# Patient Record
Sex: Male | Born: 1955 | Race: Black or African American | Hispanic: No | Marital: Single | Smoking: Never smoker
Health system: Southern US, Community
[De-identification: ages and names within clinical notes are randomized; demographics above are authoritative.]

## PROBLEM LIST (undated history)

## (undated) DIAGNOSIS — F419 Anxiety disorder, unspecified: Secondary | ICD-10-CM

## (undated) DIAGNOSIS — F200 Paranoid schizophrenia: Secondary | ICD-10-CM

## (undated) DIAGNOSIS — I639 Cerebral infarction, unspecified: Secondary | ICD-10-CM

## (undated) HISTORY — PX: WISDOM TOOTH EXTRACTION: SHX21

## (undated) HISTORY — DX: Anxiety disorder, unspecified: F41.9

## (undated) HISTORY — DX: Paranoid schizophrenia: F20.0

---

## 2005-03-11 ENCOUNTER — Emergency Department (HOSPITAL_COMMUNITY): Admission: EM | Admit: 2005-03-11 | Discharge: 2005-03-11 | Payer: Self-pay | Admitting: Emergency Medicine

## 2005-03-13 ENCOUNTER — Emergency Department (HOSPITAL_COMMUNITY): Admission: EM | Admit: 2005-03-13 | Discharge: 2005-03-13 | Payer: Self-pay | Admitting: Emergency Medicine

## 2005-04-05 ENCOUNTER — Emergency Department (HOSPITAL_COMMUNITY): Admission: EM | Admit: 2005-04-05 | Discharge: 2005-04-05 | Payer: Self-pay | Admitting: Emergency Medicine

## 2005-04-08 ENCOUNTER — Emergency Department (HOSPITAL_COMMUNITY): Admission: EM | Admit: 2005-04-08 | Discharge: 2005-04-08 | Payer: Self-pay | Admitting: Emergency Medicine

## 2005-04-12 ENCOUNTER — Encounter (HOSPITAL_COMMUNITY): Admission: RE | Admit: 2005-04-12 | Discharge: 2005-05-07 | Payer: Self-pay | Admitting: Emergency Medicine

## 2005-12-05 ENCOUNTER — Ambulatory Visit: Payer: Self-pay | Admitting: *Deleted

## 2005-12-05 ENCOUNTER — Ambulatory Visit: Payer: Self-pay | Admitting: Internal Medicine

## 2009-01-21 ENCOUNTER — Encounter (INDEPENDENT_AMBULATORY_CARE_PROVIDER_SITE_OTHER): Payer: Self-pay | Admitting: Adult Health

## 2009-01-21 ENCOUNTER — Ambulatory Visit: Payer: Self-pay | Admitting: Internal Medicine

## 2009-01-21 LAB — CONVERTED CEMR LAB
ALT: 62 units/L — ABNORMAL HIGH (ref 0–53)
Albumin: 4.2 g/dL (ref 3.5–5.2)
BUN: 12 mg/dL (ref 6–23)
Chlamydia, Swab/Urine, PCR: NEGATIVE
Chloride: 105 meq/L (ref 96–112)
Creatinine, Ser: 0.74 mg/dL (ref 0.40–1.50)
GC Probe Amp, Urine: NEGATIVE
Glucose, Bld: 93 mg/dL (ref 70–99)
Potassium: 5.2 meq/L (ref 3.5–5.3)
Sodium: 142 meq/L (ref 135–145)

## 2009-09-22 ENCOUNTER — Ambulatory Visit (HOSPITAL_COMMUNITY): Admission: RE | Admit: 2009-09-22 | Discharge: 2009-09-22 | Payer: Self-pay | Admitting: Internal Medicine

## 2009-09-22 ENCOUNTER — Ambulatory Visit: Payer: Self-pay | Admitting: Family Medicine

## 2009-10-10 ENCOUNTER — Ambulatory Visit: Payer: Self-pay | Admitting: Internal Medicine

## 2010-01-10 ENCOUNTER — Ambulatory Visit: Payer: Self-pay | Admitting: Internal Medicine

## 2010-01-11 ENCOUNTER — Encounter (INDEPENDENT_AMBULATORY_CARE_PROVIDER_SITE_OTHER): Payer: Self-pay | Admitting: Adult Health

## 2010-01-11 LAB — CONVERTED CEMR LAB
Alkaline Phosphatase: 64 units/L (ref 39–117)
BUN: 12 mg/dL (ref 6–23)
Glucose, Bld: 77 mg/dL (ref 70–99)
HCV Quantitative: 394000 intl units/mL — ABNORMAL HIGH (ref <43)
LDL Cholesterol: 94 mg/dL (ref 0–99)
Total Bilirubin: 1.2 mg/dL (ref 0.3–1.2)
VLDL: 15 mg/dL (ref 0–40)

## 2011-10-09 HISTORY — PX: COLONOSCOPY: SHX174

## 2012-03-14 ENCOUNTER — Encounter: Payer: Self-pay | Admitting: Internal Medicine

## 2012-04-07 ENCOUNTER — Encounter: Payer: Self-pay | Admitting: Internal Medicine

## 2012-04-07 ENCOUNTER — Ambulatory Visit (INDEPENDENT_AMBULATORY_CARE_PROVIDER_SITE_OTHER): Payer: Managed Care, Other (non HMO) | Admitting: Internal Medicine

## 2012-04-07 VITALS — BP 100/70 | HR 80 | Ht 69.0 in | Wt 135.6 lb

## 2012-04-07 DIAGNOSIS — R195 Other fecal abnormalities: Secondary | ICD-10-CM

## 2012-04-07 DIAGNOSIS — Z1211 Encounter for screening for malignant neoplasm of colon: Secondary | ICD-10-CM

## 2012-04-07 DIAGNOSIS — M545 Low back pain: Secondary | ICD-10-CM | POA: Insufficient documentation

## 2012-04-07 MED ORDER — PEG-KCL-NACL-NASULF-NA ASC-C 100 G PO SOLR: 1.0000 | Freq: Once | ORAL | Status: DC

## 2012-04-07 NOTE — Progress Notes (Signed)
Patient ID: Curtis Mcdonald, male   DOB: [birth year], 55 y.o.   MRN: [id number]  SUBJECTIVE: HPI Mr. Curtis Mcdonald is a 56 year old male with a PMH of lower back pain who seen in consultation at the request of Dr. Roseanne Reno for evaluation of positive FOBT. The patient reports no GI complaints. He reports he feels well. No blood in his stool or melena. No abdominal pain. No change in bowel habits. No nausea or vomiting. No heartburn. No dysphagia or odynophagia. No unintentional weight loss. No fevers or chills. No prior history of colonoscopy.  Review of Systems  As per history of present illness, otherwise negative   PMH Lumbar back pain  Current Outpatient Prescriptions  Medication Sig Dispense Refill  . cyclobenzaprine (FLEXERIL) 10 MG tablet Take 10 mg by mouth 3 (three) times daily as needed.      . naproxen (NAPROSYN) 500 MG tablet Take 500 mg by mouth 2 (two) times daily with a meal.      . peg 3350 powder (MOVIPREP) 100 G SOLR Take 1 kit (100 g total) by mouth once.  1 kit  0  . traMADol (ULTRAM) 50 MG tablet Take 50 mg by mouth every 6 (six) hours as needed.        No Known Allergies  FH - neg for known hx of CRC, polyps  History  Substance Use Topics  . Smoking status: Never Smoker   . Smokeless tobacco: Never Used  . Alcohol Use: No    OBJECTIVE: BP 100/70  Pulse 80  Ht 5\' 9"  (1.753 m)  Wt 135 lb 9.6 oz (61.508 kg)  BMI 20.02 kg/m2 Constitutional: Well-developed and well-nourished. No distress. HEENT: Normocephalic and atraumatic. Oropharynx is clear and moist. No oropharyngeal exudate. Conjunctivae are normal. Pupils are equal round and reactive to light. No scleral icterus. Neck: Neck supple. Trachea midline. Cardiovascular: Normal rate, regular rhythm and intact distal pulses. No M/R/G Pulmonary/chest: Effort normal and breath sounds normal. No wheezing, rales or rhonchi. Abdominal: Soft, nontender, nondistended. Bowel sounds active throughout. There are no masses palpable. No  hepatosplenomegaly. Extremities: no clubbing, cyanosis, or edema Lymphadenopathy: No cervical adenopathy noted. Neurological: Alert and oriented to person place and time. Skin: Skin is warm and dry. No rashes noted. Psychiatric: Normal mood and affect. Behavior is normal.  Labs and Imaging -last obtained from primary care dated 02/19/2012 Hemoglobin 13.8, hematocrit 41.5, platelets 232, WBC 4.5, MCV 97.6 CMP unremarkable FOBT positive x1  ASSESSMENT AND PLAN:  56 year old male with a PMH of lower back pain who seen in consultation at the request of Dr. Roseanne Reno for evaluation of positive FOBT.  1. + FOBT -- the patient is average risk for colorectal cancer screening, however he has had a positive FOBT. I recommended colonoscopy and he is agreeable to proceed. We discussed the risks and benefits in clinic. He does use NSAIDs and so perhaps this could cause GI irritation and positive FOBT, however colonoscopy is warranted before arriving at such a hypothesis.  Followup as needed

## 2012-04-07 NOTE — Patient Instructions (Addendum)
You have been scheduled for a colonoscopy with propofol. Please follow written instructions given to you at your visit today.  Please pick up your prep kit at the pharmacy within the next 1-3 days.  

## 2012-05-08 ENCOUNTER — Ambulatory Visit (AMBULATORY_SURGERY_CENTER): Payer: Managed Care, Other (non HMO) | Admitting: Internal Medicine

## 2012-05-08 ENCOUNTER — Encounter: Payer: Self-pay | Admitting: Internal Medicine

## 2012-05-08 VITALS — BP 126/69 | HR 77 | Temp 97.8°F | Resp 14 | Ht 69.0 in | Wt 135.0 lb

## 2012-05-08 DIAGNOSIS — Z1211 Encounter for screening for malignant neoplasm of colon: Secondary | ICD-10-CM

## 2012-05-08 DIAGNOSIS — R195 Other fecal abnormalities: Secondary | ICD-10-CM

## 2012-05-08 MED ORDER — SODIUM CHLORIDE 0.9 % IV SOLN: 500.0000 mL | INTRAVENOUS | Status: DC

## 2012-05-08 NOTE — Patient Instructions (Addendum)
Handouts were given to your care partner on high fiber diet and hemorrhoids.  You may resume your medications today.  Please call if any questions or concerns.    YOU HAD AN ENDOSCOPIC PROCEDURE TODAY AT THE [facility]: Refer to the procedure report that was given to you for any specific questions about what was found during the examination.  If the procedure report does not answer your questions, please call your gastroenterologist to clarify.  If you requested that your care partner not be given the details of your procedure findings, then the procedure report has been included in a sealed envelope for you to review at your convenience later.  YOU SHOULD EXPECT: Some feelings of bloating in the abdomen. Passage of more gas than usual.  Walking can help get rid of the air that was put into your GI tract during the procedure and reduce the bloating. If you had a lower endoscopy (such as a colonoscopy or flexible sigmoidoscopy) you may notice spotting of blood in your stool or on the toilet paper. If you underwent a bowel prep for your procedure, then you may not have a normal bowel movement for a few days.  DIET: Your first meal following the procedure should be a light meal and then it is ok to progress to your normal diet.  A half-sandwich or bowl of soup is an example of a good first meal.  Heavy or fried foods are harder to digest and may make you feel nauseous or bloated.  Likewise meals heavy in dairy and vegetables can cause extra gas to form and this can also increase the bloating.  Drink plenty of fluids but you should avoid alcoholic beverages for 24 hours.  ACTIVITY: Your care partner should take you home directly after the procedure.  You should plan to take it easy, moving slowly for the rest of the day.  You can resume normal activity the day after the procedure however you should NOT DRIVE or use heavy machinery for 24 hours (because of the sedation medicines used during the  test).    SYMPTOMS TO REPORT IMMEDIATELY: A gastroenterologist can be reached at any hour.  During normal business hours, 8:30 AM to 5:00 PM Monday through Friday, call [phone].  After hours and on weekends, please call the GI answering service at [phone] who will take a message and have the physician on call contact you.   Following lower endoscopy (colonoscopy or flexible sigmoidoscopy):  Excessive amounts of blood in the stool  Significant tenderness or worsening of abdominal pains  Swelling of the abdomen that is new, acute  Fever of 100F or higher    FOLLOW UP: If any biopsies were taken you will be contacted by phone or by letter within the next 1-3 weeks.  Call your gastroenterologist if you have not heard about the biopsies in 3 weeks.  Our staff will call the home number listed on your records the next business day following your procedure to check on you and address any questions or concerns that you may have at that time regarding the information given to you following your procedure. This is a courtesy call and so if there is no answer at the home number and we have not heard from you through the emergency physician on call, we will assume that you have returned to your regular daily activities without incident.  SIGNATURES/CONFIDENTIALITY: You and/or your care partner have signed paperwork which will be entered into your electronic  medical record.  These signatures attest to the fact that that the information above on your After Visit Summary has been reviewed and is understood.  Full responsibility of the confidentiality of this discharge information lies with you and/or your care-partner.  

## 2012-05-08 NOTE — Progress Notes (Signed)
Patient did not experience any of the following events: a burn prior to discharge; a fall within the facility; wrong site/side/patient/procedure/implant event; or a hospital transfer or hospital admission upon discharge from the facility. (G8907) Patient did not have preoperative order for IV antibiotic SSI prophylaxis. (G8918)  

## 2012-05-08 NOTE — Op Note (Signed)
[facility] [address] [place], [place]  [address]  COLONOSCOPY PROCEDURE REPORT  PATIENT:  Curtis Mcdonald, Curtis Mcdonald  MR#:  [id number] BIRTHDATE:  1955-11-04, 55 yrs. old  GENDER:  male ENDOSCOPIST:  Carie Caddy. Chip Canepa, MD REF. BY:  Quitman Livings, M.D. PROCEDURE DATE:  05/08/2012 PROCEDURE:  Colonoscopy 19147 ASA CLASS:  Class II INDICATIONS:  heme positive stool, 1st colonoscopy MEDICATIONS:   MAC sedation, administered by CRNA, propofol (Diprivan) 300 mg IV  DESCRIPTION OF PROCEDURE:   After the risks benefits and alternatives of the procedure were thoroughly explained, informed consent was obtained.  Digital rectal exam was performed and revealed no rectal masses.   The LB [id number] endoscope was introduced through the anus and advanced to the cecum, which was identified by both the appendix and ileocecal valve, without limitations.  The quality of the prep was good, using MoviPrep. The instrument was then slowly withdrawn as the colon was fully examined. <<PROCEDUREIMAGES>>  FINDINGS:  A normal appearing cecum, ileocecal valve, and appendiceal orifice were identified. The ascending, hepatic flexure, transverse, splenic flexure, descending, sigmoid colon, and rectum appeared unremarkable.  Retroflexed viewed in the cecum/right colon were unremarkable.  Retroflexed views in the rectum revealed internal hemorrhoids.  The scope was then withdrawn  from the cecum and the procedure completed.  COMPLICATIONS:  None  ENDOSCOPIC IMPRESSION: 1) Normal colon 2) Internal hemorrhoids  RECOMMENDATIONS: 1) You should continue to follow colorectal cancer screening guidelines for "routine risk" patients with a repeat colonoscopy in 10 years. There is no need for FOBT (stool) testing for at least 5 years.  Carie Caddy. Rhea Belton, MD  CC:  The Patient Quitman Livings MD  n. Rosalie DoctorCarie Caddy. Hamdi Kley at 05/08/2012 02:06 PM  Fernand Parkins, [phone]

## 2012-05-08 NOTE — Progress Notes (Signed)
No complaints in the recovery room noted. Maw

## 2012-05-09 ENCOUNTER — Telehealth: Payer: Self-pay | Admitting: *Deleted

## 2012-05-09 NOTE — Telephone Encounter (Signed)
  Left message on answering machine to give [place] a call if he is experiencing any problems or has questions

## 2015-10-09 DIAGNOSIS — I639 Cerebral infarction, unspecified: Secondary | ICD-10-CM

## 2015-10-09 HISTORY — DX: Cerebral infarction, unspecified: I63.9

## 2016-04-11 ENCOUNTER — Encounter (HOSPITAL_COMMUNITY): Payer: Self-pay

## 2016-04-11 ENCOUNTER — Emergency Department (HOSPITAL_COMMUNITY): Payer: Self-pay

## 2016-04-11 ENCOUNTER — Inpatient Hospital Stay (HOSPITAL_COMMUNITY)
Admission: EM | Admit: 2016-04-11 | Discharge: 2016-04-13 | DRG: 084 | Disposition: A | Discharge status: Home Health | Payer: Self-pay | Attending: Internal Medicine | Admitting: Internal Medicine

## 2016-04-11 DIAGNOSIS — S06369A Traumatic hemorrhage of cerebrum, unspecified, with loss of consciousness of unspecified duration, initial encounter: Principal | ICD-10-CM | POA: Diagnosis present

## 2016-04-11 DIAGNOSIS — F209 Schizophrenia, unspecified: Secondary | ICD-10-CM | POA: Diagnosis present

## 2016-04-11 DIAGNOSIS — Z23 Encounter for immunization: Secondary | ICD-10-CM

## 2016-04-11 DIAGNOSIS — R569 Unspecified convulsions: Secondary | ICD-10-CM | POA: Diagnosis present

## 2016-04-11 DIAGNOSIS — I629 Nontraumatic intracranial hemorrhage, unspecified: Secondary | ICD-10-CM

## 2016-04-11 DIAGNOSIS — S0081XA Abrasion of other part of head, initial encounter: Secondary | ICD-10-CM | POA: Diagnosis present

## 2016-04-11 DIAGNOSIS — Z8249 Family history of ischemic heart disease and other diseases of the circulatory system: Secondary | ICD-10-CM

## 2016-04-11 DIAGNOSIS — Z79899 Other long term (current) drug therapy: Secondary | ICD-10-CM

## 2016-04-11 DIAGNOSIS — Z8673 Personal history of transient ischemic attack (TIA), and cerebral infarction without residual deficits: Secondary | ICD-10-CM

## 2016-04-11 DIAGNOSIS — B182 Chronic viral hepatitis C: Secondary | ICD-10-CM | POA: Diagnosis present

## 2016-04-11 DIAGNOSIS — D6959 Other secondary thrombocytopenia: Secondary | ICD-10-CM | POA: Diagnosis present

## 2016-04-11 DIAGNOSIS — R740 Nonspecific elevation of levels of transaminase and lactic acid dehydrogenase [LDH]: Secondary | ICD-10-CM | POA: Diagnosis present

## 2016-04-11 DIAGNOSIS — S022XXA Fracture of nasal bones, initial encounter for closed fracture: Secondary | ICD-10-CM | POA: Diagnosis present

## 2016-04-11 DIAGNOSIS — R411 Anterograde amnesia: Secondary | ICD-10-CM | POA: Diagnosis present

## 2016-04-11 DIAGNOSIS — W19XXXA Unspecified fall, initial encounter: Secondary | ICD-10-CM | POA: Diagnosis present

## 2016-04-11 DIAGNOSIS — E869 Volume depletion, unspecified: Secondary | ICD-10-CM | POA: Diagnosis present

## 2016-04-11 DIAGNOSIS — R55 Syncope and collapse: Secondary | ICD-10-CM | POA: Diagnosis present

## 2016-04-11 DIAGNOSIS — R4020 Unspecified coma: Secondary | ICD-10-CM

## 2016-04-11 DIAGNOSIS — S00511A Abrasion of lip, initial encounter: Secondary | ICD-10-CM | POA: Diagnosis present

## 2016-04-11 LAB — URINALYSIS, ROUTINE W REFLEX MICROSCOPIC
BILIRUBIN URINE: NEGATIVE
Glucose, UA: NEGATIVE mg/dL
Hgb urine dipstick: NEGATIVE
Ketones, ur: NEGATIVE mg/dL
LEUKOCYTES UA: NEGATIVE
NITRITE: NEGATIVE
Protein, ur: NEGATIVE mg/dL
SPECIFIC GRAVITY, URINE: 1.01 (ref 1.005–1.030)
pH: 6 (ref 5.0–8.0)

## 2016-04-11 LAB — CARBOXYHEMOGLOBIN
Carboxyhemoglobin: 1.4 % (ref 0.5–1.5)
METHEMOGLOBIN: 0.6 % (ref 0.0–1.5)
O2 SAT: 96.9 %
TOTAL HEMOGLOBIN: 14 g/dL (ref 13.5–18.0)

## 2016-04-11 LAB — RAPID URINE DRUG SCREEN, HOSP PERFORMED
AMPHETAMINES: NOT DETECTED
Barbiturates: NOT DETECTED
Benzodiazepines: NOT DETECTED
Cocaine: NOT DETECTED
Opiates: NOT DETECTED
Tetrahydrocannabinol: NOT DETECTED

## 2016-04-11 LAB — CBC
HEMATOCRIT: 41.7 % (ref 39.0–52.0)
HEMOGLOBIN: 14.2 g/dL (ref 13.0–17.0)
MCH: 33.3 pg (ref 26.0–34.0)
MCHC: 34.1 g/dL (ref 30.0–36.0)
MCV: 97.9 fL (ref 78.0–100.0)
Platelets: 116 10*3/uL — ABNORMAL LOW (ref 150–400)
RBC: 4.26 MIL/uL (ref 4.22–5.81)
RDW: 12.1 % (ref 11.5–15.5)
WBC: 3.4 10*3/uL — AB (ref 4.0–10.5)

## 2016-04-11 LAB — HEPATIC FUNCTION PANEL
ALBUMIN: 3.4 g/dL — AB (ref 3.5–5.0)
ALT: 50 U/L (ref 17–63)
AST: 61 U/L — AB (ref 15–41)
Alkaline Phosphatase: 63 U/L (ref 38–126)
BILIRUBIN DIRECT: 0.4 mg/dL (ref 0.1–0.5)
BILIRUBIN TOTAL: 0.6 mg/dL (ref 0.3–1.2)
Indirect Bilirubin: 0.2 mg/dL — ABNORMAL LOW (ref 0.3–0.9)
Total Protein: 6.6 g/dL (ref 6.5–8.1)

## 2016-04-11 LAB — BASIC METABOLIC PANEL
ANION GAP: 7 (ref 5–15)
BUN: 7 mg/dL (ref 6–20)
CO2: 26 mmol/L (ref 22–32)
Calcium: 8.9 mg/dL (ref 8.9–10.3)
Chloride: 104 mmol/L (ref 101–111)
Creatinine, Ser: 0.92 mg/dL (ref 0.61–1.24)
Glucose, Bld: 125 mg/dL — ABNORMAL HIGH (ref 65–99)
POTASSIUM: 3.9 mmol/L (ref 3.5–5.1)
SODIUM: 137 mmol/L (ref 135–145)

## 2016-04-11 LAB — I-STAT TROPONIN, ED: TROPONIN I, POC: 0.01 ng/mL (ref 0.00–0.08)

## 2016-04-11 LAB — TROPONIN I: Troponin I: <0.03 ng/mL (ref <0.03)

## 2016-04-11 LAB — ETHANOL

## 2016-04-11 MED ORDER — TRAZODONE HCL 100 MG PO TABS
100.0000 mg | ORAL_TABLET | Freq: Every day | ORAL | Status: DC
  Administered 2016-04-11 – 2016-04-12 (×2): 100 mg via ORAL
  Filled 2016-04-11 (×2): qty 1

## 2016-04-11 MED ORDER — SODIUM CHLORIDE 0.9 % IV SOLN
INTRAVENOUS | Status: AC
  Administered 2016-04-11: 23:00:00 via INTRAVENOUS

## 2016-04-11 MED ORDER — ACETAMINOPHEN 325 MG PO TABS: 650.0000 mg | ORAL_TABLET | Freq: Four times a day (QID) | ORAL | Status: DC | PRN

## 2016-04-11 MED ORDER — SODIUM CHLORIDE 0.9% FLUSH
3.0000 mL | Freq: Two times a day (BID) | INTRAVENOUS | Status: DC
  Administered 2016-04-11 – 2016-04-12 (×2): 3 mL via INTRAVENOUS
  Administered 2016-04-12: 10 mL via INTRAVENOUS
  Administered 2016-04-13: 3 mL via INTRAVENOUS

## 2016-04-11 MED ORDER — ONDANSETRON HCL 4 MG PO TABS: 4.0000 mg | ORAL_TABLET | Freq: Four times a day (QID) | ORAL | Status: DC | PRN

## 2016-04-11 MED ORDER — LURASIDONE HCL 40 MG PO TABS
40.0000 mg | ORAL_TABLET | Freq: Every day | ORAL | Status: DC
  Administered 2016-04-12 – 2016-04-13 (×2): 40 mg via ORAL
  Filled 2016-04-11 (×2): qty 1

## 2016-04-11 MED ORDER — ONDANSETRON HCL 4 MG/2ML IJ SOLN: 4.0000 mg | Freq: Four times a day (QID) | INTRAMUSCULAR | Status: DC | PRN

## 2016-04-11 MED ORDER — SODIUM CHLORIDE 0.9% FLUSH
3.0000 mL | Freq: Two times a day (BID) | INTRAVENOUS | Status: DC
  Administered 2016-04-11 – 2016-04-13 (×3): 3 mL via INTRAVENOUS

## 2016-04-11 MED ORDER — ACETAMINOPHEN 650 MG RE SUPP: 650.0000 mg | Freq: Four times a day (QID) | RECTAL | Status: DC | PRN

## 2016-04-11 MED ORDER — TETANUS-DIPHTH-ACELL PERTUSSIS 5-2.5-18.5 LF-MCG/0.5 IM SUSP
0.5000 mL | Freq: Once | INTRAMUSCULAR | Status: AC
  Administered 2016-04-11: 0.5 mL via INTRAMUSCULAR
  Filled 2016-04-11: qty 0.5

## 2016-04-11 NOTE — ED Notes (Signed)
Pt hooked up to monitors 

## 2016-04-11 NOTE — ED Notes (Signed)
Per EMS, pt was at a garage working on his car and had an unwitnessed fall with contusion to left occipital. Pt was found by bystander on the ground shaking. Unclear story from bystander. Denies pain. Pt altered on scene with EMS. Oriented to self only. Pt is alert here. EMS placed in c-collar by EMS. Pt has abrasion to left eye brow and on lip. Pt denies blood thinners or any other medications. Denies medical hx. Airway intact.
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
## 2016-04-11 NOTE — ED Provider Notes (Signed)
CSN: [id number]     Arrival date & time 04/11/16  1534 History   First MD Initiated Contact with Patient 04/11/16 1540     Chief Complaint  Patient presents with  . Seizures     (Consider location/radiation/quality/duration/timing/severity/associated sxs/prior Treatment) Patient is a 60 y.o. male presenting with seizures. The history is provided by the patient and medical records.  Seizures  60 y.o. M with no significant PMH Presenting to the ED via EMS from garage. History limited as patient is not able to provide significant details. Her bystander, patient was found on the ground, halfway under a car. He was unconscious and appeared to be convulsing.  It was presumed he had some type of fall, however this was unwitnessed.  With EMS, patient was oriented to self only.  On arrival here, patient is AAOx3.  He states he cannot remember why he was in the garage in the first place or what he was doing.  He states he does not remember feeling dizzy or that he was going to pass out.  He has no hx of seizure disorder.  Denies drugs or alcohol use today.  Patient does have abrasions to his left eyebrow and lower lip as well as hematoma to left occipital region.  EMS reported some urinary incontinence.  Not currently on anti-coagulation.  Date of last tetanus unknown.  History reviewed. No pertinent past medical history. Past Surgical History  Procedure Laterality Date  . Wisdom tooth extraction     History reviewed. No pertinent family history. Social History  Substance Use Topics  . Smoking status: Never Smoker   . Smokeless tobacco: Never Used  . Alcohol Use: No    Review of Systems  Neurological: Positive for seizures.  All other systems reviewed and are negative.     Allergies  Review of patient's allergies indicates no known allergies.  Home Medications   Prior to Admission medications   Medication Sig Start Date End Date Taking? Authorizing Provider  cyclobenzaprine (FLEXERIL)  10 MG tablet Take 10 mg by mouth 3 (three) times daily as needed.    Historical Provider, MD  naproxen (NAPROSYN) 500 MG tablet Take 500 mg by mouth 2 (two) times daily with a meal.    Historical Provider, MD  traMADol (ULTRAM) 50 MG tablet Take 50 mg by mouth every 6 (six) hours as needed.    Historical Provider, MD   BP 112/73 mmHg  Pulse 79  Resp 15  SpO2 99%   Physical Exam  Constitutional: He is oriented to person, place, and time. He appears well-developed and well-nourished. No distress.  HENT:  Head: Normocephalic. Head is with abrasion.  Mouth/Throat: Oropharynx is clear and moist.  Abrasion noted to left eyebrow and lower lip; hematoma to left occipital scalp; no tongue laceration noted; dentition intact; mid-face stable  Eyes: Conjunctivae and EOM are normal. Pupils are equal, round, and reactive to light.  Pupils symmetric and reactive bilaterally  Neck: Normal range of motion.  Cardiovascular: Normal rate, regular rhythm and normal heart sounds.   Pulmonary/Chest: Effort normal and breath sounds normal. No respiratory distress. He has no wheezes.  Abdominal: Soft. Bowel sounds are normal. There is no tenderness. There is no guarding.  Musculoskeletal: Normal range of motion. He exhibits no edema.  Abrasion to left elbow, non-tender, no swelling or deformities; full ROM maintained  Neurological: He is alert and oriented to person, place, and time.  AAOx3 currently, cannot recall why he was in the garage earlier today  or what he was doing; equal strength UE and LE bilaterally; CN grossly intact; moves all extremities appropriately without ataxia; no focal neuro deficits or facial asymmetry appreciated  Skin: Skin is warm and dry. He is not diaphoretic.  Psychiatric: He has a normal mood and affect.  Nursing note and vitals reviewed.   ED Course  Procedures (including critical care time) Labs Review Labs Reviewed  BASIC METABOLIC PANEL - Abnormal; Notable for the  following:    Glucose, Bld 125 (*)    All other components within normal limits  CBC - Abnormal; Notable for the following:    WBC 3.4 (*)    Platelets 116 (*)    All other components within normal limits  URINALYSIS, ROUTINE W REFLEX MICROSCOPIC (NOT AT [facility]) - Abnormal; Notable for the following:    APPearance HAZY (*)    All other components within normal limits  HEPATIC FUNCTION PANEL - Abnormal; Notable for the following:    Albumin 3.4 (*)    AST 61 (*)    Indirect Bilirubin 0.2 (*)    All other components within normal limits  CARBOXYHEMOGLOBIN  URINE RAPID DRUG SCREEN, HOSP PERFORMED  ETHANOL  TROPONIN I  MAGNESIUM  PHOSPHORUS  TSH  COMPREHENSIVE METABOLIC PANEL  CBC  TROPONIN I  TROPONIN I  HEMOGLOBIN A1C  LIPID PANEL  CBG MONITORING, ED  I-STAT TROPOININ, ED    Imaging Review Ct Head Wo Contrast  04/11/2016  CLINICAL DATA:  Status post seizure episode today with a fall pain contusion to the posterior aspect of the head on the left. Altered mental status. Abrasion above the left eyebrow and on the lip. Initial encounter. EXAM: CT HEAD WITHOUT CONTRAST CT MAXILLOFACIAL WITHOUT CONTRAST CT CERVICAL SPINE WITHOUT CONTRAST TECHNIQUE: Multidetector CT imaging of the head, cervical spine, and maxillofacial structures were performed using the standard protocol without intravenous contrast. Multiplanar CT image reconstructions of the cervical spine and maxillofacial structures were also generated. COMPARISON:  None. FINDINGS: CT HEAD FINDINGS A small amount of hemorrhage is seen layering within the posterior horns of the lateral ventricles bilaterally, greater on the left. No subdural hematoma is identified. Remote lacunar infarction in the right thalamus is seen. No acute infarct, mass lesion, midline shift, hydrocephalus or pneumocephalus is identified. Calvarium is intact. Soft tissue contusion over the left occipital bone is noted. CT MAXILLOFACIAL FINDINGS The patient has a  mildly depressed right nasal bone fracture. No other facial bone fracture is seen. Paranasal sinuses and mastoid air cells are clear. The globes are intact and the lenses are located. Orbital fat is clear. CT CERVICAL SPINE FINDINGS No cervical spine fracture or malalignment is identified. Intervertebral disc space height is maintained. The lung apices are clear. IMPRESSION: A small amount of acute hemorrhage is seen within the posterior horns of the lateral ventricles bilaterally, greater on the left. Small soft tissue contusion over the left occipital bone without underlying fracture also identified. Remote lacunar infarction right thalamus. Nondisplaced right nasal bone fracture. Negative cervical spine CT scan. Critical Value/emergent results were called by telephone at the time of interpretation on 04/11/2016 at 5:37 pm to Sharilyn SitesLISA Jaymarion Trombly, P.A., who verbally acknowledged these results. Electronically Signed   By: Drusilla Kannerhomas  Dalessio M.D.   On: 04/11/2016 17:39   Ct Cervical Spine Wo Contrast  04/11/2016  CLINICAL DATA:  Status post seizure episode today with a fall pain contusion to the posterior aspect of the head on the left. Altered mental status. Abrasion above the left eyebrow  and on the lip. Initial encounter. EXAM: CT HEAD WITHOUT CONTRAST CT MAXILLOFACIAL WITHOUT CONTRAST CT CERVICAL SPINE WITHOUT CONTRAST TECHNIQUE: Multidetector CT imaging of the head, cervical spine, and maxillofacial structures were performed using the standard protocol without intravenous contrast. Multiplanar CT image reconstructions of the cervical spine and maxillofacial structures were also generated. COMPARISON:  None. FINDINGS: CT HEAD FINDINGS A small amount of hemorrhage is seen layering within the posterior horns of the lateral ventricles bilaterally, greater on the left. No subdural hematoma is identified. Remote lacunar infarction in the right thalamus is seen. No acute infarct, mass lesion, midline shift, hydrocephalus or  pneumocephalus is identified. Calvarium is intact. Soft tissue contusion over the left occipital bone is noted. CT MAXILLOFACIAL FINDINGS The patient has a mildly depressed right nasal bone fracture. No other facial bone fracture is seen. Paranasal sinuses and mastoid air cells are clear. The globes are intact and the lenses are located. Orbital fat is clear. CT CERVICAL SPINE FINDINGS No cervical spine fracture or malalignment is identified. Intervertebral disc space height is maintained. The lung apices are clear. IMPRESSION: A small amount of acute hemorrhage is seen within the posterior horns of the lateral ventricles bilaterally, greater on the left. Small soft tissue contusion over the left occipital bone without underlying fracture also identified. Remote lacunar infarction right thalamus. Nondisplaced right nasal bone fracture. Negative cervical spine CT scan. Critical Value/emergent results were called by telephone at the time of interpretation on 04/11/2016 at 5:37 pm to Sharilyn Sites, P.A., who verbally acknowledged these results. Electronically Signed   By: Drusilla Kanner M.D.   On: 04/11/2016 17:39   Ct Maxillofacial Wo Cm  04/11/2016  CLINICAL DATA:  Status post seizure episode today with a fall pain contusion to the posterior aspect of the head on the left. Altered mental status. Abrasion above the left eyebrow and on the lip. Initial encounter. EXAM: CT HEAD WITHOUT CONTRAST CT MAXILLOFACIAL WITHOUT CONTRAST CT CERVICAL SPINE WITHOUT CONTRAST TECHNIQUE: Multidetector CT imaging of the head, cervical spine, and maxillofacial structures were performed using the standard protocol without intravenous contrast. Multiplanar CT image reconstructions of the cervical spine and maxillofacial structures were also generated. COMPARISON:  None. FINDINGS: CT HEAD FINDINGS A small amount of hemorrhage is seen layering within the posterior horns of the lateral ventricles bilaterally, greater on the left. No  subdural hematoma is identified. Remote lacunar infarction in the right thalamus is seen. No acute infarct, mass lesion, midline shift, hydrocephalus or pneumocephalus is identified. Calvarium is intact. Soft tissue contusion over the left occipital bone is noted. CT MAXILLOFACIAL FINDINGS The patient has a mildly depressed right nasal bone fracture. No other facial bone fracture is seen. Paranasal sinuses and mastoid air cells are clear. The globes are intact and the lenses are located. Orbital fat is clear. CT CERVICAL SPINE FINDINGS No cervical spine fracture or malalignment is identified. Intervertebral disc space height is maintained. The lung apices are clear. IMPRESSION: A small amount of acute hemorrhage is seen within the posterior horns of the lateral ventricles bilaterally, greater on the left. Small soft tissue contusion over the left occipital bone without underlying fracture also identified. Remote lacunar infarction right thalamus. Nondisplaced right nasal bone fracture. Negative cervical spine CT scan. Critical Value/emergent results were called by telephone at the time of interpretation on 04/11/2016 at 5:37 pm to Sharilyn Sites, P.A., who verbally acknowledged these results. Electronically Signed   By: Drusilla Kanner M.D.   On: 04/11/2016 17:39   I have  personally reviewed and evaluated these images and lab results as part of my medical decision-making.   EKG Interpretation   Date/Time:  Wednesday April 11 2016 15:35:00 EDT Ventricular Rate:  84 PR Interval:    QRS Duration: 76 QT Interval:  386 QTC Calculation: 457 R Axis:   27 Text Interpretation:  Sinus rhythm Right atrial enlargement No previous  ECGs available Confirmed by Manus Gunning  MD, STEPHEN [phone]) on 04/11/2016  3:50:03 PM      MDM   Final diagnoses:  Intracranial hemorrhage (HCC)  Fall, initial encounter   60 y.o. M here with questionable seizure.  He was found down halfway under a vehicle after an alleged fall,  however this was unwitnessed.  Bystander reported he was "convulsing" however history is somewhat unclear about actual events.  Patient is AAOx3 on arrival to ED, however he cannot recall why he was in the garage, what he was doing, or how he ended up on the ground.  Denies EtOH or illicit drugs today.  Does have some signs of head trauma on exam.  Denies seizure hx.  No anti-coagulants use.  Will obtains labs, head CT/max-face/CS.    Labs overall reassuring.  CT head with acute hemorrhage in posterior horns of lateral ventricles bilaterally, L > R.  No acute skull fracture.  Does have non-displaced right nasal fracture-- will need OP ENT follow-up for this.  Will consult neurosurgery.  Spoke with neurosurgery, Dr. Conchita Paris-- no surgical intervention needed.  May need neurology input regarding questionable seizure.  Spoke with neurology, Dr. Amada Jupiter-- does not recommend ppx seizure meds at this time given setting of traumatic injury.  Patient does not have any family in the area, currently lives alone.  Hospitalist, Dr. Adela Glimpse has evaluated patient in the ED, will admit for further management/monitoring.  Garlon Hatchet, PA-C 04/11/16 6045  Lyndal Pulley, MD 04/12/16 [phone]

## 2016-04-11 NOTE — Consult Note (Signed)
Neurology Consultation Reason for Consult: Possible seizure Referring Physician: Sharilyn SitesSanders, Lisa  CC: Loss of consciousness  History is obtained from: Patient, chart  HPI: Curtis Mcdonald is a 60 y.o. male who states that he was getting his car inspected when he suddenly lost consciousness. Apparently, there is question of whether he might have been convulsing versus posturing on arrival. There is no witness to the onset of events. He was seen to have abrasions of his left eyebrow and lower lip and a hematoma in the left occiput. CT scan was performed which shows a small amount of intraventricular blood.  He has since continued to improve, and is now alert and oriented.  ROS: A 14 point ROS was performed and is negative except as noted in the HPI.   Past medical history: Schizophrenia  Family History  Problem Relation Age of Onset  . CAD Neg Hx   . Diabetes Neg Hx      Social History:  reports that he has never smoked. He has never used smokeless tobacco. He reports that he does not drink alcohol or use illicit drugs.   Exam: Current vital signs: BP 113/63 mmHg  Pulse 70  Temp(Src) 98.6 F (37 C) (Oral)  Resp 20  Ht 5\' 9"  (1.753 m)  Wt 54.9 kg (121 lb 0.5 oz)  BMI 17.87 kg/m2  SpO2 100% Vital signs in last 24 hours: Temp:  [98.6 F (37 C)] 98.6 F (37 C) (07/05 2100) Pulse Rate:  [69-83] 70 (07/05 2100) Resp:  [12-22] 20 (07/05 2100) BP: (99-117)/(45-76) 113/63 mmHg (07/05 2100) SpO2:  [97 %-100 %] 100 % (07/05 2100) Weight:  [54.9 kg (121 lb 0.5 oz)] 54.9 kg (121 lb 0.5 oz) (07/05 2100)   Physical Exam  Constitutional: Appears well-developed and well-nourished.  Psych: Affect appropriate to situation Eyes: No scleral injection HENT: No OP obstrucion Head: Normocephalic.  Cardiovascular: Normal rate and regular rhythm.  Respiratory: Effort normal and breath sounds normal to anterior ascultation GI: Soft.  No distension. There is no tenderness.  Skin:  WDI  Neuro: Mental Status: Patient is awake, alert, oriented to person, place, month, year, and situation. Patient is able to give a clear and coherent history. No signs of aphasia or neglect Cranial Nerves: II: Visual Fields are full. Pupils are equal, round, and reactive to light.   III,IV, VI: EOMI without ptosis or diploplia.  V: Facial sensation is symmetric to temperature VII: Facial movement is symmetric.  VIII: hearing is intact to voice X: Uvula elevates symmetrically XI: Shoulder shrug is symmetric. XII: tongue is midline without atrophy or fasciculations.  Motor: Tone is normal. Bulk is normal. 5/5 strength was present in all four extremities.  Sensory: Sensation is symmetric to light touch and temperature in the arms and legs. Cerebellar: FNF intact bilaterally  I have reviewed labs in epic and the results pertinent to this consultation are: BMP-unremarkable  I have reviewed the images obtained: CT head-tiny amount of hemorrhage in the ventricles bilaterally  Impression: 60 year old male with loss of consciousness presumed secondary to TBI. It is unclear what caused him to fall, and therefore there is the possibility that the convulsions or seen by a bystander or the cause of, rather than the results of the TBI. If this is the case, then this represents a first time seizure and I would not start antiepileptic medications based on that.   Given that there is question about whether it was truly seizure versus posturing due to TBI, I would  not favor starting medications at this time as it does not appear that he would qualify as a severe TBI which would require seizure prophylaxis. If he were to have any other further episodes of concern, then I would start him on seizure treatment at that time.  Recommendations: 1) MRI, EEG 2) would start AED therapy if any other spells of concern were to be noticed.    Ritta SlotMcNeill Rilynn Habel, MD [facility] [phone]  If 7pm- 7am, please page neurology on call as listed in AMION.

## 2016-04-11 NOTE — ED Notes (Signed)
CBG 122 

## 2016-04-11 NOTE — ED Notes (Signed)
Pt in c-collar.  

## 2016-04-12 ENCOUNTER — Observation Stay (HOSPITAL_BASED_OUTPATIENT_CLINIC_OR_DEPARTMENT_OTHER): Payer: Self-pay

## 2016-04-12 ENCOUNTER — Observation Stay (HOSPITAL_COMMUNITY): Payer: Self-pay

## 2016-04-12 DIAGNOSIS — I629 Nontraumatic intracranial hemorrhage, unspecified: Secondary | ICD-10-CM

## 2016-04-12 DIAGNOSIS — R404 Transient alteration of awareness: Secondary | ICD-10-CM

## 2016-04-12 DIAGNOSIS — R4020 Unspecified coma: Secondary | ICD-10-CM | POA: Insufficient documentation

## 2016-04-12 DIAGNOSIS — R55 Syncope and collapse: Secondary | ICD-10-CM

## 2016-04-12 DIAGNOSIS — S022XXD Fracture of nasal bones, subsequent encounter for fracture with routine healing: Secondary | ICD-10-CM

## 2016-04-12 LAB — ECHOCARDIOGRAM COMPLETE
CHL CUP MV DEC (S): 345
E/e' ratio: 4.45
EWDT: 345 ms
FS: 35 % (ref 28–44)
Height: 69 in
IV/PV OW: 0.88
LA vol A4C: 26.8 ml
LA vol: 27.9 mL
LADIAMINDEX: 1.41 cm/m2
LASIZE: 28 mm
LAVOLIN: 14.1 mL/m2
LEFT ATRIUM END SYS DIAM: 28 mm
LV E/e' medial: 4.45
LV e' LATERAL: 12 cm/s
LVEEAVG: 4.45
LVOT SV: 59 mL
LVOT VTI: 18.7 cm
LVOT area: 3.14 cm2
LVOT peak vel: 79.8 cm/s
LVOTD: 20 mm
MV pk E vel: 53.4 m/s
MVPKAVEL: 61.1 m/s
PW: 11.1 mm — AB (ref 0.6–1.1)
TDI e' lateral: 12
TDI e' medial: 8.16
Weight: 2910.07 oz

## 2016-04-12 LAB — COMPREHENSIVE METABOLIC PANEL
ALBUMIN: 3.4 g/dL — AB (ref 3.5–5.0)
ALT: 49 U/L (ref 17–63)
ANION GAP: 4 — AB (ref 5–15)
AST: 49 U/L — ABNORMAL HIGH (ref 15–41)
Alkaline Phosphatase: 60 U/L (ref 38–126)
BILIRUBIN TOTAL: 0.7 mg/dL (ref 0.3–1.2)
BUN: <5 mg/dL — ABNORMAL LOW (ref 6–20)
CO2: 28 mmol/L (ref 22–32)
Calcium: 9.1 mg/dL (ref 8.9–10.3)
Chloride: 106 mmol/L (ref 101–111)
Creatinine, Ser: 0.8 mg/dL (ref 0.61–1.24)
GFR calc Af Amer: >60 mL/min (ref >60)
GFR calc non Af Amer: >60 mL/min (ref >60)
GLUCOSE: 122 mg/dL — AB (ref 65–99)
POTASSIUM: 4.1 mmol/L (ref 3.5–5.1)
SODIUM: 138 mmol/L (ref 135–145)
TOTAL PROTEIN: 6.7 g/dL (ref 6.5–8.1)

## 2016-04-12 LAB — CBC
HEMATOCRIT: 40.1 % (ref 39.0–52.0)
HEMOGLOBIN: 13.6 g/dL (ref 13.0–17.0)
MCH: 32.7 pg (ref 26.0–34.0)
MCHC: 33.9 g/dL (ref 30.0–36.0)
MCV: 96.4 fL (ref 78.0–100.0)
Platelets: 126 10*3/uL — ABNORMAL LOW (ref 150–400)
RBC: 4.16 MIL/uL — ABNORMAL LOW (ref 4.22–5.81)
RDW: 12 % (ref 11.5–15.5)
WBC: 6 10*3/uL (ref 4.0–10.5)

## 2016-04-12 LAB — VAS US CAROTID
LCCADDIAS: -14 cm/s
LCCAPSYS: 95 cm/s
LEFT ECA DIAS: -6 cm/s
LEFT VERTEBRAL DIAS: -11 cm/s
LICADSYS: -44 cm/s
LICAPSYS: -58 cm/s
Left CCA dist sys: -91 cm/s
Left CCA prox dias: 14 cm/s
Left ICA dist dias: -13 cm/s
Left ICA prox dias: -19 cm/s
RCCADSYS: -68 cm/s
RIGHT ECA DIAS: -9 cm/s
RIGHT VERTEBRAL DIAS: -6 cm/s
Right CCA prox dias: 12 cm/s
Right CCA prox sys: 81 cm/s

## 2016-04-12 LAB — LIPID PANEL
CHOL/HDL RATIO: 3.6 ratio
CHOLESTEROL: 136 mg/dL (ref 0–200)
HDL: 38 mg/dL — AB (ref >40)
LDL Cholesterol: 84 mg/dL (ref 0–99)
Triglycerides: 68 mg/dL (ref <150)
VLDL: 14 mg/dL (ref 0–40)

## 2016-04-12 LAB — TROPONIN I: Troponin I: <0.03 ng/mL (ref <0.03)

## 2016-04-12 LAB — TSH: TSH: 0.33 u[IU]/mL — AB (ref 0.350–4.500)

## 2016-04-12 LAB — MAGNESIUM: MAGNESIUM: 1.7 mg/dL (ref 1.7–2.4)

## 2016-04-12 LAB — GLUCOSE, CAPILLARY: GLUCOSE-CAPILLARY: 107 mg/dL — AB (ref 65–99)

## 2016-04-12 LAB — PHOSPHORUS: PHOSPHORUS: 3.4 mg/dL (ref 2.5–4.6)

## 2016-04-12 MED ORDER — GADOBENATE DIMEGLUMINE 529 MG/ML IV SOLN
10.0000 mL | Freq: Once | INTRAVENOUS | Status: AC | PRN
  Administered 2016-04-12: 10 mL via INTRAVENOUS

## 2016-04-12 MED ORDER — SODIUM CHLORIDE 0.9 % IV SOLN
INTRAVENOUS | Status: AC
  Administered 2016-04-12: 18:00:00 via INTRAVENOUS

## 2016-04-12 MED ORDER — SODIUM CHLORIDE 0.9 % IV SOLN: INTRAVENOUS | Status: DC

## 2016-04-12 NOTE — Progress Notes (Signed)
EEG completed; results pending.    

## 2016-04-12 NOTE — Progress Notes (Signed)
Subjective: States he feels good this morning.  Asking when he can leave.  Exam: Filed Vitals:   04/12/16 0158 04/12/16 0511  BP: 110/65 92/59  Pulse: 69 63  Temp: 98.8 F (37.1 C) 98.9 F (37.2 C)  Resp: 20 20    HEENT-  Normocephalic, no lesions, without obvious abnormality.  Normal external eye and conjunctiva. Normal  external ears. Normal external nose  Normal pharynx. Cardiovascular- regular rate and rhythm and S1, S2 normal, pulses palpable throughout   Lungs- chest clear, no wheezing, rales, normal symmetric air entry Abdomen- normal findings: bowel sounds normal Extremities- less then 2 second capillary refill and no edema Lymph-no adenopathy palpable Musculoskeletal-no joint tenderness, deformity or swelling Skin-dry, abrasion noted left eyebrow    Gen: In bed, NAD MS: Alert, oriented x4 able to follow 3 step commands, Speech is fluent, no aphasia noted CN: II- XII intact Motor: 5/5 strength in all extremities, Normal tone and bulk throughout Sensory: PP, light touch, and temperature symmetric throuhgout DTR: 2+ symmetric throughout   Pertinent Labs/Diagnostics: MRI: 1. Persistent but decreased small volume intraventricular hemorrhage as compared to prior CT from 04/11/2016. No hydrocephalus or other complication. 2. Probable tiny 5 mm parenchymal hemorrhage within the left parietal lobe, likely posttraumatic in nature. No associated mass effect. Two additional tiny possible parenchymal hemorrhages as above. 3. Possible tiny acute ischemic linear infarct involving the subcortical white matter of the left parietal lobe as above. No associated hemorrhage or mass effect. 4. Left posterior scalp contusion. 5. Remote lacunar infarct involving the right thalamus.   April Pugh, MSN, RN Neurology [phone]     04/12/2016, 9:35 AM   Neurology Attending Addendum  This patient was seen, examined, and d/w NP. I have reviewed the note and agree with the  findings, assessment and plan as documented with the following additions.   The patient is doing well, no complaints. He has not had any further spells. He has no real recollection of the episode that brought him in. He denies any history of seizures. There is no family history of seizures. He denies h/o TBI, CNS infection, stroke, or illicit drug use. 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   His elemental neurologic exam is notable for 3+ DTRs with downgoing toes and absent Hoffman's. His affect is flat and restricted.   I have personally and independently reviewed the MRI of the brain with and without contrast from today. This shows a small amount of hemorrhage in the dependent portions of the lateral ventricles. He has tiny scattered areas of T2/FLAIR hyperintensity in the parietal lobes with increased susceptibility on GRE consistent with microhemorrhages. There is a small area of restricted diffusion in the left parietal lobe that is non-specific without corresponding hypointensity on ADC.   EEG is normal.   Impression/plan: 1. Spell: The etiology of his episode remains unclear. He has no recollection of the event and it was not witnessed. EEG does not indicate anything to suggest a propensity for seizures. The MRI findings are most consistent with small traumatic contusions likely due his fall--I do not think they could explain the epsiode of LOC. At this point, I would defer AED treatment given the ambiguous nature of the spell. Even if this were seizure, it would be a first time unprovoked event which would not be treated with AED. Should he have another episode that is witnessed and that is consistent with seizure, this could be reassessed.  This was discussed with the patient and he is in agreement with the plan as stated. He was given the opportunity to ask any questions and these were addressed to his satisfaction. I have no additional recommendation and  will sign off. Please call if any new issues arise.

## 2016-04-12 NOTE — Procedures (Signed)
Electroencephalogram (EEG) Report  Date of study: 04/12/16  Requesting clinician: Jerold CoombeAnastassia Doutov, MD  Reason for study: Evaluate for possible seizure  Brief clinical history: 59-yo man found down on ground.   Medications:  Current facility-administered medications:  .  0.9 %  sodium chloride infusion, , Intravenous, Continuous, Catarina Hartshornavid Tat, MD, Last Rate: 75 mL/hr at 04/12/16 1808 .  acetaminophen (TYLENOL) tablet 650 mg, 650 mg, Oral, Q6H PRN **OR** acetaminophen (TYLENOL) suppository 650 mg, 650 mg, Rectal, Q6H PRN, Therisa DoyneAnastassia Doutova, MD .  lurasidone (LATUDA) tablet 40 mg, 40 mg, Oral, Q breakfast, Therisa DoyneAnastassia Doutova, MD, 40 mg at 04/12/16 0845 .  ondansetron (ZOFRAN) tablet 4 mg, 4 mg, Oral, Q6H PRN **OR** ondansetron (ZOFRAN) injection 4 mg, 4 mg, Intravenous, Q6H PRN, Therisa DoyneAnastassia Doutova, MD .  sodium chloride flush (NS) 0.9 % injection 3 mL, 3 mL, Intravenous, Q12H, Therisa DoyneAnastassia Doutova, MD, 3 mL at 04/11/16 2313 .  sodium chloride flush (NS) 0.9 % injection 3 mL, 3 mL, Intravenous, Q12H, Therisa DoyneAnastassia Doutova, MD, 10 mL at 04/12/16 0919 .  traZODone (DESYREL) tablet 100 mg, 100 mg, Oral, QHS, Therisa DoyneAnastassia Doutova, MD, 100 mg at 04/11/16 2300  Description: This is a routine EEG performed using standard international 10-20 electrode placement. A total of 18 channels are recorded, including one for the EKG.  Activating Maneuvers: photic sitimulation  Findings:  The EKG channel demonstrates a regular rhythm with a rate of 60-65 beats per minute.   The background consists of well-formed alpha activity. The best dominant posterior rhythm is 9 Hz. This is symmetric and reacts as expected with eye opening.   There are no focal asymmetries. No epileptiform discharges are present. No seizures are recorded.   Drowsiness is recorded and is normal in appearance.  Impression: This is a normal awake and drowsy EEG.   Rhona Leavensimothy Daniele Yankowski, MD [facility]

## 2016-04-12 NOTE — Progress Notes (Signed)
VASCULAR LAB PRELIMINARY  PRELIMINARY  PRELIMINARY  PRELIMINARY  Carotid duplex completed.    Preliminary report:  Bilateral - No evidence of ICA stenosis. Vertebral artery flow is antegrade.  Curtis Mcdonald, RVS 04/12/2016, 12:03 PM

## 2016-04-12 NOTE — Progress Notes (Signed)
PROGRESS NOTE  Griffin BasilJimmy L Etchison WUJ:[id number] DOB: 06/04/1956 DOA: 04/11/2016 PCP: Quitman LivingsHASSAN,SAMI, MD  Brief History:  60 year old male with a history of schizophrenia presented after episode of syncope. The patient was getting his car inspected when he suddenly lost consciousness.  The next thing he remembered was being in the ambulance.  The patient denies any prodromal symptoms including dizziness, oral, chest discomfort, nausea, or vomiting. There were no witnesses to the onset of the events, and the patient was found down by bystanders.Apparently, there is question of whether he might have been convulsing versus posturing on arrival. There is no witness to the onset of events. He was seen to have abrasions of his left eyebrow and lower lip and a hematoma in the left occiput.  As a result, neurology was consulted. Initial CT of the brain showed bilateral acute hemorrhage of the lateral ventricles, left greater than right there was also soft tissue contusion above the left occipital bone. He also had a nondisplaced nasal fracture.  Assessment/Plan: Syncope -No concerning dysrhythmias on telemetry -Appreciate neurology consult -EEG--results pending -Cardiology consultation -04/12/2016 echo EF 55-60%, moderately dilated RV, RAE, moderate TR -Urine drug screen--neg -personally reviewed EKG shows sinus rhythm with nonspecific T wave change -Orthostatic vital signs positive by pulse  Intracranial hemorrhage -Neurosurgery consulted by ED-->no surgical intervention necessary -Suspect this may have been posttraumatic -04/11/2016 MRI brain--small intraventricular hemorrhage, 5 mm left parietal parenchymal hemorrhage, tiny ischemic linear infarct and left parietal lobe -will not start AEDs per neurology  Left parietal ischemic stroke -PT/OT evaluation -Speech therapy eval -CT brain--acute hemorrhage bilateral posterior lateral ventricles left greater than right -MRI brain--small  intraventricular hemorrhage, 5 mm left parietal parenchymal hemorrhage, tiny ischemic linear infarct and left parietal lobe -MRA brain-pending -Carotid Duplex--negative for hemodynamically significant stenosis -Echo--EF 55-60%, moderately dilated RV, RAE, moderate TR -LDL-84 -HbA1C--pending  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 -01/11/2010 HCV RNA 394K, genotype 1a -hep b surface antigen -Check HIV   Thrombocytopenia -Likely due to chronic liver disease  Schizophrenia -continue Latuda and trazadone  Nondisplaced nasal fracture -conservative tx -outpt ENT followup    Disposition Plan:   Home in 1-2 days  Family Communication:   No Family at bedside   Consultants:  Neurology, cardiology  Code Status:  FULL   DVT Prophylaxis:  SCDs   Procedures: As Listed in Progress Note Above  Antibiotics: None    Subjective: Patient denies fevers, chills, chest pain, dyspnea, nausea, vomiting, diarrhea, abdominal pain, dysuria, hematuria.  Patient complains of a frontal headache without any visual disturbance. He describes it as dull sensation not worse than the past few days.    Objective: Filed Vitals:   04/12/16 0500 04/12/16 0511 04/12/16 1024 04/12/16 1400  BP:  92/59 102/63 97/62  Pulse:  63 66 61  Temp:  98.9 F (37.2 C)  98.5 F (36.9 C)  TempSrc:  Oral Oral Oral  Resp:  20 20 18   Height:      Weight: 82.5 kg (181 lb 14.1 oz)     SpO2:  99% 98%    No intake or output data in the 24 hours ending 04/12/16 1627 Weight change:  Exam:   General:  Pt is alert, follows commands appropriately, not in acute distress  HEENT: No icterus, No thrush, No neck mass, [place]/AT  Cardiovascular: RRR, S1/S2, no rubs, no gallops  Respiratory: CTA bilaterally, no wheezing, no crackles, no rhonchi  Abdomen: Soft/+BS, non tender, non  distended, no guarding  Extremities: No edema, No lymphangitis, No petechiae, No rashes, no synovitis   Data Reviewed: I have  personally reviewed following labs and imaging studies Basic Metabolic Panel:  Recent Labs Lab 04/11/16 1552 04/12/16 0250  NA 137 138  K 3.9 4.1  CL 104 106  CO2 26 28  GLUCOSE 125* 122*  BUN 7 <5*  CREATININE 0.92 0.80  CALCIUM 8.9 9.1  MG  --  1.7  PHOS  --  3.4   Liver Function Tests:  Recent Labs Lab 04/11/16 2149 04/12/16 0250  AST 61* 49*  ALT 50 49  ALKPHOS 63 60  BILITOT 0.6 0.7  PROT 6.6 6.7  ALBUMIN 3.4* 3.4*   No results for input(s): LIPASE, AMYLASE in the last 168 hours. No results for input(s): AMMONIA in the last 168 hours. Coagulation Profile: No results for input(s): INR, PROTIME in the last 168 hours. CBC:  Recent Labs Lab 04/11/16 1552 04/12/16 0250  WBC 3.4* 6.0  HGB 14.2 13.6  HCT 41.7 40.1  MCV 97.9 96.4  PLT 116* 126*   Cardiac Enzymes:  Recent Labs Lab 04/11/16 2149 04/12/16 0250 04/12/16 0932  TROPONINI <0.03 <0.03 <0.03   BNP: Invalid input(s): POCBNP CBG:  Recent Labs Lab 04/12/16 0523  GLUCAP 107*   HbA1C: No results for input(s): HGBA1C in the last 72 hours. Urine analysis:    Component Value Date/Time   COLORURINE YELLOW 04/11/2016 1749   APPEARANCEUR HAZY* 04/11/2016 1749   LABSPEC 1.010 04/11/2016 1749   PHURINE 6.0 04/11/2016 1749   GLUCOSEU NEGATIVE 04/11/2016 1749   HGBUR NEGATIVE 04/11/2016 1749   BILIRUBINUR NEGATIVE 04/11/2016 1749   KETONESUR NEGATIVE 04/11/2016 1749   PROTEINUR NEGATIVE 04/11/2016 1749   NITRITE NEGATIVE 04/11/2016 1749   LEUKOCYTESUR NEGATIVE 04/11/2016 1749   Sepsis Labs: @LABRCNTIP (procalcitonin:4,lacticidven:4) )No results found for this or any previous visit (from the past 240 hour(s)).   Scheduled Meds: . lurasidone  40 mg Oral Q breakfast  . sodium chloride flush  3 mL Intravenous Q12H  . sodium chloride flush  3 mL Intravenous Q12H  . traZODone  100 mg Oral QHS   Continuous Infusions:   Procedures/Studies: X-ray Chest Pa And Lateral  04/12/2016  CLINICAL  DATA:  Syncope and collapse.  Seizure onset today. EXAM: CHEST  2 VIEW COMPARISON:  None. FINDINGS: The cardiomediastinal contours are normal. Mild elevation of left hemidiaphragm. Probable calcified granulomas at the left lung base. Pulmonary vasculature is normal. No consolidation, pleural effusion, or pneumothorax. No acute osseous abnormalities are seen. IMPRESSION: Mild elevation of left hemidiaphragm.  No acute process. Electronically Signed   By: Rubye Oaks M.D.   On: 04/12/2016 01:39   Ct Head Wo Contrast  04/11/2016  CLINICAL DATA:  Status post seizure episode today with a fall pain contusion to the posterior aspect of the head on the left. Altered mental status. Abrasion above the left eyebrow and on the lip. Initial encounter. EXAM: CT HEAD WITHOUT CONTRAST CT MAXILLOFACIAL WITHOUT CONTRAST CT CERVICAL SPINE WITHOUT CONTRAST TECHNIQUE: Multidetector CT imaging of the head, cervical spine, and maxillofacial structures were performed using the standard protocol without intravenous contrast. Multiplanar CT image reconstructions of the cervical spine and maxillofacial structures were also generated. COMPARISON:  None. FINDINGS: CT HEAD FINDINGS A small amount of hemorrhage is seen layering within the posterior horns of the lateral ventricles bilaterally, greater on the left. No subdural hematoma is identified. Remote lacunar infarction in the right thalamus is seen. No acute infarct,  mass lesion, midline shift, hydrocephalus or pneumocephalus is identified. Calvarium is intact. Soft tissue contusion over the left occipital bone is noted. CT MAXILLOFACIAL FINDINGS The patient has a mildly depressed right nasal bone fracture. No other facial bone fracture is seen. Paranasal sinuses and mastoid air cells are clear. The globes are intact and the lenses are located. Orbital fat is clear. CT CERVICAL SPINE FINDINGS No cervical spine fracture or malalignment is identified. Intervertebral disc space height is  maintained. The lung apices are clear. IMPRESSION: A small amount of acute hemorrhage is seen within the posterior horns of the lateral ventricles bilaterally, greater on the left. Small soft tissue contusion over the left occipital bone without underlying fracture also identified. Remote lacunar infarction right thalamus. Nondisplaced right nasal bone fracture. Negative cervical spine CT scan. Critical Value/emergent results were called by telephone at the time of interpretation on 04/11/2016 at 5:37 pm to Sharilyn Sites, P.A., who verbally acknowledged these results. Electronically Signed   By: Drusilla Kanner M.D.   On: 04/11/2016 17:39   Ct Cervical Spine Wo Contrast  04/11/2016  CLINICAL DATA:  Status post seizure episode today with a fall pain contusion to the posterior aspect of the head on the left. Altered mental status. Abrasion above the left eyebrow and on the lip. Initial encounter. EXAM: CT HEAD WITHOUT CONTRAST CT MAXILLOFACIAL WITHOUT CONTRAST CT CERVICAL SPINE WITHOUT CONTRAST TECHNIQUE: Multidetector CT imaging of the head, cervical spine, and maxillofacial structures were performed using the standard protocol without intravenous contrast. Multiplanar CT image reconstructions of the cervical spine and maxillofacial structures were also generated. COMPARISON:  None. FINDINGS: CT HEAD FINDINGS A small amount of hemorrhage is seen layering within the posterior horns of the lateral ventricles bilaterally, greater on the left. No subdural hematoma is identified. Remote lacunar infarction in the right thalamus is seen. No acute infarct, mass lesion, midline shift, hydrocephalus or pneumocephalus is identified. Calvarium is intact. Soft tissue contusion over the left occipital bone is noted. CT MAXILLOFACIAL FINDINGS The patient has a mildly depressed right nasal bone fracture. No other facial bone fracture is seen. Paranasal sinuses and mastoid air cells are clear. The globes are intact and the lenses are  located. Orbital fat is clear. CT CERVICAL SPINE FINDINGS No cervical spine fracture or malalignment is identified. Intervertebral disc space height is maintained. The lung apices are clear. IMPRESSION: A small amount of acute hemorrhage is seen within the posterior horns of the lateral ventricles bilaterally, greater on the left. Small soft tissue contusion over the left occipital bone without underlying fracture also identified. Remote lacunar infarction right thalamus. Nondisplaced right nasal bone fracture. Negative cervical spine CT scan. Critical Value/emergent results were called by telephone at the time of interpretation on 04/11/2016 at 5:37 pm to Sharilyn Sites, P.A., who verbally acknowledged these results. Electronically Signed   By: Drusilla Kanner M.D.   On: 04/11/2016 17:39   Mr Laqueta Jean VH Contrast  04/12/2016  CLINICAL DATA:  Initial evaluation for acute put loss of consciousness, found down. Found have intraventricular hemorrhage on prior CT. EXAM: MRI HEAD WITHOUT AND WITH CONTRAST TECHNIQUE: Multiplanar, multiecho pulse sequences of the brain and surrounding structures were obtained without and with intravenous contrast. CONTRAST:  10mL MULTIHANCE GADOBENATE DIMEGLUMINE 529 MG/ML IV SOLN COMPARISON:  Prior CT from 04/11/2016. FINDINGS: Cerebral volume within normal limits for patient age. No significant cerebral white matter disease present. Remote lacunar infarct within the right thalamus. Single tiny linear focus of diffusion abnormality present within  the subcortical white matter of the left parietal lobe, suspicious for possible tiny acute ischemic infarct (series 4, image 41). Minimal associated FLAIR signal abnormality (series 6, image 22). No associated hemorrhage. A second tiny subcentimeter focus of diffusion abnormality within the cortical/ subcortical region more posteriorly within the left parietal lobe present as well (series 4, image 40) while this could potentially reflect a tiny  acute ischemic infarct, and this is favored to be artifactual in nature as there is a small 5 mm focus of susceptibility artifact within this region (series 8, image 21). This focus is susceptibility artifact is favored to reflect a tiny parenchymal hemorrhage, likely related to recent trauma. There is associated FLAIR signal abnormality (series 6, image 21). Additionally, on review of prior CT, there is suggestive of a tiny parenchymal hemorrhage at this location (series 5, image 37 on that exam. A second tiny focus susceptibility artifact at the cortical region of the right parietal lobe may reflect a tiny hemorrhage as well (series 8, image 19). Additional tiny focus at the left frontoparietal region (series 8, image 18). No other acute infarct identified. Gray-white matter differentiation otherwise maintained. Major intracranial vascular flow voids are preserved. Previously identified small volume acute intraventricular hemorrhage has likely diminished as compared to prior CT. Small amount of layering FLAIR abnormality within the occipital horns consistent with a small amount of residual hemorrhage (series 6, image 12). Ventricular size is stable without hydrocephalus. No midline shift or mass effect. No ventricular trapping. No mass lesion or abnormal enhancement. No mass effect. Hippocampi demonstrate normal morphology with signal intensity bilaterally. Craniocervical junction normal. Visualized upper cervical spine unremarkable. Pituitary gland within normal limits. No acute abnormality about the globes and orbits. Paranasal sinuses are clear. No mastoid effusion. Inner ear structures grossly normal. Bone marrow signal intensity within normal limits. Small left posterior scalp contusion noted. IMPRESSION: 1. Persistent but decreased small volume intraventricular hemorrhage as compared to prior CT from 04/11/2016. No hydrocephalus or other complication. 2. Probable tiny 5 mm parenchymal hemorrhage within the  left parietal lobe, likely posttraumatic in nature. No associated mass effect. Two additional tiny possible parenchymal hemorrhages as above. 3. Possible tiny acute ischemic linear infarct involving the subcortical white matter of the left parietal lobe as above. No associated hemorrhage or mass effect. 4. Left posterior scalp contusion. 5. Remote lacunar infarct involving the right thalamus. Electronically Signed   By: Rise Mu M.D.   On: 04/12/2016 02:44   Ct Maxillofacial Wo Cm  04/11/2016  CLINICAL DATA:  Status post seizure episode today with a fall pain contusion to the posterior aspect of the head on the left. Altered mental status. Abrasion above the left eyebrow and on the lip. Initial encounter. EXAM: CT HEAD WITHOUT CONTRAST CT MAXILLOFACIAL WITHOUT CONTRAST CT CERVICAL SPINE WITHOUT CONTRAST TECHNIQUE: Multidetector CT imaging of the head, cervical spine, and maxillofacial structures were performed using the standard protocol without intravenous contrast. Multiplanar CT image reconstructions of the cervical spine and maxillofacial structures were also generated. COMPARISON:  None. FINDINGS: CT HEAD FINDINGS A small amount of hemorrhage is seen layering within the posterior horns of the lateral ventricles bilaterally, greater on the left. No subdural hematoma is identified. Remote lacunar infarction in the right thalamus is seen. No acute infarct, mass lesion, midline shift, hydrocephalus or pneumocephalus is identified. Calvarium is intact. Soft tissue contusion over the left occipital bone is noted. CT MAXILLOFACIAL FINDINGS The patient has a mildly depressed right nasal bone fracture. No other facial bone  fracture is seen. Paranasal sinuses and mastoid air cells are clear. The globes are intact and the lenses are located. Orbital fat is clear. CT CERVICAL SPINE FINDINGS No cervical spine fracture or malalignment is identified. Intervertebral disc space height is maintained. The lung  apices are clear. IMPRESSION: A small amount of acute hemorrhage is seen within the posterior horns of the lateral ventricles bilaterally, greater on the left. Small soft tissue contusion over the left occipital bone without underlying fracture also identified. Remote lacunar infarction right thalamus. Nondisplaced right nasal bone fracture. Negative cervical spine CT scan. Critical Value/emergent results were called by telephone at the time of interpretation on 04/11/2016 at 5:37 pm to Sharilyn SitesLISA SANDERS, P.A., who verbally acknowledged these results. Electronically Signed   By: Drusilla Kannerhomas  Dalessio M.D.   On: 04/11/2016 17:39    Carolanne Mercier, DO  [facility] Pager [phone]  If 7PM-7AM, please contact night-coverage [email or web address] Password [email or web address] 04/12/2016, 4:27 PM

## 2016-04-12 NOTE — Care Management Note (Signed)
Case Management Note  Patient Details  Name: MATTEUS MCNELLY MRN: [id number] Date of Birth: 29-May-1956  Subjective/Objective:    Pt admitted with ICH. He is from home alone. Pt does not have insurance and states he does not have a PCP.                Action/Plan: CM met with Mr Haithcock and asked him if he was interested in attending the [facility]. Patient interested. CM was able to obtain him an appointment at [facility], who is seeing overflow for the [facility], and placed the information on the AVS. CM went over the flyer for [facility] with the patient and informed him of the use of the pharmacy at discharge. Pt voiced understanding. CM will continue to follow for further d/c needs.   Expected Discharge Date:                  Expected Discharge Plan:  Home/Self Care  In-House Referral:     Discharge planning Services  CM Consult  Post Acute Care Choice:    Choice offered to:     DME Arranged:    DME Agency:     HH Arranged:    HH Agency:     Status of Service:  In process, will continue to follow  If discussed at Long Length of Stay Meetings, dates discussed:    Additional Comments:  Pollie Friar, RN 04/12/2016, 3:11 PM

## 2016-04-12 NOTE — Progress Notes (Signed)
  Echocardiogram 2D Echocardiogram has been performed.  Curtis Mcdonald, Curtis Mcdonald 04/12/2016, 12:25 PM

## 2016-04-12 NOTE — Progress Notes (Signed)
[facility] Pt alert and oriented x 4 and resting comfortably.  Vital signs stable.Telemetry monitor applied. Seizure pads in place for seizure precautions. Pt oriented to room and call bell.

## 2016-04-12 NOTE — Consult Note (Signed)
Admit date: 04/11/2016 Referring Physician  Dr. Delma Freeze Primary Physician Antonietta Jewel, MD Primary Cardiologist  New Reason for Consultation  Syncope  HPI: 60 year old male with history of schizophrenia who was found down by bystanders in garage does not recollect how he got there and EMS could not rule out possible seizure versus posturing. His blood pressure in the emergency room was 111/72 with normal heart rate. EKG is unremarkable. QT is normal. Neurosurgery was consult as well because of her head CT showing small acute hemorrhage in the posterior horns of the lateral ventricle. No history of chest pain.  Currently feeling well, wanting to know when he can go home. He does not recall ever having any prior episodes of syncope. No early family history of sudden cardiac death. He does not think that he was assaulted. He thinks that he has been told in the past that he had low blood pressure.  Telemetry reviewed here in hospital, normal rhythm.   PMH:  History reviewed. No pertinent past medical history. Schizophrenia PSH:   Past Surgical History  Procedure Laterality Date  . Wisdom tooth extraction     Allergies:  Review of patient's allergies indicates no known allergies. Prior to Admit Meds:   Prior to Admission medications   Medication Sig Start Date End Date Taking? Authorizing Provider  lurasidone (LATUDA) 40 MG TABS tablet Take 40 mg by mouth daily with breakfast.   Yes Historical Provider, MD  traZODone (DESYREL) 100 MG tablet Take 100 mg by mouth at bedtime.   Yes Historical Provider, MD   Fam HX:    Family History  Problem Relation Age of Onset  . CAD Neg Hx   . Diabetes Neg Hx    Social HX:    Social History   Social History  . Marital Status: Single    Spouse Name: N/A  . Number of Children: N/A  . Years of Education: N/A   Occupational History  . [employer]     [employer]   Social History Main Topics  . Smoking status: Never Smoker   . Smokeless tobacco:  Never Used  . Alcohol Use: No  . Drug Use: No  . Sexual Activity: Not on file   Other Topics Concern  . Not on file   Social History Narrative     ROS:  No fevers, no palpitations, no chest pain, no shortness of breath, no orthopnea, no bleeding, no diarrhea All 11 ROS were addressed and are negative except what is stated in the HPI   Physical Exam: Blood pressure 97/62, pulse 61, temperature 98.5 F (36.9 C), temperature source Oral, resp. rate 18, height 5' 9"  (1.753 m), weight 181 lb 14.1 oz (82.5 kg), SpO2 98 %.   General: Thin, in no acute distress Head: Eyes PERRLA, No xanthomas.   Normal cephalic and atramatic  Lungs:   Clear bilaterally to auscultation and percussion. Normal respiratory effort. No wheezes, no rales. Heart:   HRRR S1 S2 Pulses are 2+ & equal. No murmur, rubs, gallops.  No carotid bruit. No JVD.  No abdominal bruits.  Abdomen: Bowel sounds are positive, abdomen soft and non-tender without masses. No hepatosplenomegaly. Msk:  Back normal. Normal strength and tone for age. Extremities:  No clubbing, cyanosis or edema.  DP +1, abrasion above left eyebrow Neuro: Alert and oriented X 3, non-focal, MAE x 4 GU: Deferred Rectal: Deferred Psych:  Good affect, responds appropriately      Labs: Lab Results  Component Value Date  WBC 6.0 04/12/2016   HGB 13.6 04/12/2016   HCT 40.1 04/12/2016   MCV 96.4 04/12/2016   PLT 126* 04/12/2016     Recent Labs Lab 04/12/16 0250  NA 138  K 4.1  CL 106  CO2 28  BUN <5*  CREATININE 0.80  CALCIUM 9.1  PROT 6.7  BILITOT 0.7  ALKPHOS 60  ALT 49  AST 49*  GLUCOSE 122*    Recent Labs  04/11/16 2149 04/12/16 0250 04/12/16 0932  TROPONINI <0.03 <0.03 <0.03   Lab Results  Component Value Date   CHOL 136 04/12/2016   HDL 38* 04/12/2016   LDLCALC 84 04/12/2016   TRIG 68 04/12/2016   No results found for: DDIMER   Radiology:  X-ray Chest Pa And Lateral  04/12/2016  CLINICAL DATA:  Syncope and collapse.   Seizure onset today. EXAM: CHEST  2 VIEW COMPARISON:  None. FINDINGS: The cardiomediastinal contours are normal. Mild elevation of left hemidiaphragm. Probable calcified granulomas at the left lung base. Pulmonary vasculature is normal. No consolidation, pleural effusion, or pneumothorax. No acute osseous abnormalities are seen. IMPRESSION: Mild elevation of left hemidiaphragm.  No acute process. Electronically Signed   By: Jeb Levering M.D.   On: 04/12/2016 01:39   Ct Head Wo Contrast  04/11/2016  CLINICAL DATA:  Status post seizure episode today with a fall pain contusion to the posterior aspect of the head on the left. Altered mental status. Abrasion above the left eyebrow and on the lip. Initial encounter. EXAM: CT HEAD WITHOUT CONTRAST CT MAXILLOFACIAL WITHOUT CONTRAST CT CERVICAL SPINE WITHOUT CONTRAST TECHNIQUE: Multidetector CT imaging of the head, cervical spine, and maxillofacial structures were performed using the standard protocol without intravenous contrast. Multiplanar CT image reconstructions of the cervical spine and maxillofacial structures were also generated. COMPARISON:  None. FINDINGS: CT HEAD FINDINGS A small amount of hemorrhage is seen layering within the posterior horns of the lateral ventricles bilaterally, greater on the left. No subdural hematoma is identified. Remote lacunar infarction in the right thalamus is seen. No acute infarct, mass lesion, midline shift, hydrocephalus or pneumocephalus is identified. Calvarium is intact. Soft tissue contusion over the left occipital bone is noted. CT MAXILLOFACIAL FINDINGS The patient has a mildly depressed right nasal bone fracture. No other facial bone fracture is seen. Paranasal sinuses and mastoid air cells are clear. The globes are intact and the lenses are located. Orbital fat is clear. CT CERVICAL SPINE FINDINGS No cervical spine fracture or malalignment is identified. Intervertebral disc space height is maintained. The lung apices  are clear. IMPRESSION: A small amount of acute hemorrhage is seen within the posterior horns of the lateral ventricles bilaterally, greater on the left. Small soft tissue contusion over the left occipital bone without underlying fracture also identified. Remote lacunar infarction right thalamus. Nondisplaced right nasal bone fracture. Negative cervical spine CT scan. Critical Value/emergent results were called by telephone at the time of interpretation on 04/11/2016 at 5:37 pm to Quincy Carnes, P.A., who verbally acknowledged these results. Electronically Signed   By: Inge Rise M.D.   On: 04/11/2016 17:39   Ct Cervical Spine Wo Contrast  04/11/2016  CLINICAL DATA:  Status post seizure episode today with a fall pain contusion to the posterior aspect of the head on the left. Altered mental status. Abrasion above the left eyebrow and on the lip. Initial encounter. EXAM: CT HEAD WITHOUT CONTRAST CT MAXILLOFACIAL WITHOUT CONTRAST CT CERVICAL SPINE WITHOUT CONTRAST TECHNIQUE: Multidetector CT imaging of the head, cervical  spine, and maxillofacial structures were performed using the standard protocol without intravenous contrast. Multiplanar CT image reconstructions of the cervical spine and maxillofacial structures were also generated. COMPARISON:  None. FINDINGS: CT HEAD FINDINGS A small amount of hemorrhage is seen layering within the posterior horns of the lateral ventricles bilaterally, greater on the left. No subdural hematoma is identified. Remote lacunar infarction in the right thalamus is seen. No acute infarct, mass lesion, midline shift, hydrocephalus or pneumocephalus is identified. Calvarium is intact. Soft tissue contusion over the left occipital bone is noted. CT MAXILLOFACIAL FINDINGS The patient has a mildly depressed right nasal bone fracture. No other facial bone fracture is seen. Paranasal sinuses and mastoid air cells are clear. The globes are intact and the lenses are located. Orbital fat is  clear. CT CERVICAL SPINE FINDINGS No cervical spine fracture or malalignment is identified. Intervertebral disc space height is maintained. The lung apices are clear. IMPRESSION: A small amount of acute hemorrhage is seen within the posterior horns of the lateral ventricles bilaterally, greater on the left. Small soft tissue contusion over the left occipital bone without underlying fracture also identified. Remote lacunar infarction right thalamus. Nondisplaced right nasal bone fracture. Negative cervical spine CT scan. Critical Value/emergent results were called by telephone at the time of interpretation on 04/11/2016 at 5:37 pm to Quincy Carnes, P.A., who verbally acknowledged these results. Electronically Signed   By: Inge Rise M.D.   On: 04/11/2016 17:39   Mr Jeri Cos OI Contrast  04/12/2016  CLINICAL DATA:  Initial evaluation for acute put loss of consciousness, found down. Found have intraventricular hemorrhage on prior CT. EXAM: MRI HEAD WITHOUT AND WITH CONTRAST TECHNIQUE: Multiplanar, multiecho pulse sequences of the brain and surrounding structures were obtained without and with intravenous contrast. CONTRAST:  12m MULTIHANCE GADOBENATE DIMEGLUMINE 529 MG/ML IV SOLN COMPARISON:  Prior CT from 04/11/2016. FINDINGS: Cerebral volume within normal limits for patient age. No significant cerebral white matter disease present. Remote lacunar infarct within the right thalamus. Single tiny linear focus of diffusion abnormality present within the subcortical white matter of the left parietal lobe, suspicious for possible tiny acute ischemic infarct (series 4, image 41). Minimal associated FLAIR signal abnormality (series 6, image 22). No associated hemorrhage. A second tiny subcentimeter focus of diffusion abnormality within the cortical/ subcortical region more posteriorly within the left parietal lobe present as well (series 4, image 40) while this could potentially reflect a tiny acute ischemic infarct, and  this is favored to be artifactual in nature as there is a small 5 mm focus of susceptibility artifact within this region (series 8, image 21). This focus is susceptibility artifact is favored to reflect a tiny parenchymal hemorrhage, likely related to recent trauma. There is associated FLAIR signal abnormality (series 6, image 21). Additionally, on review of prior CT, there is suggestive of a tiny parenchymal hemorrhage at this location (series 5, image 37 on that exam. A second tiny focus susceptibility artifact at the cortical region of the right parietal lobe may reflect a tiny hemorrhage as well (series 8, image 19). Additional tiny focus at the left frontoparietal region (series 8, image 18). No other acute infarct identified. Gray-white matter differentiation otherwise maintained. Major intracranial vascular flow voids are preserved. Previously identified small volume acute intraventricular hemorrhage has likely diminished as compared to prior CT. Small amount of layering FLAIR abnormality within the occipital horns consistent with a small amount of residual hemorrhage (series 6, image 12). Ventricular size is stable without hydrocephalus.  No midline shift or mass effect. No ventricular trapping. No mass lesion or abnormal enhancement. No mass effect. Hippocampi demonstrate normal morphology with signal intensity bilaterally. Craniocervical junction normal. Visualized upper cervical spine unremarkable. Pituitary gland within normal limits. No acute abnormality about the globes and orbits. Paranasal sinuses are clear. No mastoid effusion. Inner ear structures grossly normal. Bone marrow signal intensity within normal limits. Small left posterior scalp contusion noted. IMPRESSION: 1. Persistent but decreased small volume intraventricular hemorrhage as compared to prior CT from 04/11/2016. No hydrocephalus or other complication. 2. Probable tiny 5 mm parenchymal hemorrhage within the left parietal lobe, likely  posttraumatic in nature. No associated mass effect. Two additional tiny possible parenchymal hemorrhages as above. 3. Possible tiny acute ischemic linear infarct involving the subcortical white matter of the left parietal lobe as above. No associated hemorrhage or mass effect. 4. Left posterior scalp contusion. 5. Remote lacunar infarct involving the right thalamus. Electronically Signed   By: Jeannine Boga M.D.   On: 04/12/2016 02:44   Ct Maxillofacial Wo Cm  04/11/2016  CLINICAL DATA:  Status post seizure episode today with a fall pain contusion to the posterior aspect of the head on the left. Altered mental status. Abrasion above the left eyebrow and on the lip. Initial encounter. EXAM: CT HEAD WITHOUT CONTRAST CT MAXILLOFACIAL WITHOUT CONTRAST CT CERVICAL SPINE WITHOUT CONTRAST TECHNIQUE: Multidetector CT imaging of the head, cervical spine, and maxillofacial structures were performed using the standard protocol without intravenous contrast. Multiplanar CT image reconstructions of the cervical spine and maxillofacial structures were also generated. COMPARISON:  None. FINDINGS: CT HEAD FINDINGS A small amount of hemorrhage is seen layering within the posterior horns of the lateral ventricles bilaterally, greater on the left. No subdural hematoma is identified. Remote lacunar infarction in the right thalamus is seen. No acute infarct, mass lesion, midline shift, hydrocephalus or pneumocephalus is identified. Calvarium is intact. Soft tissue contusion over the left occipital bone is noted. CT MAXILLOFACIAL FINDINGS The patient has a mildly depressed right nasal bone fracture. No other facial bone fracture is seen. Paranasal sinuses and mastoid air cells are clear. The globes are intact and the lenses are located. Orbital fat is clear. CT CERVICAL SPINE FINDINGS No cervical spine fracture or malalignment is identified. Intervertebral disc space height is maintained. The lung apices are clear. IMPRESSION: A  small amount of acute hemorrhage is seen within the posterior horns of the lateral ventricles bilaterally, greater on the left. Small soft tissue contusion over the left occipital bone without underlying fracture also identified. Remote lacunar infarction right thalamus. Nondisplaced right nasal bone fracture. Negative cervical spine CT scan. Critical Value/emergent results were called by telephone at the time of interpretation on 04/11/2016 at 5:37 pm to Quincy Carnes, P.A., who verbally acknowledged these results. Electronically Signed   By: Inge Rise M.D.   On: 04/11/2016 17:39   Personally viewed.  EKG:  Normal sinus rhythm, normal QT interval Personally viewed.   ASSESSMENT/PLAN:    60 year old male with history of schizophrenia on Latuda who was found in garage down questionable posturing and no EKG changes, small intracranial hemorrhage.  Possible syncope  - Unknown how he ended up on the floor in the garage resulting in intracranial hemorrhage, possible seizure activity?/ low BP.  - Echocardiogram reassuring, normal ejection fraction, no structural abnormalities.  - Telemetry unremarkable -no cardiac arrhythmias. Upon discharge we will place a 30 day event monitor to be certain.  - QT interval unremarkable on Latuda.  -  Interesting that his blood pressure last check was 92/59. Could this have been orthostatic hypotension leading to his fall?. Encourage salt liberalization, hydration.  - Reassuring cardiac family history. Reassuring, no prior syncopal episodes.  Schizophrenia  - Per primary team  Mildly decreased platelets  - 126,000  We will follow.   Candee Furbish, MD  04/12/2016  4:41 PM

## 2016-04-13 DIAGNOSIS — S022XXK Fracture of nasal bones, subsequent encounter for fracture with nonunion: Secondary | ICD-10-CM

## 2016-04-13 LAB — VITAMIN B12: VITAMIN B 12: 423 pg/mL (ref 180–914)

## 2016-04-13 LAB — GLUCOSE, CAPILLARY: GLUCOSE-CAPILLARY: 83 mg/dL (ref 65–99)

## 2016-04-13 LAB — HEMOGLOBIN A1C
HEMOGLOBIN A1C: 5.1 % (ref 4.8–5.6)
MEAN PLASMA GLUCOSE: 100 mg/dL

## 2016-04-13 LAB — HIV ANTIBODY (ROUTINE TESTING W REFLEX): HIV SCREEN 4TH GENERATION: NONREACTIVE

## 2016-04-13 NOTE — Discharge Summary (Signed)
Physician Discharge Summary  Curtis Mcdonald:[id number] DOB: 1955/12/19 DOA: 04/11/2016  PCP: Quitman Livings, MD  Admit date: 04/11/2016 Discharge date: 04/13/2016  Admitted From: Home Disposition:  Home  Recommendations for Outpatient Follow-up:  1. Follow up with PCP in 1-2 weeks  Home Health: YES Equipment/Devices: PT  Discharge Condition:stable CODE STATUS:FULL Diet recommendation: regular  Brief/Interim Summary: 60 year old male with a history of schizophrenia presented after episode of syncope. The patient was getting his car inspected when he suddenly lost consciousness. The next thing he remembered was being in the ambulance. The patient denies any prodromal symptoms including dizziness, oral, chest discomfort, nausea, or vomiting. There were no witnesses to the onset of the events, and the patient was found down by bystanders.Apparently, there is question of whether he might have been convulsing versus posturing on arrival. There is no witness to the onset of events. He was seen to have abrasions of his left eyebrow and lower lip and a hematoma in the left occiput. As a result, neurology was consulted. Initial CT of the brain showed bilateral acute hemorrhage of the lateral ventricles, left greater than right there was also soft tissue contusion above the left occipital bone. He also had a nondisplaced nasal fracture.  Discharge Diagnoses:  Syncope -No concerning dysrhythmias on telemetry -suspect due to volume depletion -Appreciate neurology consult--did not feel the pt's ICH explains his LOC -EEG--neg for seizure -Cardiology consultation--will set up for outpt event monitor after d/c, recommended liberalized diet and fluids -04/12/2016 echo EF 55-60%, moderately dilated RV, RAE, moderate TR -Urine drug screen--neg -personally reviewed EKG shows sinus rhythm with nonspecific T wave change -Orthostatic vital signs positive by pulse  Intracranial hemorrhage -Neurosurgery  consulted by ED-->no surgical intervention necessary -Suspect this may have been posttraumatic -04/11/2016 MRI brain--small intraventricular hemorrhage, 5 mm left parietal parenchymal hemorrhage, tiny ischemic linear infarct and left parietal lobe -will not start AEDs per neurology  Left parietal lobe restricted diffusion on MRI -PT evaluation-->HHPT -CT brain--acute hemorrhage bilateral posterior lateral ventricles left greater than right -MRI brain--small intraventricular hemorrhage, 5 mm left parietal parenchymal hemorrhage, tiny ischemic linear infarct and left parietal lobe -MRA brain-pending -Carotid Duplex--negative for hemodynamically significant stenosis -Echo--EF 55-60%, moderately dilated RV, RAE, moderate TR -LDL-84 -HbA1C--pend -Neurology evaluated patient and felt this area was nonspecific without corresponding hypodensity on ADC--they did not feel pt had stroke  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 -01/11/2010 HCV RNA 394K, genotype 1a--repeat results pending at time of d/c -hep b surface antigen--pending -Check HIV--pending  Thrombocytopenia -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     traZODone 100 MG tablet  Commonly known as:  DESYREL  Take 100 mg by mouth at bedtime.           Follow-up Information    Follow up with [facility] On 05/22/2016.   Why:  [phone]. Your appointment is  at 9:30. Please arrive 15 min early and bring a picture ID  and your current medications.    Contact information:   [address] [place] [address]      No Known Allergies  Consultations:  Neurology  cardiology   Procedures/Studies: X-ray Chest Pa And Lateral  04/12/2016  CLINICAL DATA:  Syncope and collapse.  Seizure onset today. EXAM: CHEST  2 VIEW COMPARISON:  None. FINDINGS: The cardiomediastinal contours are normal. Mild elevation of left hemidiaphragm. Probable calcified granulomas at the left lung base. Pulmonary vasculature is normal. No consolidation, pleural effusion, or pneumothorax. No acute osseous abnormalities are seen. IMPRESSION: Mild elevation of left hemidiaphragm.  No acute process. Electronically Signed   By: Rubye Oaks M.D.   On: 04/12/2016 01:39   Ct Head Wo Contrast  04/11/2016  CLINICAL DATA:  Status post seizure episode today with a fall pain contusion to the posterior aspect of the head on the left. Altered mental status. Abrasion above the left eyebrow and on the lip. Initial encounter. EXAM: CT HEAD WITHOUT CONTRAST CT MAXILLOFACIAL WITHOUT CONTRAST CT CERVICAL SPINE WITHOUT CONTRAST TECHNIQUE: Multidetector CT imaging of the head, cervical spine, and maxillofacial structures were performed using the standard protocol without intravenous contrast. Multiplanar CT image reconstructions of the cervical spine and maxillofacial structures were also generated. COMPARISON:  None. FINDINGS: CT HEAD FINDINGS A small amount of hemorrhage is seen layering within the posterior horns of the lateral ventricles bilaterally, greater on the left. No subdural hematoma is identified. Remote lacunar infarction in the right thalamus is seen. No acute infarct, mass lesion, midline shift, hydrocephalus or pneumocephalus is identified. Calvarium is intact. Soft tissue contusion over the left occipital bone is noted. CT MAXILLOFACIAL FINDINGS The patient has a mildly depressed right nasal bone fracture. No other facial bone  fracture is seen. Paranasal sinuses and mastoid air cells are clear. The globes are intact and the lenses are located. Orbital fat is clear. CT CERVICAL SPINE FINDINGS No cervical spine fracture or malalignment is identified. Intervertebral disc space height is maintained. The lung apices are clear. IMPRESSION: A small amount of acute hemorrhage is seen within the posterior horns of the lateral ventricles bilaterally, greater on the left. Small soft tissue contusion over the left occipital bone without underlying fracture also identified. Remote lacunar infarction right thalamus. Nondisplaced right nasal bone fracture. Negative cervical spine CT scan. Critical Value/emergent results were called by telephone at the time of interpretation on 04/11/2016 at 5:37 pm to Sharilyn Sites, P.A., who verbally acknowledged these results. Electronically Signed   By: Drusilla Kanner M.D.   On: 04/11/2016 17:39   Ct Cervical Spine Wo Contrast  04/11/2016  CLINICAL DATA:  Status post seizure episode today with a fall pain contusion to the posterior aspect of the head on the left. Altered mental status. Abrasion above the left eyebrow and on the lip. Initial encounter. EXAM: CT HEAD WITHOUT CONTRAST CT MAXILLOFACIAL WITHOUT CONTRAST CT CERVICAL SPINE WITHOUT CONTRAST TECHNIQUE: Multidetector CT imaging of the head, cervical spine, and maxillofacial structures were performed using the standard protocol without intravenous contrast. Multiplanar CT image reconstructions of the cervical spine and maxillofacial structures were also generated. COMPARISON:  None. FINDINGS: CT HEAD FINDINGS A small amount of hemorrhage is seen layering within the posterior horns of the lateral ventricles bilaterally, greater on the left. No subdural hematoma is identified. Remote lacunar infarction in the right thalamus is seen. No acute infarct, mass lesion, midline shift, hydrocephalus or pneumocephalus  is identified. Calvarium is intact. Soft tissue  contusion over the left occipital bone is noted. CT MAXILLOFACIAL FINDINGS The patient has a mildly depressed right nasal bone fracture. No other facial bone fracture is seen. Paranasal sinuses and mastoid air cells are clear. The globes are intact and the lenses are located. Orbital fat is clear. CT CERVICAL SPINE FINDINGS No cervical spine fracture or malalignment is identified. Intervertebral disc space height is maintained. The lung apices are clear. IMPRESSION: A small amount of acute hemorrhage is seen within the posterior horns of the lateral ventricles bilaterally, greater on the left. Small soft tissue contusion over the left occipital bone without underlying fracture also identified. Remote lacunar infarction right thalamus. Nondisplaced right nasal bone fracture. Negative cervical spine CT scan. Critical Value/emergent results were called by telephone at the time of interpretation on 04/11/2016 at 5:37 pm to Sharilyn SitesLISA SANDERS, P.A., who verbally acknowledged these results. Electronically Signed   By: Drusilla Kannerhomas  Dalessio M.D.   On: 04/11/2016 17:39   Mr Laqueta JeanBrain W ZOWo Contrast  04/12/2016  CLINICAL DATA:  Initial evaluation for acute put loss of consciousness, found down. Found have intraventricular hemorrhage on prior CT. EXAM: MRI HEAD WITHOUT AND WITH CONTRAST TECHNIQUE: Multiplanar, multiecho pulse sequences of the brain and surrounding structures were obtained without and with intravenous contrast. CONTRAST:  10mL MULTIHANCE GADOBENATE DIMEGLUMINE 529 MG/ML IV SOLN COMPARISON:  Prior CT from 04/11/2016. FINDINGS: Cerebral volume within normal limits for patient age. No significant cerebral white matter disease present. Remote lacunar infarct within the right thalamus. Single tiny linear focus of diffusion abnormality present within the subcortical white matter of the left parietal lobe, suspicious for possible tiny acute ischemic infarct (series 4, image 41). Minimal associated FLAIR signal abnormality (series 6,  image 22). No associated hemorrhage. A second tiny subcentimeter focus of diffusion abnormality within the cortical/ subcortical region more posteriorly within the left parietal lobe present as well (series 4, image 40) while this could potentially reflect a tiny acute ischemic infarct, and this is favored to be artifactual in nature as there is a small 5 mm focus of susceptibility artifact within this region (series 8, image 21). This focus is susceptibility artifact is favored to reflect a tiny parenchymal hemorrhage, likely related to recent trauma. There is associated FLAIR signal abnormality (series 6, image 21). Additionally, on review of prior CT, there is suggestive of a tiny parenchymal hemorrhage at this location (series 5, image 37 on that exam. A second tiny focus susceptibility artifact at the cortical region of the right parietal lobe may reflect a tiny hemorrhage as well (series 8, image 19). Additional tiny focus at the left frontoparietal region (series 8, image 18). No other acute infarct identified. Gray-white matter differentiation otherwise maintained. Major intracranial vascular flow voids are preserved. Previously identified small volume acute intraventricular hemorrhage has likely diminished as compared to prior CT. Small amount of layering FLAIR abnormality within the occipital horns consistent with a small amount of residual hemorrhage (series 6, image 12). Ventricular size is stable without hydrocephalus. No midline shift or mass effect. No ventricular trapping. No mass lesion or abnormal enhancement. No mass effect. Hippocampi demonstrate normal morphology with signal intensity bilaterally. Craniocervical junction normal. Visualized upper cervical spine unremarkable. Pituitary gland within normal limits. No acute abnormality about the globes and orbits. Paranasal sinuses are clear. No mastoid effusion. Inner ear structures grossly normal. Bone marrow signal intensity within normal limits.  Small left posterior scalp contusion noted. IMPRESSION: 1. Persistent but decreased small  volume intraventricular hemorrhage as compared to prior CT from 04/11/2016. No hydrocephalus or other complication. 2. Probable tiny 5 mm parenchymal hemorrhage within the left parietal lobe, likely posttraumatic in nature. No associated mass effect. Two additional tiny possible parenchymal hemorrhages as above. 3. Possible tiny acute ischemic linear infarct involving the subcortical white matter of the left parietal lobe as above. No associated hemorrhage or mass effect. 4. Left posterior scalp contusion. 5. Remote lacunar infarct involving the right thalamus. Electronically Signed   By: Rise Mu M.D.   On: 04/12/2016 02:44   Ct Maxillofacial Wo Cm  04/11/2016  CLINICAL DATA:  Status post seizure episode today with a fall pain contusion to the posterior aspect of the head on the left. Altered mental status. Abrasion above the left eyebrow and on the lip. Initial encounter. EXAM: CT HEAD WITHOUT CONTRAST CT MAXILLOFACIAL WITHOUT CONTRAST CT CERVICAL SPINE WITHOUT CONTRAST TECHNIQUE: Multidetector CT imaging of the head, cervical spine, and maxillofacial structures were performed using the standard protocol without intravenous contrast. Multiplanar CT image reconstructions of the cervical spine and maxillofacial structures were also generated. COMPARISON:  None. FINDINGS: CT HEAD FINDINGS A small amount of hemorrhage is seen layering within the posterior horns of the lateral ventricles bilaterally, greater on the left. No subdural hematoma is identified. Remote lacunar infarction in the right thalamus is seen. No acute infarct, mass lesion, midline shift, hydrocephalus or pneumocephalus is identified. Calvarium is intact. Soft tissue contusion over the left occipital bone is noted. CT MAXILLOFACIAL FINDINGS The patient has a mildly depressed right nasal bone fracture. No other facial bone fracture is seen.  Paranasal sinuses and mastoid air cells are clear. The globes are intact and the lenses are located. Orbital fat is clear. CT CERVICAL SPINE FINDINGS No cervical spine fracture or malalignment is identified. Intervertebral disc space height is maintained. The lung apices are clear. IMPRESSION: A small amount of acute hemorrhage is seen within the posterior horns of the lateral ventricles bilaterally, greater on the left. Small soft tissue contusion over the left occipital bone without underlying fracture also identified. Remote lacunar infarction right thalamus. Nondisplaced right nasal bone fracture. Negative cervical spine CT scan. Critical Value/emergent results were called by telephone at the time of interpretation on 04/11/2016 at 5:37 pm to Sharilyn Sites, P.A., who verbally acknowledged these results. Electronically Signed   By: Drusilla Kanner M.D.   On: 04/11/2016 17:39         Discharge Exam: Filed Vitals:   04/13/16 0532 04/13/16 1346  BP: 95/63 98/61  Pulse: 54 63  Temp: 97.7 F (36.5 C) 97.5 F (36.4 C)  Resp: 18 20   Filed Vitals:   04/13/16 0142 04/13/16 0500 04/13/16 0532 04/13/16 1346  BP: 106/69  95/63 98/61  Pulse: 50  54 63  Temp: 98 F (36.7 C)  97.7 F (36.5 C) 97.5 F (36.4 C)  TempSrc: Oral  Oral Oral  Resp: 18  18 20   Height:      Weight:  84.1 kg (185 lb 6.5 oz)    SpO2: 100%  99% 100%    General: Pt is alert, awake, not in acute distress Cardiovascular: RRR, S1/S2 +, no rubs, no gallops Respiratory: CTA bilaterally, no wheezing, no rhonchi Abdominal: Soft, NT, ND, bowel sounds + Extremities: no edema, no cyanosis   The results of significant diagnostics from this hospitalization (including imaging, microbiology, ancillary and laboratory) are listed below for reference.    Significant Diagnostic Studies: X-ray Chest Pa And Lateral  04/12/2016  CLINICAL DATA:  Syncope and collapse.  Seizure onset today. EXAM: CHEST  2 VIEW COMPARISON:  None. FINDINGS:  The cardiomediastinal contours are normal. Mild elevation of left hemidiaphragm. Probable calcified granulomas at the left lung base. Pulmonary vasculature is normal. No consolidation, pleural effusion, or pneumothorax. No acute osseous abnormalities are seen. IMPRESSION: Mild elevation of left hemidiaphragm.  No acute process. Electronically Signed   By: Rubye OaksMelanie  Ehinger M.D.   On: 04/12/2016 01:39   Ct Head Wo Contrast  04/11/2016  CLINICAL DATA:  Status post seizure episode today with a fall pain contusion to the posterior aspect of the head on the left. Altered mental status. Abrasion above the left eyebrow and on the lip. Initial encounter. EXAM: CT HEAD WITHOUT CONTRAST CT MAXILLOFACIAL WITHOUT CONTRAST CT CERVICAL SPINE WITHOUT CONTRAST TECHNIQUE: Multidetector CT imaging of the head, cervical spine, and maxillofacial structures were performed using the standard protocol without intravenous contrast. Multiplanar CT image reconstructions of the cervical spine and maxillofacial structures were also generated. COMPARISON:  None. FINDINGS: CT HEAD FINDINGS A small amount of hemorrhage is seen layering within the posterior horns of the lateral ventricles bilaterally, greater on the left. No subdural hematoma is identified. Remote lacunar infarction in the right thalamus is seen. No acute infarct, mass lesion, midline shift, hydrocephalus or pneumocephalus is identified. Calvarium is intact. Soft tissue contusion over the left occipital bone is noted. CT MAXILLOFACIAL FINDINGS The patient has a mildly depressed right nasal bone fracture. No other facial bone fracture is seen. Paranasal sinuses and mastoid air cells are clear. The globes are intact and the lenses are located. Orbital fat is clear. CT CERVICAL SPINE FINDINGS No cervical spine fracture or malalignment is identified. Intervertebral disc space height is maintained. The lung apices are clear. IMPRESSION: A small amount of acute hemorrhage is seen  within the posterior horns of the lateral ventricles bilaterally, greater on the left. Small soft tissue contusion over the left occipital bone without underlying fracture also identified. Remote lacunar infarction right thalamus. Nondisplaced right nasal bone fracture. Negative cervical spine CT scan. Critical Value/emergent results were called by telephone at the time of interpretation on 04/11/2016 at 5:37 pm to Sharilyn SitesLISA SANDERS, P.A., who verbally acknowledged these results. Electronically Signed   By: Drusilla Kannerhomas  Dalessio M.D.   On: 04/11/2016 17:39   Ct Cervical Spine Wo Contrast  04/11/2016  CLINICAL DATA:  Status post seizure episode today with a fall pain contusion to the posterior aspect of the head on the left. Altered mental status. Abrasion above the left eyebrow and on the lip. Initial encounter. EXAM: CT HEAD WITHOUT CONTRAST CT MAXILLOFACIAL WITHOUT CONTRAST CT CERVICAL SPINE WITHOUT CONTRAST TECHNIQUE: Multidetector CT imaging of the head, cervical spine, and maxillofacial structures were performed using the standard protocol without intravenous contrast. Multiplanar CT image reconstructions of the cervical spine and maxillofacial structures were also generated. COMPARISON:  None. FINDINGS: CT HEAD FINDINGS A small amount of hemorrhage is seen layering within the posterior horns of the lateral ventricles bilaterally, greater on the left. No subdural hematoma is identified. Remote lacunar infarction in the right thalamus is seen. No acute infarct, mass lesion, midline shift, hydrocephalus or pneumocephalus is identified. Calvarium is intact. Soft tissue contusion over the left occipital bone is noted. CT MAXILLOFACIAL FINDINGS The patient has a mildly depressed right nasal bone fracture. No other facial bone fracture is seen. Paranasal sinuses and mastoid air cells are clear. The globes are intact and the lenses are located. Orbital fat  is clear. CT CERVICAL SPINE FINDINGS No cervical spine fracture or  malalignment is identified. Intervertebral disc space height is maintained. The lung apices are clear. IMPRESSION: A small amount of acute hemorrhage is seen within the posterior horns of the lateral ventricles bilaterally, greater on the left. Small soft tissue contusion over the left occipital bone without underlying fracture also identified. Remote lacunar infarction right thalamus. Nondisplaced right nasal bone fracture. Negative cervical spine CT scan. Critical Value/emergent results were called by telephone at the time of interpretation on 04/11/2016 at 5:37 pm to Sharilyn Sites, P.A., who verbally acknowledged these results. Electronically Signed   By: Drusilla Kanner M.D.   On: 04/11/2016 17:39   Mr Laqueta Jean ZO Contrast  04/12/2016  CLINICAL DATA:  Initial evaluation for acute put loss of consciousness, found down. Found have intraventricular hemorrhage on prior CT. EXAM: MRI HEAD WITHOUT AND WITH CONTRAST TECHNIQUE: Multiplanar, multiecho pulse sequences of the brain and surrounding structures were obtained without and with intravenous contrast. CONTRAST:  10mL MULTIHANCE GADOBENATE DIMEGLUMINE 529 MG/ML IV SOLN COMPARISON:  Prior CT from 04/11/2016. FINDINGS: Cerebral volume within normal limits for patient age. No significant cerebral white matter disease present. Remote lacunar infarct within the right thalamus. Single tiny linear focus of diffusion abnormality present within the subcortical white matter of the left parietal lobe, suspicious for possible tiny acute ischemic infarct (series 4, image 41). Minimal associated FLAIR signal abnormality (series 6, image 22). No associated hemorrhage. A second tiny subcentimeter focus of diffusion abnormality within the cortical/ subcortical region more posteriorly within the left parietal lobe present as well (series 4, image 40) while this could potentially reflect a tiny acute ischemic infarct, and this is favored to be artifactual in nature as there is a small  5 mm focus of susceptibility artifact within this region (series 8, image 21). This focus is susceptibility artifact is favored to reflect a tiny parenchymal hemorrhage, likely related to recent trauma. There is associated FLAIR signal abnormality (series 6, image 21). Additionally, on review of prior CT, there is suggestive of a tiny parenchymal hemorrhage at this location (series 5, image 37 on that exam. A second tiny focus susceptibility artifact at the cortical region of the right parietal lobe may reflect a tiny hemorrhage as well (series 8, image 19). Additional tiny focus at the left frontoparietal region (series 8, image 18). No other acute infarct identified. Gray-white matter differentiation otherwise maintained. Major intracranial vascular flow voids are preserved. Previously identified small volume acute intraventricular hemorrhage has likely diminished as compared to prior CT. Small amount of layering FLAIR abnormality within the occipital horns consistent with a small amount of residual hemorrhage (series 6, image 12). Ventricular size is stable without hydrocephalus. No midline shift or mass effect. No ventricular trapping. No mass lesion or abnormal enhancement. No mass effect. Hippocampi demonstrate normal morphology with signal intensity bilaterally. Craniocervical junction normal. Visualized upper cervical spine unremarkable. Pituitary gland within normal limits. No acute abnormality about the globes and orbits. Paranasal sinuses are clear. No mastoid effusion. Inner ear structures grossly normal. Bone marrow signal intensity within normal limits. Small left posterior scalp contusion noted. IMPRESSION: 1. Persistent but decreased small volume intraventricular hemorrhage as compared to prior CT from 04/11/2016. No hydrocephalus or other complication. 2. Probable tiny 5 mm parenchymal hemorrhage within the left parietal lobe, likely posttraumatic in nature. No associated mass effect. Two additional  tiny possible parenchymal hemorrhages as above. 3. Possible tiny acute ischemic linear infarct involving the subcortical white  matter of the left parietal lobe as above. No associated hemorrhage or mass effect. 4. Left posterior scalp contusion. 5. Remote lacunar infarct involving the right thalamus. Electronically Signed   By: Rise Mu M.D.   On: 04/12/2016 02:44   Ct Maxillofacial Wo Cm  04/11/2016  CLINICAL DATA:  Status post seizure episode today with a fall pain contusion to the posterior aspect of the head on the left. Altered mental status. Abrasion above the left eyebrow and on the lip. Initial encounter. EXAM: CT HEAD WITHOUT CONTRAST CT MAXILLOFACIAL WITHOUT CONTRAST CT CERVICAL SPINE WITHOUT CONTRAST TECHNIQUE: Multidetector CT imaging of the head, cervical spine, and maxillofacial structures were performed using the standard protocol without intravenous contrast. Multiplanar CT image reconstructions of the cervical spine and maxillofacial structures were also generated. COMPARISON:  None. FINDINGS: CT HEAD FINDINGS A small amount of hemorrhage is seen layering within the posterior horns of the lateral ventricles bilaterally, greater on the left. No subdural hematoma is identified. Remote lacunar infarction in the right thalamus is seen. No acute infarct, mass lesion, midline shift, hydrocephalus or pneumocephalus is identified. Calvarium is intact. Soft tissue contusion over the left occipital bone is noted. CT MAXILLOFACIAL FINDINGS The patient has a mildly depressed right nasal bone fracture. No other facial bone fracture is seen. Paranasal sinuses and mastoid air cells are clear. The globes are intact and the lenses are located. Orbital fat is clear. CT CERVICAL SPINE FINDINGS No cervical spine fracture or malalignment is identified. Intervertebral disc space height is maintained. The lung apices are clear. IMPRESSION: A small amount of acute hemorrhage is seen within the posterior horns  of the lateral ventricles bilaterally, greater on the left. Small soft tissue contusion over the left occipital bone without underlying fracture also identified. Remote lacunar infarction right thalamus. Nondisplaced right nasal bone fracture. Negative cervical spine CT scan. Critical Value/emergent results were called by telephone at the time of interpretation on 04/11/2016 at 5:37 pm to Sharilyn Sites, P.A., who verbally acknowledged these results. Electronically Signed   By: Drusilla Kanner M.D.   On: 04/11/2016 17:39     Microbiology: No results found for this or any previous visit (from the past 240 hour(s)).   Labs: Basic Metabolic Panel:  Recent Labs Lab 04/11/16 1552 04/12/16 0250  NA 137 138  K 3.9 4.1  CL 104 106  CO2 26 28  GLUCOSE 125* 122*  BUN 7 <5*  CREATININE 0.92 0.80  CALCIUM 8.9 9.1  MG  --  1.7  PHOS  --  3.4   Liver Function Tests:  Recent Labs Lab 04/11/16 2149 04/12/16 0250  AST 61* 49*  ALT 50 49  ALKPHOS 63 60  BILITOT 0.6 0.7  PROT 6.6 6.7  ALBUMIN 3.4* 3.4*   No results for input(s): LIPASE, AMYLASE in the last 168 hours. No results for input(s): AMMONIA in the last 168 hours. CBC:  Recent Labs Lab 04/11/16 1552 04/12/16 0250  WBC 3.4* 6.0  HGB 14.2 13.6  HCT 41.7 40.1  MCV 97.9 96.4  PLT 116* 126*   Cardiac Enzymes:  Recent Labs Lab 04/11/16 2149 04/12/16 0250 04/12/16 0932  TROPONINI <0.03 <0.03 <0.03   BNP: Invalid input(s): POCBNP CBG:  Recent Labs Lab 04/12/16 0523 04/13/16 0620  GLUCAP 107* 83    Time coordinating discharge:  Greater than 30 minutes  Signed:  Anayeli Arel, DO [facility] Pager: [phone] 04/13/2016, 2:00 PM

## 2016-04-13 NOTE — Progress Notes (Signed)
Cardiologist: Anne Fu Subjective:  No new complaints. No CP, no SOB.   Objective:  Vital Signs in the last 24 hours: Temp:  [97.7 F (36.5 C)-98.8 F (37.1 C)] 97.7 F (36.5 C) (07/07 0532) Pulse Rate:  [50-66] 54 (07/07 0532) Resp:  [18-20] 18 (07/07 0532) BP: (95-107)/(62-76) 95/63 mmHg (07/07 0532) SpO2:  [98 %-100 %] 99 % (07/07 0532) Weight:  [185 lb 6.5 oz (84.1 kg)] 185 lb 6.5 oz (84.1 kg) (07/07 0500)  Intake/Output from previous day:     Physical Exam: General: Well developed, well nourished, in no acute distress.  Head:  Normocephalic and atraumatic. Lungs: Clear to auscultation and percussion. Heart: Normal S1 and S2.  No murmur, rubs or gallops.  Abdomen: soft, non-tender, positive bowel sounds. Extremities: No clubbing or cyanosis. No edema. Neurologic: Alert and oriented x 3.    Lab Results:  Recent Labs  04/11/16 1552 04/12/16 0250  WBC 3.4* 6.0  HGB 14.2 13.6  PLT 116* 126*    Recent Labs  04/11/16 1552 04/12/16 0250  NA 137 138  K 3.9 4.1  CL 104 106  CO2 26 28  GLUCOSE 125* 122*  BUN 7 <5*  CREATININE 0.92 0.80    Recent Labs  04/12/16 0250 04/12/16 0932  TROPONINI <0.03 <0.03   Hepatic Function Panel  Recent Labs  04/11/16 2149 04/12/16 0250  PROT 6.6 6.7  ALBUMIN 3.4* 3.4*  AST 61* 49*  ALT 50 49  ALKPHOS 63 60  BILITOT 0.6 0.7  BILIDIR 0.4  --   IBILI 0.2*  --     Recent Labs  04/12/16 0245  CHOL 136   No results for input(s): PROTIME in the last 72 hours.  Imaging: X-ray Chest Pa And Lateral  04/12/2016  CLINICAL DATA:  Syncope and collapse.  Seizure onset today. EXAM: CHEST  2 VIEW COMPARISON:  None. FINDINGS: The cardiomediastinal contours are normal. Mild elevation of left hemidiaphragm. Probable calcified granulomas at the left lung base. Pulmonary vasculature is normal. No consolidation, pleural effusion, or pneumothorax. No acute osseous abnormalities are seen. IMPRESSION: Mild elevation of left  hemidiaphragm.  No acute process. Electronically Signed   By: Rubye Oaks M.D.   On: 04/12/2016 01:39   Ct Head Wo Contrast  04/11/2016  CLINICAL DATA:  Status post seizure episode today with a fall pain contusion to the posterior aspect of the head on the left. Altered mental status. Abrasion above the left eyebrow and on the lip. Initial encounter. EXAM: CT HEAD WITHOUT CONTRAST CT MAXILLOFACIAL WITHOUT CONTRAST CT CERVICAL SPINE WITHOUT CONTRAST TECHNIQUE: Multidetector CT imaging of the head, cervical spine, and maxillofacial structures were performed using the standard protocol without intravenous contrast. Multiplanar CT image reconstructions of the cervical spine and maxillofacial structures were also generated. COMPARISON:  None. FINDINGS: CT HEAD FINDINGS A small amount of hemorrhage is seen layering within the posterior horns of the lateral ventricles bilaterally, greater on the left. No subdural hematoma is identified. Remote lacunar infarction in the right thalamus is seen. No acute infarct, mass lesion, midline shift, hydrocephalus or pneumocephalus is identified. Calvarium is intact. Soft tissue contusion over the left occipital bone is noted. CT MAXILLOFACIAL FINDINGS The patient has a mildly depressed right nasal bone fracture. No other facial bone fracture is seen. Paranasal sinuses and mastoid air cells are clear. The globes are intact and the lenses are located. Orbital fat is clear. CT CERVICAL SPINE FINDINGS No cervical spine fracture or malalignment is identified. Intervertebral disc space  height is maintained. The lung apices are clear. IMPRESSION: A small amount of acute hemorrhage is seen within the posterior horns of the lateral ventricles bilaterally, greater on the left. Small soft tissue contusion over the left occipital bone without underlying fracture also identified. Remote lacunar infarction right thalamus. Nondisplaced right nasal bone fracture. Negative cervical spine CT  scan. Critical Value/emergent results were called by telephone at the time of interpretation on 04/11/2016 at 5:37 pm to Sharilyn SitesLISA SANDERS, P.A., who verbally acknowledged these results. Electronically Signed   By: Drusilla Kannerhomas  Dalessio M.D.   On: 04/11/2016 17:39   Ct Cervical Spine Wo Contrast  04/11/2016  CLINICAL DATA:  Status post seizure episode today with a fall pain contusion to the posterior aspect of the head on the left. Altered mental status. Abrasion above the left eyebrow and on the lip. Initial encounter. EXAM: CT HEAD WITHOUT CONTRAST CT MAXILLOFACIAL WITHOUT CONTRAST CT CERVICAL SPINE WITHOUT CONTRAST TECHNIQUE: Multidetector CT imaging of the head, cervical spine, and maxillofacial structures were performed using the standard protocol without intravenous contrast. Multiplanar CT image reconstructions of the cervical spine and maxillofacial structures were also generated. COMPARISON:  None. FINDINGS: CT HEAD FINDINGS A small amount of hemorrhage is seen layering within the posterior horns of the lateral ventricles bilaterally, greater on the left. No subdural hematoma is identified. Remote lacunar infarction in the right thalamus is seen. No acute infarct, mass lesion, midline shift, hydrocephalus or pneumocephalus is identified. Calvarium is intact. Soft tissue contusion over the left occipital bone is noted. CT MAXILLOFACIAL FINDINGS The patient has a mildly depressed right nasal bone fracture. No other facial bone fracture is seen. Paranasal sinuses and mastoid air cells are clear. The globes are intact and the lenses are located. Orbital fat is clear. CT CERVICAL SPINE FINDINGS No cervical spine fracture or malalignment is identified. Intervertebral disc space height is maintained. The lung apices are clear. IMPRESSION: A small amount of acute hemorrhage is seen within the posterior horns of the lateral ventricles bilaterally, greater on the left. Small soft tissue contusion over the left occipital bone  without underlying fracture also identified. Remote lacunar infarction right thalamus. Nondisplaced right nasal bone fracture. Negative cervical spine CT scan. Critical Value/emergent results were called by telephone at the time of interpretation on 04/11/2016 at 5:37 pm to Sharilyn SitesLISA SANDERS, P.A., who verbally acknowledged these results. Electronically Signed   By: Drusilla Kannerhomas  Dalessio M.D.   On: 04/11/2016 17:39   Mr Laqueta JeanBrain W ZOWo Contrast  04/12/2016  CLINICAL DATA:  Initial evaluation for acute put loss of consciousness, found down. Found have intraventricular hemorrhage on prior CT. EXAM: MRI HEAD WITHOUT AND WITH CONTRAST TECHNIQUE: Multiplanar, multiecho pulse sequences of the brain and surrounding structures were obtained without and with intravenous contrast. CONTRAST:  10mL MULTIHANCE GADOBENATE DIMEGLUMINE 529 MG/ML IV SOLN COMPARISON:  Prior CT from 04/11/2016. FINDINGS: Cerebral volume within normal limits for patient age. No significant cerebral white matter disease present. Remote lacunar infarct within the right thalamus. Single tiny linear focus of diffusion abnormality present within the subcortical white matter of the left parietal lobe, suspicious for possible tiny acute ischemic infarct (series 4, image 41). Minimal associated FLAIR signal abnormality (series 6, image 22). No associated hemorrhage. A second tiny subcentimeter focus of diffusion abnormality within the cortical/ subcortical region more posteriorly within the left parietal lobe present as well (series 4, image 40) while this could potentially reflect a tiny acute ischemic infarct, and this is favored to be artifactual in nature  as there is a small 5 mm focus of susceptibility artifact within this region (series 8, image 21). This focus is susceptibility artifact is favored to reflect a tiny parenchymal hemorrhage, likely related to recent trauma. There is associated FLAIR signal abnormality (series 6, image 21). Additionally, on review of  prior CT, there is suggestive of a tiny parenchymal hemorrhage at this location (series 5, image 37 on that exam. A second tiny focus susceptibility artifact at the cortical region of the right parietal lobe may reflect a tiny hemorrhage as well (series 8, image 19). Additional tiny focus at the left frontoparietal region (series 8, image 18). No other acute infarct identified. Gray-white matter differentiation otherwise maintained. Major intracranial vascular flow voids are preserved. Previously identified small volume acute intraventricular hemorrhage has likely diminished as compared to prior CT. Small amount of layering FLAIR abnormality within the occipital horns consistent with a small amount of residual hemorrhage (series 6, image 12). Ventricular size is stable without hydrocephalus. No midline shift or mass effect. No ventricular trapping. No mass lesion or abnormal enhancement. No mass effect. Hippocampi demonstrate normal morphology with signal intensity bilaterally. Craniocervical junction normal. Visualized upper cervical spine unremarkable. Pituitary gland within normal limits. No acute abnormality about the globes and orbits. Paranasal sinuses are clear. No mastoid effusion. Inner ear structures grossly normal. Bone marrow signal intensity within normal limits. Small left posterior scalp contusion noted. IMPRESSION: 1. Persistent but decreased small volume intraventricular hemorrhage as compared to prior CT from 04/11/2016. No hydrocephalus or other complication. 2. Probable tiny 5 mm parenchymal hemorrhage within the left parietal lobe, likely posttraumatic in nature. No associated mass effect. Two additional tiny possible parenchymal hemorrhages as above. 3. Possible tiny acute ischemic linear infarct involving the subcortical white matter of the left parietal lobe as above. No associated hemorrhage or mass effect. 4. Left posterior scalp contusion. 5. Remote lacunar infarct involving the right  thalamus. Electronically Signed   By: Rise Mu M.D.   On: 04/12/2016 02:44   Ct Maxillofacial Wo Cm  04/11/2016  CLINICAL DATA:  Status post seizure episode today with a fall pain contusion to the posterior aspect of the head on the left. Altered mental status. Abrasion above the left eyebrow and on the lip. Initial encounter. EXAM: CT HEAD WITHOUT CONTRAST CT MAXILLOFACIAL WITHOUT CONTRAST CT CERVICAL SPINE WITHOUT CONTRAST TECHNIQUE: Multidetector CT imaging of the head, cervical spine, and maxillofacial structures were performed using the standard protocol without intravenous contrast. Multiplanar CT image reconstructions of the cervical spine and maxillofacial structures were also generated. COMPARISON:  None. FINDINGS: CT HEAD FINDINGS A small amount of hemorrhage is seen layering within the posterior horns of the lateral ventricles bilaterally, greater on the left. No subdural hematoma is identified. Remote lacunar infarction in the right thalamus is seen. No acute infarct, mass lesion, midline shift, hydrocephalus or pneumocephalus is identified. Calvarium is intact. Soft tissue contusion over the left occipital bone is noted. CT MAXILLOFACIAL FINDINGS The patient has a mildly depressed right nasal bone fracture. No other facial bone fracture is seen. Paranasal sinuses and mastoid air cells are clear. The globes are intact and the lenses are located. Orbital fat is clear. CT CERVICAL SPINE FINDINGS No cervical spine fracture or malalignment is identified. Intervertebral disc space height is maintained. The lung apices are clear. IMPRESSION: A small amount of acute hemorrhage is seen within the posterior horns of the lateral ventricles bilaterally, greater on the left. Small soft tissue contusion over the left occipital bone  without underlying fracture also identified. Remote lacunar infarction right thalamus. Nondisplaced right nasal bone fracture. Negative cervical spine CT scan. Critical  Value/emergent results were called by telephone at the time of interpretation on 04/11/2016 at 5:37 pm to Sharilyn SitesLISA SANDERS, P.A., who verbally acknowledged these results. Electronically Signed   By: Drusilla Kannerhomas  Dalessio M.D.   On: 04/11/2016 17:39   Personally viewed.   Telemetry: No adverse rhythms. Sinus/brady. No pauses. No VT Personally viewed.   EKG:  Normal QT Personally viewed.  Cardiac Studies:  Normal EF  Meds: Scheduled Meds: . lurasidone  40 mg Oral Q breakfast  . sodium chloride flush  3 mL Intravenous Q12H  . sodium chloride flush  3 mL Intravenous Q12H  . traZODone  100 mg Oral QHS   Continuous Infusions:  PRN Meds:.acetaminophen **OR** acetaminophen, ondansetron **OR** ondansetron (ZOFRAN) IV  Assessment/Plan:  Active Problems:   Intracranial hemorrhage (HCC)   Seizure (HCC)   Nasal bone fracture   Syncope   Syncope and collapse   LOC (loss of consciousness)   Syncope?/ LOC  - unknown etiology  - tele reassuring  - ECHO normal EF  - ECG reassuring  - When DC, will set up with 30 day event monitor to be sure.   - ? Low BP as cause. Encouraged hydration, salt liberalization.   ICH  - small, conservative mgt.   Neuro following.   Will sign off. Please call Trish when DC to help facilitate event monitor set up.  Donato SchultzMark Kym Scannell 04/13/2016, 9:09 AM

## 2016-04-13 NOTE — Care Management Note (Signed)
Case Management Note  Patient Details  Name: Curtis BasilJimmy L Antu MRN: [id number] Date of Birth: 06/02/1956  Subjective/Objective:                    Action/Plan: [facility]. Pt does not have insurance so CM contacted [facility] to see if pt qualifies for charity services. [facility] and [facility]. Pt does not have transportation home. CM will provide him a cab home. Bedside RN updated.   Expected Discharge Date:                  Expected Discharge Plan:  Home w Home Health Services  In-House Referral:     Discharge planning Services  CM Consult  Post Acute Care Choice:    Choice offered to:  Patient  DME Arranged:    DME Agency:     HH Arranged:  PT, RN HH Agency:  [facility]  Status of Service:  Completed, signed off  If discussed at Long Length of Stay Meetings, dates discussed:    Additional Comments:  Kermit BaloKelli F Heena Woodbury, RN 04/13/2016, 3:21 PM

## 2016-04-13 NOTE — Progress Notes (Signed)
Discharge orders received, Pt for discharge home today. IV d/c'd. D/c instructions and RX given with verbalized understanding. Family at bedside to assist patient with discharge. Staff bought pt downstairs via wheelchair.  

## 2016-04-13 NOTE — Evaluation (Signed)
Physical Therapy Evaluation Patient Details Name: Curtis Mcdonald MRN: [id number] DOB: September 13, 1956 Today's Date: 04/13/2016   History of Present Illness  Pt is a 60 y/o male who presents after being found down in his car in the garage by neighbors. Pt has a PMH of schizophrenia. CT showed small acute hemorrhage within the posterior horns of lateral ventricles and small soft tissue contusion on the L occipital bone. Questionable seizure.  Clinical Impression  Pt admitted with above diagnosis. Pt currently with functional limitations due to the deficits listed below (see PT Problem List). At the time of PT eval pt was able to perform transfers and ambulation with modified independence. Pt demonstrates generalized weakness in upper and lower extremities. May benefit from a safety screen from HHPT to assess home environment. Pt will benefit from skilled PT to increase their independence and safety with mobility to allow discharge to the venue listed below.       Follow Up Recommendations Home health PT;Supervision - Intermittent    Equipment Recommendations  None recommended by PT    Recommendations for Other Services       Precautions / Restrictions Precautions Precautions: Fall Restrictions Weight Bearing Restrictions: No      Mobility  Bed Mobility Overal bed mobility: Modified Independent                Transfers Overall transfer level: Modified independent Equipment used: None                Ambulation/Gait Ambulation/Gait assistance: Modified independent (Device/Increase time) Ambulation Distance (Feet): 500 Feet Assistive device: None Gait Pattern/deviations: WFL(Within Functional Limits)   Gait velocity interpretation: at or above normal speed for age/gender    Stairs            Wheelchair Mobility    Modified Rankin (Stroke Patients Only)       Balance Overall balance assessment: Needs assistance Sitting-balance support: Feet supported;No upper  extremity supported Sitting balance-Leahy Scale: Normal     Standing balance support: No upper extremity supported;During functional activity Standing balance-Leahy Scale: Good               High level balance activites: Direction changes;Turns;Head turns High Level Balance Comments: Pt reports onset of dizziness with head turns both verticle and horizontal.              Pertinent Vitals/Pain Pain Assessment: No/denies pain    Home Living Family/patient expects to be discharged to:: Private residence Living Arrangements: Alone   Type of Home: Apartment Home Access: Level entry     Home Layout: One level Home Equipment: Shower seat      Prior Function Level of Independence: Independent         Comments: Pt reports he drives, does not work, and spends his days watching TV and eating at home. Only leaves the apartment to go to the store.      Hand Dominance        Extremity/Trunk Assessment   Upper Extremity Assessment: Generalized weakness           Lower Extremity Assessment: Generalized weakness      Cervical / Trunk Assessment: Normal  Communication   Communication: No difficulties  Cognition Arousal/Alertness: Awake/alert Behavior During Therapy: Flat affect Overall Cognitive Status: No family/caregiver present to determine baseline cognitive functioning                      General Comments      Exercises  Assessment/Plan    PT Assessment All further PT needs can be met in the next venue of care  PT Diagnosis Generalized weakness   PT Problem List    PT Treatment Interventions     PT Goals (Current goals can be found in the Care Plan section) Acute Rehab PT Goals PT Goal Formulation: All assessment and education complete, DC therapy    Frequency     Barriers to discharge        Co-evaluation               End of Session Equipment Utilized During Treatment: Gait belt Activity Tolerance: Patient  tolerated treatment well Patient left: in bed;with call bell/phone within reach Nurse Communication: Mobility status         Time: 0379-4446 PT Time Calculation (min) (ACUTE ONLY): 17 min   Charges:   PT Evaluation $PT Eval Moderate Complexity: 1 Procedure     PT G Codes:        Rolinda Roan 05-01-2016, 12:28 PM   Rolinda Roan, PT, DPT Acute Rehabilitation Services Pager: [phone]

## 2016-04-14 LAB — HEPATITIS B SURFACE ANTIGEN: Hepatitis B Surface Ag: NEGATIVE

## 2016-04-15 LAB — HCV RNA QUANT RFLX ULTRA OR GENOTYP
HCV RNA Qnt(log copy/mL): 5.66 log10 IU/mL
HepC Qn: 457000 IU/mL

## 2016-04-15 LAB — HEPATITIS C GENOTYPE

## 2016-04-16 LAB — GLUCOSE, CAPILLARY: GLUCOSE-CAPILLARY: 96 mg/dL (ref 65–99)

## 2016-05-22 ENCOUNTER — Encounter: Payer: Self-pay | Admitting: Family Medicine

## 2016-05-22 ENCOUNTER — Ambulatory Visit (INDEPENDENT_AMBULATORY_CARE_PROVIDER_SITE_OTHER): Payer: Self-pay | Admitting: Family Medicine

## 2016-05-22 VITALS — BP 95/63 | HR 75 | Temp 98.0°F | Resp 16 | Ht 69.0 in | Wt 129.0 lb

## 2016-05-22 DIAGNOSIS — Z125 Encounter for screening for malignant neoplasm of prostate: Secondary | ICD-10-CM

## 2016-05-22 LAB — PSA: PSA: 1.3 ng/mL (ref <=4.0)

## 2016-05-22 NOTE — Progress Notes (Signed)
Curtis Mcdonald, is a 60 y.o. male  ZYS:[id number]  XNA:[id number]  DOB - 01-Oct-1956  CC:  Chief Complaint  Patient presents with  . Establish Care    hospital follow up       HPI: Curtis Mcdonald is a 60 y.o. male here to establish care. He was hospitalized on 7.5 through 7/7 after fainting, hitting his head and having an intercranial bleed.  He had no PCP so was asked to follow-up here to establish primary care. No reason was determine for his passing out. He reports he has had no further problems since discharge. He was left with no deficit. He has a history of mental health problems and is on Latuda and trazodone per [place].  I see from recent labs that he has Hep C. He was unaware of that. He reports he has had a negative HIV screening. He reports a colonoscopy in the last few years. He had an A1c drawn in June which was 5.1. He has not had Prostate cancer screening. His tetanus is up to date.   No Known Allergies History reviewed. No pertinent past medical history. Current Outpatient Prescriptions on File Prior to Visit  Medication Sig Dispense Refill  . lurasidone (LATUDA) 40 MG TABS tablet Take 40 mg by mouth daily with breakfast.    . traZODone (DESYREL) 100 MG tablet Take 100 mg by mouth at bedtime.     No current facility-administered medications on file prior to visit.    Family History  Problem Relation Age of Onset  . CAD Neg Hx   . Diabetes Neg Hx    Social History   Social History  . Marital status: Single    Spouse name: N/A  . Number of children: N/A  . Years of education: N/A   Occupational History  . Atm assembly     [employer]   Social History Main Topics  . Smoking status: Never Smoker  . Smokeless tobacco: Never Used  . Alcohol use No     Comment: fomer   . Drug use: No  . Sexual activity: Not on file   Other Topics Concern  . Not on file   Social History Narrative  . No narrative on file    Review of Systems: Constitutional: Negative for fever,  chills, appetite change, weight loss,  Fatigue. Skin: Negative for rashes or lesions of concern. HENT: Negative for ear pain, ear discharge.nose bleeds Eyes: Negative for pain, discharge, redness, itching and visual disturbance. Neck: Negative for pain, stiffness Respiratory: Negative for cough, shortness of breath,   Cardiovascular: Negative for chest pain, palpitations and leg swelling. Gastrointestinal: Negative for abdominal pain, nausea, vomiting, diarrhea, constipations Genitourinary: Negative for dysuria, urgency, frequency, hematuria,  Musculoskeletal: Negative for back pain, joint pain, joint  swelling, and gait problem.Negative for weakness. Neurological: Negative for dizziness, tremors, seizures,   light-headedness, numbness and headaches. One episode syncopy in June, 2017. Hematological: Negative for easy bruising or bleeding Psychiatric/Behavioral: Positive for depression, anxiety.   Objective:   Vitals:   05/22/16 0959  BP: 95/63  Pulse: 75  Resp: 16  Temp: 98 F (36.7 C)    Physical Exam: Constitutional: Patient appears well-developed and well-nourished. No distress. Appears unkempt. HENT: Normocephalic, atraumatic, External right and left ear normal. Oropharynx is clear and moist.  Eyes: Conjunctivae and EOM are normal. PERRLA, no scleral icterus. Neck: Normal ROM. Neck supple. No lymphadenopathy, No thyromegaly. CVS: RRR, S1/S2 +, no murmurs, no gallops, no rubs Pulmonary: Effort and breath sounds  normal, no stridor, rhonchi, wheezes, rales.  Abdominal: Soft. Normoactive BS,, no distension, tenderness, rebound or guarding.  Musculoskeletal: Normal range of motion. No edema and no tenderness.  Neuro: Alert.Normal muscle tone coordination. Non-focal Skin: Skin is warm and dry. No rash noted. Not diaphoretic. No erythema. No pallor. Psychiatric: Normal mood and affect. Behavior, judgment, thought content normal.  Lab Results  Component Value Date   WBC 6.0  04/12/2016   HGB 13.6 04/12/2016   HCT 40.1 04/12/2016   MCV 96.4 04/12/2016   PLT 126 (L) 04/12/2016   Lab Results  Component Value Date   CREATININE 0.80 04/12/2016   BUN <5 (L) 04/12/2016   NA 138 04/12/2016   K 4.1 04/12/2016   CL 106 04/12/2016   CO2 28 04/12/2016    Lab Results  Component Value Date   HGBA1C 5.1 04/12/2016   Lipid Panel     Component Value Date/Time   CHOL 136 04/12/2016 0245   TRIG 68 04/12/2016 0245   HDL 38 (L) 04/12/2016 0245   CHOLHDL 3.6 04/12/2016 0245   VLDL 14 04/12/2016 0245   LDLCALC 84 04/12/2016 0245       Assessment and plan:   1. [place] to establish care -I have reviewed information provided by the patient and pertinent information in his chart.   2. Screening for prostate cancer  - PSA   Return in about 6 months (around 11/22/2016).  The patient was given clear instructions to go to ER or return to medical center if symptoms don't improve, worsen or new problems develop. The patient verbalized understanding.    Micheline Chapman FNP  05/22/2016, 1:10 PM

## 2016-08-22 ENCOUNTER — Ambulatory Visit: Payer: Self-pay | Admitting: Family Medicine

## 2017-10-05 IMAGING — DX DG CHEST 2V
2 series · 2 of 2 positions shown · non-contrast
Comparison: None.

CLINICAL DATA: Syncope and collapse.  Seizure onset today.

EXAM:
CHEST  2 VIEW

[chest lat]
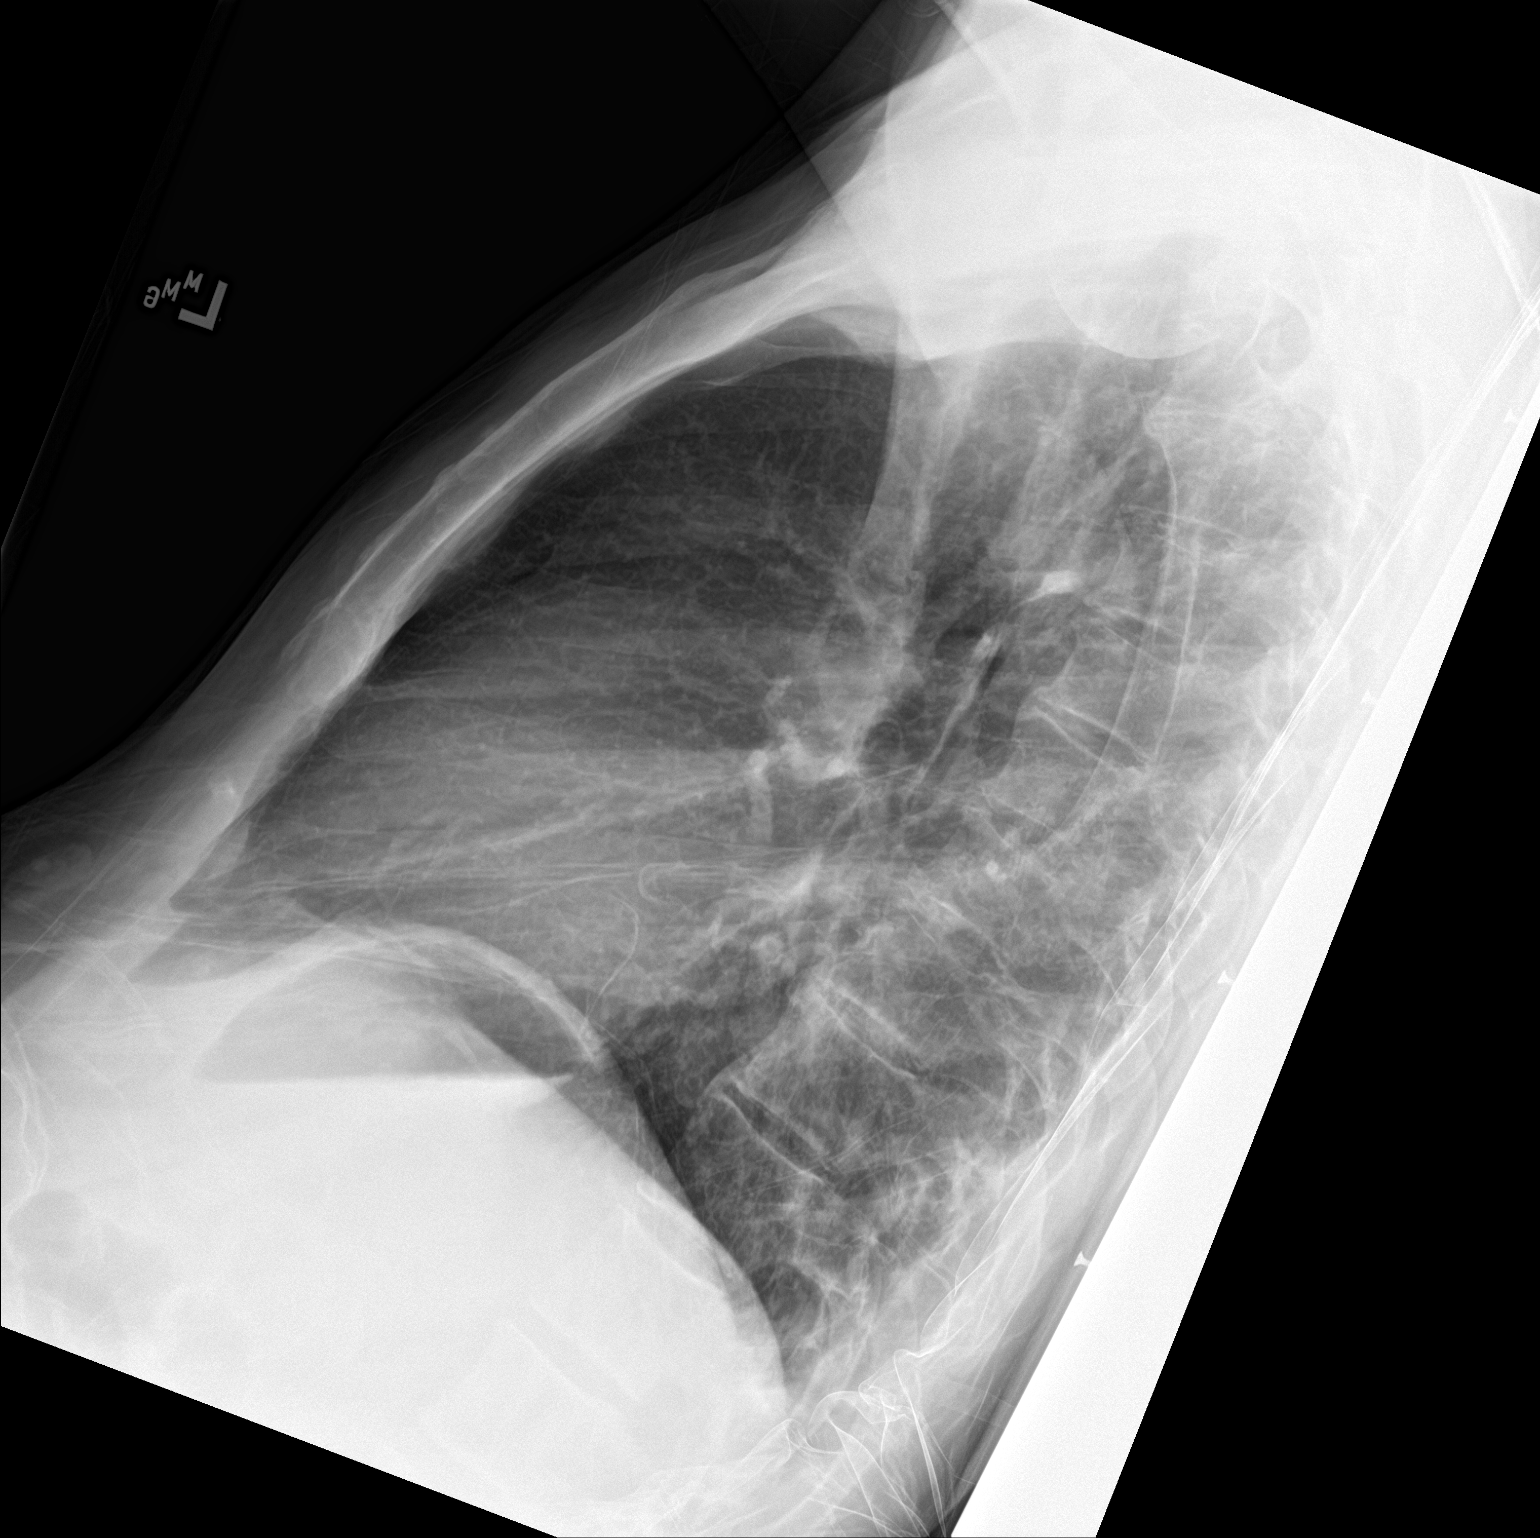

[chest ap]
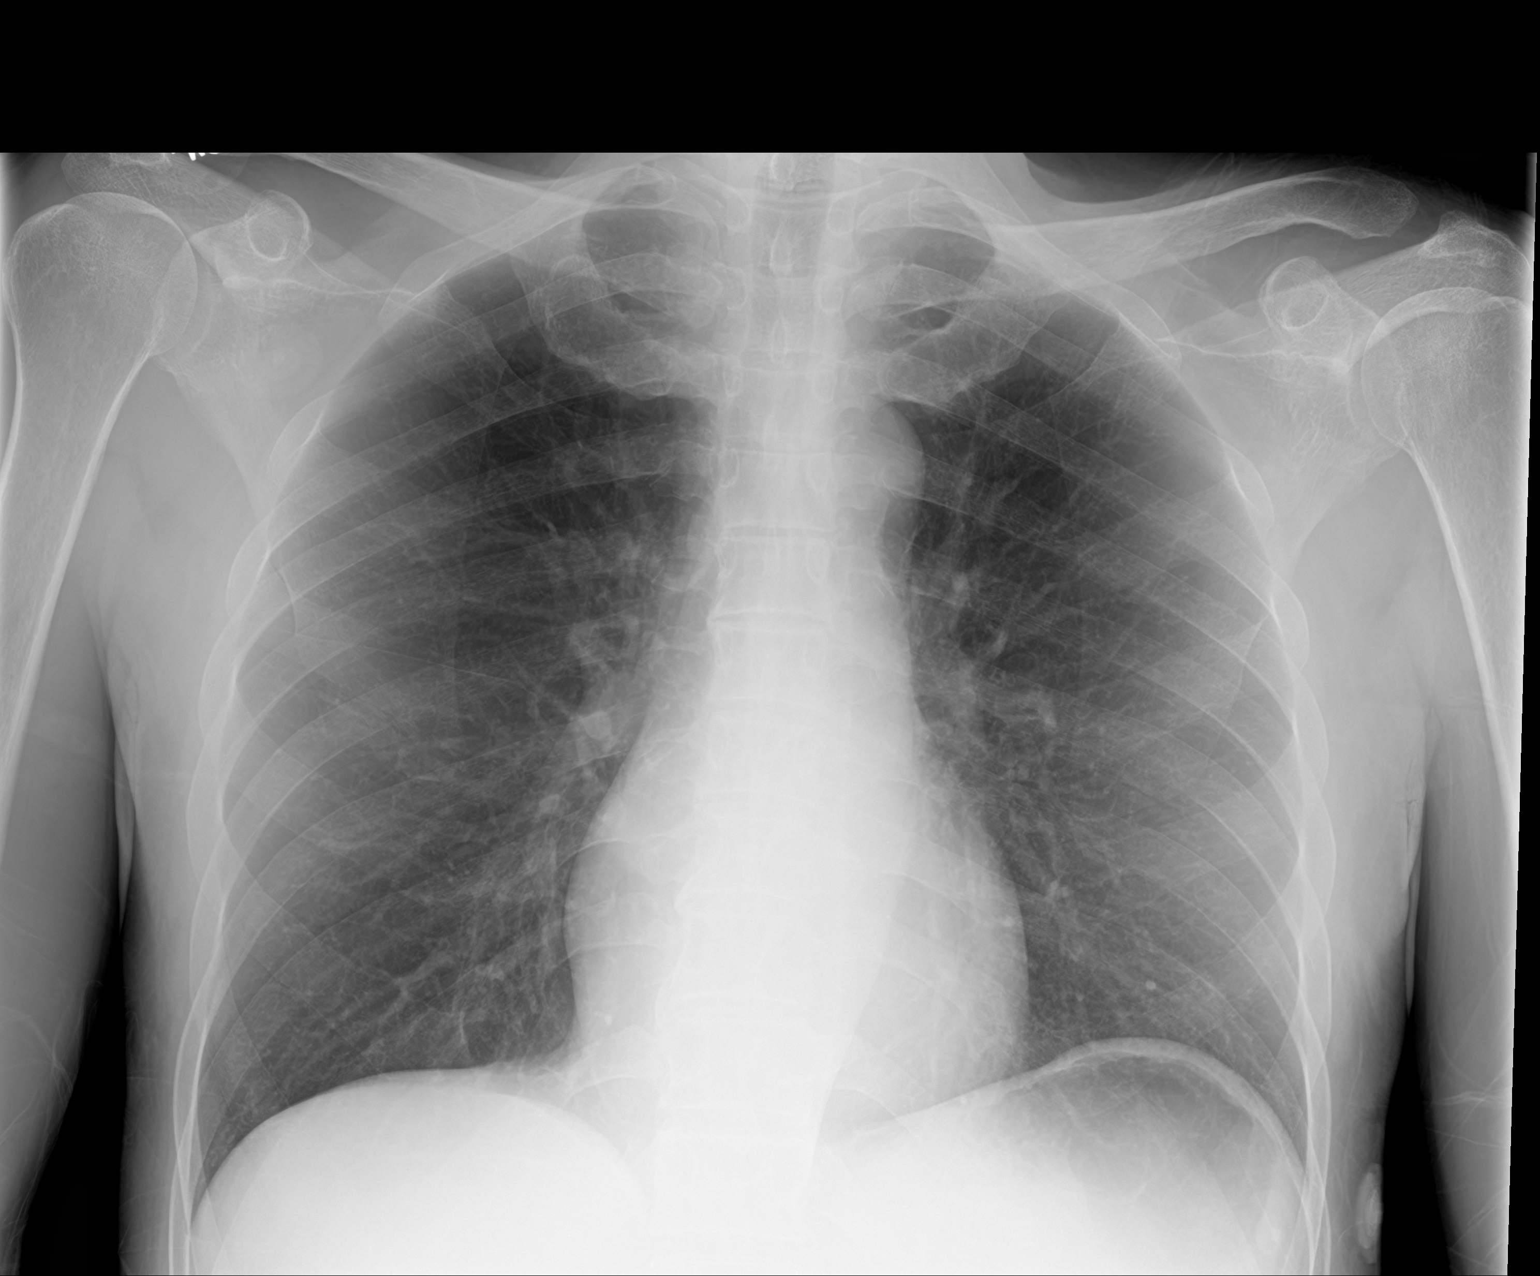

[2 of 2 positions shown; findings below may reference images not displayed]

FINDINGS: The cardiomediastinal contours are normal. Mild elevation of left
hemidiaphragm. Probable calcified granulomas at the left lung base.
Pulmonary vasculature is normal. No consolidation, pleural effusion,
or pneumothorax. No acute osseous abnormalities are seen.
IMPRESSION: Mild elevation of left hemidiaphragm.  No acute process.

## 2020-02-22 DIAGNOSIS — F411 Generalized anxiety disorder: Secondary | ICD-10-CM | POA: Insufficient documentation

## 2020-03-02 DIAGNOSIS — F209 Schizophrenia, unspecified: Secondary | ICD-10-CM | POA: Insufficient documentation

## 2020-03-30 ENCOUNTER — Ambulatory Visit (INDEPENDENT_AMBULATORY_CARE_PROVIDER_SITE_OTHER): Payer: No Payment, Other | Admitting: Licensed Clinical Social Worker

## 2020-03-30 ENCOUNTER — Ambulatory Visit (INDEPENDENT_AMBULATORY_CARE_PROVIDER_SITE_OTHER): Payer: No Payment, Other | Admitting: Psychiatry

## 2020-03-30 ENCOUNTER — Encounter (HOSPITAL_COMMUNITY): Payer: Self-pay | Admitting: Licensed Clinical Social Worker

## 2020-03-30 ENCOUNTER — Encounter (HOSPITAL_COMMUNITY): Payer: Self-pay | Admitting: Psychiatry

## 2020-03-30 ENCOUNTER — Other Ambulatory Visit: Payer: Self-pay

## 2020-03-30 DIAGNOSIS — F200 Paranoid schizophrenia: Secondary | ICD-10-CM

## 2020-03-30 DIAGNOSIS — F411 Generalized anxiety disorder: Secondary | ICD-10-CM | POA: Diagnosis not present

## 2020-03-30 MED ORDER — TRAZODONE HCL 100 MG PO TABS: 100.0000 mg | ORAL_TABLET | Freq: Every day | ORAL | 1 refills | Status: DC

## 2020-03-30 MED ORDER — LURASIDONE HCL 40 MG PO TABS: 40.0000 mg | ORAL_TABLET | Freq: Every day | ORAL | 2 refills | Status: DC

## 2020-03-30 NOTE — Patient Instructions (Signed)

## 2020-03-30 NOTE — Progress Notes (Signed)
Psychiatric Initial Adult Assessment   Patient Identification: Curtis Mcdonald MRN:  [id number] Date of Evaluation:  03/30/2020 Referral Source: [facility] Chief Complaint:  " I am doing okay because I'm on my meds." Visit Diagnosis:    ICD-10-CM   1. Paranoid schizophrenia ([place])  F20.0 lurasidone (LATUDA) 40 MG TABS tablet    traZODone (DESYREL) 100 MG tablet    History of Present Illness: 64 year old male seen today for initial psychiatric evaluation.  Patient was referred to outpatient psychiatry by [facility] for medication management.  He has a psychiatric history of schizophrenia, anxiety, and cocaine abuse.  He is currently managed on Latuda 40 mg daily and trazodone 100 mg at bedtime. He notes that he is doing well over all and notes that his medications have been effective in managing his psychiatric conditions.  Today he denies symptoms of depression, anxiety, and psychosis.  He denies SI/HI/VH.  He informed provider that he moved to [place] in 2004.  He notes that he lives in [place] to help with his addiction to cocaine.  Since 2004 he notes that he has been sober and now enjoys spending time in church and watching TV.  He saw the outpatient counselor today and reports that his session was good.  He plans to follow-up with provider in 1 month as well as follow-up with outpatient counselor for therapy.  No other concerns noted at this time  Associated Signs/Symptoms: Depression Symptoms:  Denies (Hypo) Manic Symptoms:  Denies Anxiety Symptoms:  Denies Psychotic Symptoms:  Denies PTSD Symptoms: NA  Past Psychiatric History: Schizophrenia, anxiety, cocaine abuse  Previous Psychotropic Medications: Yes   Substance Abuse History in the last 12 months:  No.  Consequences of Substance Abuse: NA  Past Medical History: No past medical history on file.  Past Surgical History:  Procedure Laterality Date  . WISDOM TOOTH EXTRACTION      Family Psychiatric History: Denies Family  History:  Family History  Problem Relation Age of Onset  . CAD Neg Hx   . Diabetes Neg Hx     Social History:   Social History   Socioeconomic History  . Marital status: Single    Spouse name: Not on file  . Number of children: Not on file  . Years of education: Not on file  . Highest education level: Not on file  Occupational History  . Occupation: Atm assembly    Comment: [employer]  Tobacco Use  . Smoking status: Never Smoker  . Smokeless tobacco: Never Used  Substance and Sexual Activity  . Alcohol use: No    Comment: fomer   . Drug use: No  . Sexual activity: Not Currently  Other Topics Concern  . Not on file  Social History Narrative  . Not on file   Social Determinants of Health   Financial Resource Strain: Low Risk   . Difficulty of Paying Living Expenses: Not very hard  Food Insecurity: No Food Insecurity  . Worried About Charity fundraiser in the Last Year: Never true  . Ran Out of Food in the Last Year: Never true  Transportation Needs: No Transportation Needs  . Lack of Transportation (Medical): No  . Lack of Transportation (Non-Medical): No  Physical Activity: Inactive  . Days of Exercise per Week: 0 days  . Minutes of Exercise per Session: 0 min  Stress: Stress Concern Present  . Feeling of Stress : To some extent  Social Connections: Moderately Isolated  . Frequency of Communication with  Friends and Family: Never  . Frequency of Social Gatherings with Friends and Family: Never  . Attends Religious Services: More than 4 times per year  . Active Member of Clubs or Organizations: Yes  . Attends Archivist Meetings: More than 4 times per year  . Marital Status: Separated    Additional Social History: Patient reports that he resides in [place]. He is single and have no children. He is currently unemployed. He denies tobacco, alcohol, or illicit drug use  Allergies:  No Known Allergies  Metabolic Disorder Labs: Lab Results  Component  Value Date   HGBA1C 5.1 04/12/2016   MPG 100 04/12/2016   No results found for: PROLACTIN Lab Results  Component Value Date   CHOL 136 04/12/2016   TRIG 68 04/12/2016   HDL 38 (L) 04/12/2016   CHOLHDL 3.6 04/12/2016   VLDL 14 04/12/2016   LDLCALC 84 04/12/2016   LDLCALC 94 01/11/2010   Lab Results  Component Value Date   TSH 0.330 (L) 04/12/2016    Therapeutic Level Labs: No results found for: LITHIUM No results found for: CBMZ No results found for: VALPROATE  Current Medications: Current Outpatient Medications  Medication Sig Dispense Refill  . lurasidone (LATUDA) 40 MG TABS tablet Take 1 tablet (40 mg total) by mouth daily with breakfast. 30 tablet 2  . traZODone (DESYREL) 100 MG tablet Take 1 tablet (100 mg total) by mouth at bedtime. 30 tablet 1   No current facility-administered medications for this visit.    Musculoskeletal: Strength & Muscle Tone: within normal limits Gait & Station: normal Patient leans: N/A  Psychiatric Specialty Exam: Review of Systems  There were no vitals taken for this visit.There is no height or weight on file to calculate BMI.  General Appearance: Well Groomed  Eye Contact:  Good  Speech:  Clear and Coherent and Normal Rate  Volume:  Normal  Mood:  Euthymic  Affect:  Congruent  Thought Process:  Coherent, Goal Directed and Linear  Orientation:  Full (Time, Place, and Person)  Thought Content:  WDL and Logical  Suicidal Thoughts:  No  Homicidal Thoughts:  No  Memory:  Immediate;   Good Recent;   Good Remote;   Good  Judgement:  Good  Insight:  Good  Psychomotor Activity:  Normal  Concentration:  Concentration: Good and Attention Span: Good  Recall:  Good  Fund of Knowledge:Good  Language: Good  Akathisia:  No  Handed:  Right  AIMS (if indicated): Not done  Assets:  Communication Skills Desire for Improvement Housing Social Support  ADL's:  Intact  Cognition: WNL  Sleep:  Good   Screenings: PHQ2-9     Counselor  from 03/30/2020 in [facility] Office Visit from 05/22/2016 in [place]  PHQ-2 Total Score 2 0  PHQ-9 Total Score 4 --      Assessment and Plan: Reports that well on his current medication regimen and is agreeable to continue medications as prescribed.     1. Paranoid schizophrenia ([place])  COntinue- lurasidone (LATUDA) 40 MG TABS tablet; Take 1 tablet (40 mg total) by mouth daily with breakfast.  Dispense: 30 tablet; Refill: 2 Continue- traZODone (DESYREL) 100 MG tablet; Take 1 tablet (100 mg total) by mouth at bedtime.  Dispense: 30 tablet; Refill: 1   Follow-up in 1 month.  Salley Slaughter, NP 6/23/202110:35 AM

## 2020-03-30 NOTE — Progress Notes (Signed)
Comprehensive Clinical Assessment (CCA) Note  03/30/2020 Curtis Mcdonald [id number]   Client is a 64 year old Male.  Client is referred by [facility]  for a for paranoid schizophrenia and general anxiety .   Client states mental health symptoms as evidenced by: Feeling nervous, sense of impending danger, tired, irritable, restless, paranoia (Hx when not taking meds), Hx of delusions, lack of energy, trouble falling and staying asleep.   Symptoms were reported by pt as manageable when taking medications    Client denies suicidal and homicidal ideations at this time Client denies hallucinations and delusions at this time. But does report Hx in the past 12 months   Client was screened for the following SDOH: exercise, stress, social interactions and housing.   Assessment Information that integrates subjective and objective details with a therapist's professional interpretation:  SW met with pt for face to face initial evaluation . Duration was 60 minutes.Curtis Mcdonald was alert and oriented x 5. Dressed casually, and answered all question appropriately. Pt reports Hx of Dx of schizophrenia support by previous documents from 2017 during chart review. It was reported that pt stopped taking his medications during covid after he lost his phone. Family who currently lives in [place] helped get phone back about 2 months ago, then pt made f/u appt with [facility] to get back onto medication regiment. Pt reports Latuda and trazodone have been improving symptoms and wants to continue taking it.    Client meets criteria for Paranoid Schizophrenia and anxiety    Client states use of the following substances: none reported     Treatment recommendations are include plan CBT therapy for increased coping mechanism and psychosocial education to help with understanding of Dx.  Pt want to decrease anxiety and severity of symptoms by implementing bettering coping mechanisms.  Goals:  Reduce overall level, frequency, and  intensity of the anxiety so that daily functioning is not impaired; Stabilize anxiety level while increasing ability to function on a daily basis.   Objective: Ask the client to develop and process a list of key past and present life conflicts that continue to cause worry; Assist the client in becoming aware of key unresolved life conflicts and in starting to work toward their resolution. Develop behavioral and cognitive strategies to reduce or eliminate the irrational anxiety; Implement appropriate relaxation and diversion activities to decrease level of anxiety. Encourage the client to increase daily social, academic and vocational activities and other potentially rewarding experiences; Strengthen his/her new non avoidant approach and build confidence  Client provided information on  SSDI and provided phone number for f/u with SS office   Clinician assisted client with scheduling the following appointments: 2 weeks. Clinician details of appointment.    Client agreed with treatment recommendations. Visit Diagnosis:      ICD-10-CM   1. Paranoid schizophrenia ([place])  F20.0   2. Generalized anxiety disorder  F41.1       CCA Screening, Triage and Referral (STR)  Patient Reported Information How did you hear about [place]? Other (Comment)  Referral name: [facility]  Referral phone number: No data recorded  Whom do you see for routine medical problems? I don't have a doctor   What Do You Feel Would Help You the Most Today? Therapy;Medication   Have You Recently Been in Any Inpatient Treatment (Hospital/Detox/Crisis Center/28-Day Program)? No  Have You Ever Received Services From [facility] [employer] Before? No   Have You Recently Had Any Thoughts About Hurting Yourself? No  Are You Planning to Commit  Suicide/Harm Yourself At This time? No   Have you Recently Had Thoughts About [place]? No   Have You Used Any Alcohol or Drugs in the Past 24 Hours? No   Do You Currently Have a  Therapist/Psychiatrist? Yes  Name of Therapist/Psychiatrist: Referred by [facility]   Have You Been Recently Discharged From Any Office Practice or Programs? No     CCA Screening Triage Referral Assessment Type of Contact: Face-to-Face   Patient Reported Information Reviewed? Yes  Collateral Involvement: none   Is CPS involved or ever been involved? Never  Is APS involved or ever been involved? Never   Patient Determined To Be At Risk for Harm To Self or Others Based on Review of Patient Reported Information or Presenting Complaint? No   Location of Assessment: [facility]   Does Patient Present under Involuntary Commitment? No   [place] of Residence: [place]   Patient Currently Receiving the Following Services: No data recorded  Determination of Need: No data recorded  Options For Referral: Medication Management;Outpatient Therapy     CCA Biopsychosocial  Intake/Chief Complaint:  CCA Intake With Chief Complaint CCA Part Two Date: 03/30/20 Chief Complaint/Presenting Problem: schizophrenia and anxiety Patient's Currently Reported Symptoms/Problems: parnoid thought process, racing/rapid thoughts, anxious, Individual's Strengths: persitent and people person Type of Services Patient Feels Are Needed: therapy and medication mgnt Initial Clinical Notes/Concerns: Hx of paranoid thought process  Mental Health Symptoms Depression:     Mania:     Anxiety:   Anxiety: Difficulty concentrating, Restlessness, Sleep, Tension, Worrying  Psychosis:     Trauma:     Obsessions:     Compulsions:     Inattention:     Hyperactivity/Impulsivity:     Oppositional/Defiant Behaviors:     Emotional Irregularity:     Other Mood/Personality Symptoms:      Mental Status Exam Appearance and self-care  Stature:  Stature: Average  Weight:  Weight: Average weight  Clothing:  Clothing: Casual, Dirty  Grooming:  Grooming: Well-groomed  Cosmetic use:  Cosmetic Use:  None  Posture/gait:  Posture/Gait: Normal  Motor activity:     Sensorium  Attention:  Attention: Normal  Concentration:  Concentration: Normal  Orientation:  Orientation: X5  Recall/memory:  Recall/Memory: Normal  Affect and Mood  Affect:  Affect: Appropriate  Mood:     Relating  Eye contact:  Eye Contact: Normal  Facial expression:     Attitude toward examiner:     Thought and Language  Speech flow:    Thought content:     Preoccupation:     Hallucinations:     Organization:     Transport planner of Knowledge:  Fund of Knowledge: Fair  Intelligence:  Intelligence: Average  Abstraction:  Abstraction: Functional  Judgement:     Reality Testing:  Reality Testing: Realistic  Insight:  Insight: Fair  Decision Making:  Decision Making: Normal  Social Functioning  Social Maturity:  Social Maturity: Isolates  Social Judgement:  Social Judgement: "Games developer"  Stress  Stressors:  Stressors: Work, Hotel manager:  Coping Ability: English as a second language teacher Deficits:  Skill Deficits: Communication, Responsibility  Supports:  Supports: Church, Family     Religion: Religion/Spirituality Are You A Religious Person?: Yes What is Your Religious Affiliation?: Baptist How Might This Affect Treatment?: it will not affect it  Leisure/Recreation: Leisure / Recreation Do You Have Hobbies?: No (pt wants to increase them)  Exercise/Diet: Exercise/Diet Do You Exercise?: No Have You Gained or Lost  A Significant Amount of Weight in the Past Six Months?: No Do You Follow a Special Diet?: No Do You Have Any Trouble Sleeping?: Yes Explanation of Sleeping Difficulties: trouble falling/staying asleep when pt has parnoid thoughts   CCA Employment/Education  Employment/Work Situation: Employment / Work Situation Employment situation: Unemployed Patient's job has been impacted by current illness: Yes Describe how patient's job has been impacted: Pt is unable to stay  focused for long periods of time due to racing and paranoid thoughts What is the longest time patient has a held a job?: 5 years Where was the patient employed at that time?: [employer] Has patient ever been in the [employer]?: Yes (Describe in comment) ([employer]: Psychologist, educational)  Education: Education Is Patient Currently Attending School?: No Last Grade Completed: 12 Did Teacher, adult education From [employer]?: Yes Did Physicist, medical?: Yes What Type of College Degree Do you Have?: 3 months then dropped out Did [place]?: No Did You Have An Individualized Education Program (IIEP): No Did You Have Any Difficulty At School?: No Patient's Education Has Been Impacted by Current Illness: No   CCA Family/Childhood History  Family and Relationship History: Family history Marital status: Separated Separated, when?: 10 years Are you sexually active?: No Does patient have children?: No  Childhood History:  Childhood History By whom was/is the patient raised?: Both parents Description of patient's relationship with caregiver when they were a child: Dad deceased and mom good Patient's description of current relationship with people who raised him/her: good Does patient have siblings?: Yes Number of Siblings: 2 Description of patient's current relationship with siblings: good Did patient suffer any verbal/emotional/physical/sexual abuse as a child?: No Did patient suffer from severe childhood neglect?: No Has patient ever been sexually abused/assaulted/raped as an adolescent or adult?: No Was the patient ever a victim of a crime or a disaster?: No Witnessed domestic violence?: No Has patient been affected by domestic violence as an adult?: No      DSM5 Diagnoses: Patient Active Problem List   Diagnosis Date Noted  . LOC (loss of consciousness) ([place])   . Intracranial hemorrhage ([place]) 04/11/2016  . Seizure ([place]) 04/11/2016  . Nasal bone fracture 04/11/2016  . Syncope  04/11/2016  . Syncope and collapse 04/11/2016  . Lumbar back pain 04/07/2012    Patient Centered Plan: Patient is on the following Treatment Plan(s):  Anxiety   Dory Horn

## 2020-04-11 ENCOUNTER — Ambulatory Visit: Payer: Self-pay | Attending: Vascular Surgery

## 2020-04-11 DIAGNOSIS — Z23 Encounter for immunization: Secondary | ICD-10-CM

## 2020-04-11 NOTE — Progress Notes (Signed)
   Covid-19 Vaccination Clinic  Name:  Curtis Mcdonald    MRN: [id number] DOB: [birth year]  04/11/2020  [name] was observed post Covid-19 immunization for 15 minutes without incident. He was provided with Vaccine Information Sheet and instruction to access the V-Safe system.   [name] was instructed to call 911 with any severe reactions post vaccine: Marland Kitchen Difficulty breathing  . Swelling of face and throat  . A fast heartbeat  . A bad rash all over body  . Dizziness and weakness   Immunizations Administered    Name Date Dose VIS Date Route   JANSSEN COVID-19 VACCINE 04/11/2020  9:02 AM 0.5 mL 12/05/2019 Intramuscular   Manufacturer: [employer]   Lot: [id number]   NDC: 91638-466-59

## 2020-04-12 ENCOUNTER — Other Ambulatory Visit: Payer: Self-pay

## 2020-04-12 ENCOUNTER — Ambulatory Visit (INDEPENDENT_AMBULATORY_CARE_PROVIDER_SITE_OTHER): Payer: No Payment, Other | Admitting: Licensed Clinical Social Worker

## 2020-04-12 DIAGNOSIS — F411 Generalized anxiety disorder: Secondary | ICD-10-CM | POA: Diagnosis not present

## 2020-04-12 DIAGNOSIS — F200 Paranoid schizophrenia: Secondary | ICD-10-CM
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
## 2020-04-12 NOTE — Patient Instructions (Signed)
Supporting Someone With Schizophrenia If you are a friend or family member of someone diagnosed with schizophrenia, you can play a helpful role in that person's life. Understanding as much as you can about schizophrenia will help you to be as supportive as possible. What do I need to know about this condition? Schizophrenia is a lifelong mental illness that causes episodes of severe loss of contact with reality (psychosis). It may interfere with personal relationships or normal daily activities. Symptoms of schizophrenia may be continuous, or they may come and go. Some common symptoms include:  Seeing, hearing, or feeling things that are not there (hallucinations).  Having fixed, false beliefs (delusions). These often involve a person believing that he or she is being attacked, harassed, cheated, persecuted, or plotted against.  Speech or behavior that is odd or disorganized or does not make sense to others (incoherent). What do I need to know about the treatment options? Schizophrenia requires long-term treatment. Treatment may include:  Medicines, such as antipsychotics. Other medicines may be added depending on the type and severity of the person's symptoms.  Counseling or talk therapy. Individual, group, or family counseling may be helpful in providing education, support, and guidance. Many people with schizophrenia also benefit from social skills and job skills (vocational) training.  Electroconvulsive therapy (ECT). This procedure involves applying short electrical pulses to the brain through the scalp to change brain chemicals (neurotransmitters). ECT may be used to treat people with severe symptoms if other treatments are not effective. A combination of medicine and counseling is usually best for managing this disorder over time. How can I support my loved one? Talk about the condition Good communication can be helpful in supporting your friend or family member. Here are a few things to  keep in mind:  Be careful about too much prodding. Try not to overdo reminders to an adult friend or family member about things like taking medicines. Ask how your loved one prefers that you help.  Be patient, listen well, and speak encouraging words.  Be available if your loved one wants to talk. Make an effort to acknowledge his or her feelings. Find support and resources A health care provider may be able to recommend mental health resources that are available online or over the phone. You could start with:  Substance Abuse and Mental Health Services Administration [facility]): ktimeonline.com  National Alliance on Mental Illness (NAMI): www.nami.org Think about joining self-help and support groups, not only for your friend or family member, but also for yourself. People in these peer and family support groups understand what you and your loved one are going through. They can help you feel a sense of hope and connect you with local resources to help you learn more. You could also try family or couples therapy. General support  Make an effort to learn all you can about schizophrenia.  Ask your loved one how you can best help him or her. Offer to help with daily responsibilities, such as laundry or meals.  Help your loved one follow his or her treatment plan as directed by health care providers. This could mean driving him or her to therapy sessions or suggesting ways to cope with stress, such as going for a walk together.  Do not argue with your loved one about his or her perception of reality. How can I create a safe environment?  Create a written crisis plan. Include important phone numbers, such as the local crisis intervention team. Make sure that: ? The person  with schizophrenia knows about this plan. ? Everyone who has regular contact with that person knows about the plan and knows what to do in an emergency.  Be aware that sometimes there is a risk of violence toward you when a  person has schizophrenia. Set limits when it comes to physical or verbal abuse. One thing you could do is tell your loved one that if he or she is getting violent, you will leave and then call the police. Talk ahead of time about your plans for handling violent situations. Doing that will help to make violent episodes easier to manage if they happen.  To lower the risk of violence or suicide during a crisis, remove or lock up guns and other weapons. If you do not have a safe place to keep a gun, local law enforcement may store a gun for you. How should I care for myself? Supporting someone with schizophrenia can cause stress. Be sure to find ways to take care of your body, mind, and well-being.  Find someone you can talk to who will also help you develop coping skills to manage stress.  Try different techniques for coping with stress, such as: ? Muscle relaxation, meditation, and breathing exercises. ? Exercise, even if it is just taking a short walk a few times a week. ? Getting the right amount of good-quality sleep.  Try to maintain your normal routines. This can help you remember that your life is about more than your loved one's condition.  Make time for activities that help you relax, and try to not feel guilty about taking time for yourself. What are some signs that the condition is getting worse? Warning signs that your loved one's condition is getting worse include:  Clear, sudden change in behavior or symptoms.  Struggling with daily activities.  Increased use of drugs or alcohol.  Not being able to fulfill basic needs (food, clothing, shelter). Warning signs that your loved one is thinking about suicide include:  Withdrawing from friends and family.  Talking about suicide or searching for methods.  Expressing feelings of being trapped or not having a purpose in life. Ask a counselor or your loved one's health care provider about when to get help if you are concerned about  behavior changes. Privacy laws limit how much a health care provider can share with you without your loved one's permission, but if you feel that a situation is an emergency, do not wait to call a health care provider or emergency services. Get help right away if:  You are in a situation that threatens your life. Leave the situation and call emergency services (911 in the [place] as soon as possible. If you ever feel like your loved one may hurt himself or herself or others, or may have thoughts about taking his or her own life, get help right away. You can go to your nearest emergency department or call:  Your local emergency services (911 in the [place]  A suicide crisis helpline, such as the [place] at 225-487-3805. This is open 24 hours a day. Summary  Schizophrenia is a lifelong illness that causes episodes of severe loss of contact with reality (psychosis).  Treatment for schizophrenia is ongoing, and it may include medicines, counseling, therapy, support groups, and social skills or job [employer].  Support that you give to your loved one is an important part of a successful treatment plan.  Make sure to take care of yourself while supporting someone with  schizophrenia. Get help when you need it.  Expect episodes, and have a plan for how to help your loved one manage them. If you feel threatened or are worried about your loved one's safety, contact your local emergency services as soon as possible. This information is not intended to replace advice given to you by your health care provider. Make sure you discuss any questions you have with your health care provider. Document Revised: 01/15/2019 Document Reviewed: 02/05/2017 Elsevier Patient Education  [place] With Anxiety Anxiety disorders are mental health conditions that cause overwhelming feelings of nervousness or worry. These feelings interfere with daily activities  and relationships. Anxiety disorders include:  Generalized anxiety disorder (GAD).  Social anxiety.  Post-traumatic stress disorder (PTSD). When a person has an anxiety disorder, his or her condition can affect others around him or her, such as friends and family members. Friends and family can help by offering support and understanding. What do I need to know about this condition? Anxiety is the mental and physical experience of nervousness or worry that you might feel when you think about a stressful event. Occasional anxiety is normal, but a person with an anxiety disorder becomes preoccupied with this worry. He or she may know that the anxiety is not logical, but knowing this does not relieve the discomfort that he or she feels. Anxiety disorders cause a great deal of distress and prevent someone from having a normal daily life. Someone with an anxiety disorder may:  Experience anxiety that: ? May or may not have a specific trigger. ? Lasts for long periods of time. ? Causes physical problems over time. ? Is far more intense than normal anticipation. ? Occurs at unpredictable times.  Feel restless or edgy.  Get fatigued easily.  Have trouble focusing.  Have muscle tension.  Have trouble falling asleep or staying asleep.  Be irritable and occasionally have sudden expressions of strong feelings (outbursts).  Have worries that do not make sense to you. What do I need to know about the treatment options? Anxiety disorders are generally very treatable by mental health providers such as psychologists, psychiatrists, and clinical social workers. Treatment may include one or more of the following:  Psychotherapy, also called talk therapy or counseling. Types of psychotherapy that are used to treat anxiety include: ? Cognitive behavioral therapy (CBT). This type of therapy teaches a person how to recognize unhealthy feelings, thoughts, and behaviors, and how to replace those feelings  with positive thoughts and actions. ? Behavior therapy that trains a person to relax and self-soothe. This also involves gradually exposing the person to the cause of the anxiety (progressive exposure therapy). ? Biofeedback. This type of therapy focuses on trying to control certain body functions, like heart rate, to lessen the physical impact of anxiety. ? Mindfulness-based stress reduction training. This uses education, meditation, and yoga to help a person stay focused on the present instead of living in the past or worrying about the future. ? Acceptance and commitment therapy (ACT). This helps a person to focus on acceptance, rather than trying to control every situation. ? Family therapy. This treatment helps family members to communicate and deal with conflict in healthy ways.  Medicine to treat anxiety and help to control certain emotions and behaviors.  Mind-body programs. These programs encourage the person with anxiety to be involved in his or her treatment and feel empowered. Mind-body programs may include mindfulness-based stress reduction training, yoga, or tai chi. How can I  create a safe environment?  For certain types of anxiety, such as PTSD, you may want to: ? Remove alcohol and prescription medicines from your loved one's home, or limit the amount of these substances in the home. This can help to prevent your loved one from abusing alcohol and prescription medicines. ? Remove or lock up guns and other weapons. If you do not have a safe place to keep a gun, local law enforcement may store a gun for you.  Make a written crisis plan. Include important phone numbers, such as the local crisis intervention team. Make sure that: ? The person with anxiety knows about this plan and agrees with it. ? Everyone who has regular contact with the person knows about the plan and knows what to do in an emergency. ? The written plan is easily accessible and can be quickly put into action. How  should I care for myself? It is important to find ways to care for your body, mind, and well-being while supporting someone with anxiety.  Try to maintain your normal routines. This can help you remember that your life is about more than your loved one's condition.  Understand what your limits are. Say "no" to requests or events that lead to a schedule that is too busy.  Make time for activities that help you relax, and try to not feel guilty about taking time for yourself.  Spend time with friends and family.  Consider trying meditation and deep breathing exercises to lower your stress. Attend some mind-body classes by yourself or with your loved one.  Get plenty of sleep.  Exercise, even if it is just taking a short walk a few times a week.  If you are struggling emotionally with guilt, fear, or anger, consider working with a therapist. What are some signs that the condition is getting worse? Signs that your loved one's condition may be getting worse include:  Dramatic mood swings.  Staying away from activities that he or she used to enjoy.  Drinking more alcohol than normal.  Either seeming tearful or seeming to lack emotion.  Talking about "not feeling right."  Staying away from others (isolating himself or herself). Where to find support Talk about the condition Good communication is the key to supporting your friend or family member. Here are a few things to keep in mind:  Be careful about too much prodding. Try not to overdo reminders to an adult friend or family member about things like taking medicines. Ask how your loved one prefers that you help.  Ask questions and then listen to your loved one's response. Be available if your friend or family member wants to talk, but give your loved one space if he or she does not feel like talking.  Never ignore comments about suicide, and do not try to avoid the subject of suicide. Talking about suicide will not make your loved  one want to act on it. You or your loved one can reach out 24 hours a day to get free, private support (on the phone or a live online chat) from a suicide crisis helpline, such as the [place] at (857)276-1070.  Be encouraging and offer emotional support. This can help to lower stress. Even saying something simple to comfort your loved one may help.  If your loved one is open to it, go with him or her to visits with a counselor or health care provider. Get suggestions directly from your loved one's care providers about  when to get help if you are concerned about behavior changes. Privacy laws limit how much a person's health care provider can share with you without your loved one's permission, but if you feel that a situation is an emergency, do not wait to call a health care provider or emergency services. Find support and resources  Consider joining self-help and support groups, not only for your friend or family member, but also for yourself. People in these peer and family support groups understand what you and your loved one are going through. They can help you feel a sense of hope and connect you with local resources to help you learn more. ? You may also consider family therapy. General support  Make an effort to learn all you can about your loved one's form of anxiety.  Include your loved one in activities. Invite him or her to go for walks and outings.  Help your loved one follow his or her treatment plan as directed by health care providers. This could mean driving him or her to therapy sessions or suggesting ways to cope with stress.  Remember that your support really matters. Social support is a huge benefit for someone who is coping with anxiety. Where to find more information A health care provider may be able to recommend mental health resources that are available online or over the phone. You could start with:  Government sites such as the Substance  Abuse and [place] (SAMHSA): ktimeonline.com  National mental health organizations such as the [employer] on [employer] [place]): www.nami.org Get help right away if:  Your loved one expresses thoughts about harming himself or herself or others.  Your loved one's behavior becomes hard to predict (erratic).  Your loved one shows behavior that does not make sense with the current time, place, or circumstances. These behaviors may include seeing, feeling, tasting, or hearing things that are not real (hallucinations) or having flashbacks. If you ever feel like your loved one may hurt himself or herself or others, or may have thoughts about taking his or her own life, get help right away. You can go to your nearest emergency department or call:  Your local emergency services (911 in the [place]  A suicide crisis helpline, such as the [place] at (815)612-1648. This is open 24 hours a day. Summary  People with anxiety disorders experience overwhelming feelings of nervousness or worry. Friends and family can help by offering support and understanding.  Anxiety disorders are generally very treatable by mental health providers. They can be treated with psychotherapy (also known as talk therapy), behavior therapy, medicine, and mind-body programs.  Be compassionate and listen to your loved one. Be available if your friend or family member wants to talk, but give your loved one space if he or she does not feel like talking.  Find ways to care for your own body, mind, and well-being while supporting someone with anxiety. Try to maintain your normal routines and make time to do things that you enjoy. This information is not intended to replace advice given to you by your health care provider. Make sure you discuss any questions you have with your health care provider. Document Revised: 01/15/2019 Document Reviewed: 02/05/2017 Elsevier  Patient Education  [place].  Managing Schizophrenia If you have been diagnosed with schizophrenia, you may be relieved to know why you have felt or behaved a certain way. Still, you may have questions about the treatment ahead, how to  get the support you need, and how to deal with the condition day-to-day. With care and support, you can learn to manage your symptoms and live with schizophrenia. How to manage lifestyle changes Managing stress Stress is your body's reaction to life changes and events, both good and bad. For people with schizophrenia, stress can sometimes cause an episode to start (can be a trigger), so it is important to learn ways to manage stress. Your health care provider, therapist, or counselor may suggest some techniques such as:  Meditation, muscle relaxation, and breathing exercises.  Music therapy. This can include creating music or listening to music.  Life skills training. This training is focused on work, self-care, money, house management, and social skills. Other things you can do to manage stress include:  Keeping a stress diary. This can help you learn what causes your stress to start and how you can control your response to those triggers.  Exercising. Even a short daily walk can help.  Getting enough sleep.  Making a schedule to manage your time. Knowing what you will do from day to day helps you avoid feeling overwhelmed by tasks and deadlines.  Spending time on hobbies you enjoy that help you relax.  Medicines Antipsychotic medicines are usually prescribed to help manage this condition. Make sure you:  Talk with your pharmacist or health care provider about all medicines that you take, the possible side effects, and which medicines are safe to take together.  Make it your goal to take part in all treatment decisions (shared decision-making). Ask about possible side effects of medicines that your health care provider recommends, and tell him  or her how you feel about having those side effects. It is best if shared decision-making with your health care provider is part of your total treatment plan. Relationships Having the support of your family and friends can play a major role in the success of your treatment. The following steps can help you maintain healthy relationships:  Think about going to couples therapy, family therapy, or family education classes.  Create a written plan for your treatment, and include close family members and friends in the process.  Consider bringing your partner or another family member or friend to the appointments you have with your health care provider. How to recognize changes in your condition If you find that your condition is getting worse, talk to your health care provider right away. Watch for these signs:  You hear, see, taste, and things that others do not (hallucinate).  You cannot set aside persistent, unusual thoughts or beliefs, no matter what others may believe.  Your speech becomes unclear.  You withdraw from friends and family members.  You have racing thoughts and have trouble thinking clearly or staying focused.  You have poor personal hygiene, weight gain or weight loss, or changes in how you are sleeping or eating. Follow these instructions at home:  Take over-the-counter and prescription medicines only as told by your health care provider. Do not start new medicines or stop taking medicines before you ask your health care provider if it is safe to make those changes.  Avoid caffeine, alcohol, and drugs. They can affect how your medicine works and can make your symptoms worse.  Eat a healthy diet.  Look for support groups in your area so you can meet other people with your condition. You will learn different methods that others have used to manage schizophrenia.  Keep all follow-up visits as told by your health care  provider, therapist, or counselor. This is  important. Where to find support Talking to others  Reach out to trusted friends or family members, explain your condition, and let them know that you are working with a health care team.  Consider giving educational materials to friends and family.  If you are having trouble telling your friends and family about your condition, keep in mind that honest and open communication can make these conversations easier. Finances Be sure to check with your insurance carrier to find out what treatment options are covered by your plan. You may also be able to find financial assistance through not-for-profit organizations or with local government-based resources. If you are taking medicines, you may be able to get the generic form, which may be less expensive than brand-name medicine. Some makers of prescription medicines also offer help to patients who cannot afford the medicines that they need. Therapy and support groups  Make sure you find a counselor or therapist who is familiar with schizophrenia. Meet with your counselor or therapist once a week or more often if needed.  Find support programs for people with schizophrenia. Where to find more information [employer] on Mental Illness: www.nami.org Contact a health care provider if:  You are not able to take your medicines as prescribed.  Your symptoms get worse. Get help right away if:  You have serious thoughts about hurting yourself or others. If you ever feel like you may hurt yourself or others, or have thoughts about taking your own life, get help right away. You can go to your nearest emergency department or call:  Your local emergency services (911 in the [place]  A suicide crisis helpline, such as the [place] at 929-071-5096. This is open 24 hours a day. Summary  Schizophrenia is a lifelong illness. It is best controlled with continuous treatment that includes medicine and therapy.  Learning  ways to deal with stress may help your treatment work better.  Having the support of your family and friends can help to make your treatment a success.  If you find that your condition is getting worse, talk to your health care provider right away. This information is not intended to replace advice given to you by your health care provider. Make sure you discuss any questions you have with your health care provider. Document Revised: 01/16/2019 Document Reviewed: 01/24/2017 Elsevier Patient Education  [place].

## 2020-05-03 ENCOUNTER — Encounter (HOSPITAL_COMMUNITY): Payer: Self-pay | Admitting: Psychiatry

## 2020-05-03 ENCOUNTER — Ambulatory Visit (INDEPENDENT_AMBULATORY_CARE_PROVIDER_SITE_OTHER): Payer: No Payment, Other | Admitting: Licensed Clinical Social Worker

## 2020-05-03 ENCOUNTER — Other Ambulatory Visit: Payer: Self-pay

## 2020-05-03 ENCOUNTER — Telehealth (HOSPITAL_COMMUNITY): Payer: Self-pay | Admitting: *Deleted

## 2020-05-03 ENCOUNTER — Ambulatory Visit (INDEPENDENT_AMBULATORY_CARE_PROVIDER_SITE_OTHER): Payer: No Payment, Other | Admitting: Psychiatry

## 2020-05-03 DIAGNOSIS — F411 Generalized anxiety disorder: Secondary | ICD-10-CM | POA: Diagnosis not present

## 2020-05-03 DIAGNOSIS — F200 Paranoid schizophrenia: Secondary | ICD-10-CM | POA: Diagnosis not present

## 2020-05-03 MED ORDER — TRAZODONE HCL 100 MG PO TABS: 100.0000 mg | ORAL_TABLET | Freq: Every day | ORAL | 2 refills | Status: DC

## 2020-05-03 MED ORDER — BUSPIRONE HCL 10 MG PO TABS: 10.0000 mg | ORAL_TABLET | Freq: Three times a day (TID) | ORAL | 2 refills | Status: DC

## 2020-05-03 MED ORDER — LURASIDONE HCL 40 MG PO TABS: 40.0000 mg | ORAL_TABLET | Freq: Every day | ORAL | 2 refills | Status: DC

## 2020-05-03 NOTE — Telephone Encounter (Signed)
Call from [facility] stating patients RX for Buspar is a TID sig but quantity is only for #30. Asking NP to clarify sig and or quantity.

## 2020-05-03 NOTE — Progress Notes (Signed)
   THERAPIST PROGRESS NOTE  Session Time: 53 Therapist Response:     Subjective/Objective: Curtis Mcdonald was alert and oriented x 5, dressed casually, and was well groomed. He was engaged throughout follow up visit.  Pt reports that overall, he is doing "good". He is taking all medication as directed and reports that his paranoia "feeling something bad might happen" have been consistent 3 to 4 days per week for 1 to 2 hours. He reports non command hallucination with paranoid thoughts. Curtis Mcdonald has had an increase in anxiety symptoms but states this could be in regard to his application process for social security disability. Pt did provide form to LCSW in which Nurse practitioner reviewed and was signed by supervising doctor then given back to pt. Pt application at this time is still pending, but pt has now completed all steps needed by social security office to review his application. Curtis Mcdonald has been talking to his sister 1 to 2 times per month for check ins as she is patient primary support system although she is located 1 to 2 hours out of town where pt lives.    Assessment/plan: Pt endorses increase in symptoms of anxiety worry, irritability, and tension. He also reports paranoia 4 times weekly for a duration of two hours which pt states "feels like something awful might happen". Curtis Mcdonald along with paranoia is experiencing non command hallucination. He currently meets criteria for Generalized anxiety disorder and paranoid schizophrenia. Plan for pt will be to follow up with medication provider as needed, take all medication as prescribed, read bible daily, and attend 1 religious gathering per week.     Participation Level: Active  Behavioral Response: CasualAlertHopeless and Worthless  Type of Therapy: Family Therapy  Treatment Goals addressed: Diagnosis: paranoid  schizophrenia   Interventions: Other: Narrative   Summary: Curtis Mcdonald is a 64 y.o. male who presents with anxiety and paranoid  schizophrenia.   Suicidal/Homicidal: NAwithout intent/plan  Plan: Return again in 4 weeks.  Diagnosis: Axis I: Generalized Anxiety Disorder and schizophrenia     Curtis Cooks, LCSW 05/03/2020

## 2020-05-03 NOTE — Progress Notes (Signed)
BH MD/PA/NP OP Progress Note  05/03/2020 8:16 AM STAR CHEESE  MRN:  [id number]  Chief Complaint: "I feel anxious everyday"  HPI: 64 year old male seen today for initial psychiatric evaluation.  He has a psychiatric history of schizophrenia, anxiety, and cocaine abuse.  He is currently managed on Latuda 40 mg daily and trazodone 100 mg at bedtime. Today he notes that he has been experiencing increased anxiety.   Today he denies symptoms of depression. He however notes that he has increased anxiety. He notes that he worries about what could go wrong. Writer asked patient if things generally goes well in his life and he notes that they do however notes that his anxiety persist. He notes to calm himself down he reads the bible and watch television. Patient also note that he has AH. He states that his voice urges him to stay on track.  He denies SI/HI/VH.     Patient is agreeable to start Buspar 10 mg TID to help manage symptoms of anxiety. He will continue all other medications as prescribed.   No other concerns noted at this time Visit Diagnosis:    ICD-10-CM   1. Generalized anxiety disorder  F41.1 busPIRone (BUSPAR) 10 MG tablet  2. Paranoid schizophrenia ([place])  F20.0 lurasidone (LATUDA) 40 MG TABS tablet    traZODone (DESYREL) 100 MG tablet    Past Psychiatric History:  schizophrenia, anxiety, and cocaine abuse  Past Medical History: No past medical history on file.  Past Surgical History:  Procedure Laterality Date  . WISDOM TOOTH EXTRACTION      Family Psychiatric History: Schizophrenia, anxiety, cocaine abuse  Family History:  Family History  Problem Relation Age of Onset  . CAD Neg Hx   . Diabetes Neg Hx     Social History:  Social History   Socioeconomic History  . Marital status: Single    Spouse name: Not on file  . Number of children: Not on file  . Years of education: Not on file  . Highest education level: Not on file  Occupational History  . Occupation: Atm  assembly    Comment: [employer]  Tobacco Use  . Smoking status: Never Smoker  . Smokeless tobacco: Never Used  Substance and Sexual Activity  . Alcohol use: No    Comment: fomer   . Drug use: No  . Sexual activity: Not Currently  Other Topics Concern  . Not on file  Social History Narrative  . Not on file   Social Determinants of Health   Financial Resource Strain: Low Risk   . Difficulty of Paying Living Expenses: Not very hard  Food Insecurity: No Food Insecurity  . Worried About Charity fundraiser in the Last Year: Never true  . Ran Out of Food in the Last Year: Never true  Transportation Needs: No Transportation Needs  . Lack of Transportation (Medical): No  . Lack of Transportation (Non-Medical): No  Physical Activity: Inactive  . Days of Exercise per Week: 0 days  . Minutes of Exercise per Session: 0 min  Stress: Stress Concern Present  . Feeling of Stress : To some extent  Social Connections: Moderately Isolated  . Frequency of Communication with Friends and Family: Never  . Frequency of Social Gatherings with Friends and Family: Never  . Attends Religious Services: More than 4 times per year  . Active Member of Clubs or Organizations: Yes  . Attends Archivist Meetings: More than 4 times per year  .  Marital Status: Separated    Allergies: No Known Allergies  Metabolic Disorder Labs: Lab Results  Component Value Date   HGBA1C 5.1 04/12/2016   MPG 100 04/12/2016   No results found for: PROLACTIN Lab Results  Component Value Date   CHOL 136 04/12/2016   TRIG 68 04/12/2016   HDL 38 (L) 04/12/2016   CHOLHDL 3.6 04/12/2016   VLDL 14 04/12/2016   LDLCALC 84 04/12/2016   LDLCALC 94 01/11/2010   Lab Results  Component Value Date   TSH 0.330 (L) 04/12/2016    Therapeutic Level Labs: No results found for: LITHIUM No results found for: VALPROATE No components found for:  CBMZ  Current Medications: Current Outpatient Medications  Medication Sig  Dispense Refill  . busPIRone (BUSPAR) 10 MG tablet Take 1 tablet (10 mg total) by mouth 3 (three) times daily. 30 tablet 2  . lurasidone (LATUDA) 40 MG TABS tablet Take 1 tablet (40 mg total) by mouth daily with breakfast. 30 tablet 2  . traZODone (DESYREL) 100 MG tablet Take 1 tablet (100 mg total) by mouth at bedtime. 30 tablet 2   No current facility-administered medications for this visit.     Musculoskeletal: Strength & Muscle Tone: within normal limits Gait & Station: normal Patient leans: N/A  Psychiatric Specialty Exam: Review of Systems  There were no vitals taken for this visit.There is no height or weight on file to calculate BMI.  General Appearance: Well Groomed  Eye Contact:  Good  Speech:  Clear and Coherent and Normal Rate  Volume:  Normal  Mood:  Anxious  Affect:  Congruent  Thought Process:  Coherent, Goal Directed and Linear  Orientation:  Full (Time, Place, and Person)  Thought Content: WDL, Logical and Hallucinations: Auditory   Suicidal Thoughts:  No  Homicidal Thoughts:  No  Memory:  Immediate;   Good Recent;   Good Remote;   Good  Judgement:  Good  Insight:  Good  Psychomotor Activity:  Normal  Concentration:  Concentration: Good and Attention Span: Good  Recall:  Good  Fund of Knowledge: Good  Language: Good  Akathisia:  No  Handed:  Right  AIMS (if indicated): Not done  Assets:  Communication Skills Desire for Improvement Financial Resources/Insurance Housing  ADL's:  Intact  Cognition: WNL  Sleep:  Good   Screenings: PHQ2-9     Counselor from 03/30/2020 in [facility] Office Visit from 05/22/2016 in [place]  PHQ-2 Total Score 2 0  PHQ-9 Total Score 4 --       Assessment and Plan: Patient endorses increasing anxiety. He is agreeable to starting Buspar 10 mg TID to help manage symptoms. He will continue all other medication as prescribed.    1. Paranoid schizophrenia  ([place])  Continue- lurasidone (LATUDA) 40 MG TABS tablet; Take 1 tablet (40 mg total) by mouth daily with breakfast.  Dispense: 30 tablet; Refill: 2 Continue- traZODone (DESYREL) 100 MG tablet; Take 1 tablet (100 mg total) by mouth at bedtime.  Dispense: 30 tablet; Refill: 2  2. Generalized anxiety disorder  Start- busPIRone (BUSPAR) 10 MG tablet; Take 1 tablet (10 mg total) by mouth 3 (three) times daily.  Dispense: 30 tablet; Refill: 2  Follow up in 2 month Follow up with threapy  Salley Slaughter, NP 05/03/2020, 8:16 AM

## 2020-05-03 NOTE — Telephone Encounter (Signed)
Quantity should be 90

## 2020-06-02 ENCOUNTER — Other Ambulatory Visit: Payer: Self-pay

## 2020-06-02 ENCOUNTER — Ambulatory Visit (INDEPENDENT_AMBULATORY_CARE_PROVIDER_SITE_OTHER): Payer: No Payment, Other | Admitting: Licensed Clinical Social Worker

## 2020-06-02 DIAGNOSIS — F200 Paranoid schizophrenia: Secondary | ICD-10-CM | POA: Diagnosis not present

## 2020-06-02 NOTE — Progress Notes (Signed)
   THERAPIST PROGRESS NOTE  Session Time: 59  Therapist Response:    Subjective/Objective: Pt was alert and oriented x 5. He was dressed casually with good eye contact. Pt presented to with flat & depressed mood/affect. Tymar did engage well throughout assessment as evidence by note listed below. Pt has been doing "good" overall. He reports he has been volunteering at [employer] weekly, watches T.V, reads the bible 2 times per week, and walks 1 time per week. [name] has been doing well with homework provided. He also reports that he talks with his sister monthly and she per pt reports that [name] has had an overall positive change in his mood/affect.    Assessment/Plan: Pt does endorse symptoms of paranoia, auditory hallucinations, and worry. He has had a decrease in insomnia symptoms from daily to 1 to 2 times per week. Plan moving forward will be to start journaling bible entries and how they connect to pt life. He will increase walking from 1 x weekly to 2 x weekly and try meditation before next appointment with "Daily Calm" video provided. Pt treatment plan has been updated to progressing.  Participation Level: Active  Behavioral Response: Casual and Fairly GroomedAlertflat/blunted   Type of Therapy: Individual Therapy  Treatment Goals addressed: Anxiety  Interventions: Solution Focused  Summary: Curtis Mcdonald is a 64 y.o. male who presents with paranoid schizophrenia.   Suicidal/Homicidal: Nowithout intent/plan     Plan: Return again in 2 weeks.     Weber Cooks, LCSW 06/02/2020

## 2020-06-16 ENCOUNTER — Ambulatory Visit (INDEPENDENT_AMBULATORY_CARE_PROVIDER_SITE_OTHER): Payer: No Payment, Other | Admitting: Licensed Clinical Social Worker

## 2020-06-16 ENCOUNTER — Other Ambulatory Visit: Payer: Self-pay

## 2020-06-16 DIAGNOSIS — F200 Paranoid schizophrenia: Secondary | ICD-10-CM

## 2020-06-16 DIAGNOSIS — F411 Generalized anxiety disorder: Secondary | ICD-10-CM

## 2020-06-16 NOTE — Progress Notes (Signed)
   THERAPIST PROGRESS NOTE  Session Time: 51  Therapist Response:   Subjective/Objective:  Pt was alert and oriented x 5. He was dressed casually and engaged well throughout session as evidence by note written below. Pt present with flat affect/mood.   Pt present today stating that he completed all his homework. Pt started walking around the block 3 x per week. Currently pt wants to keep at 3 x per week without an increase. He also has been reading his bible 1 to 2 times per day and journaling about the passages he reads about. Mostly about how they connect to his life. Curtis Mcdonald has been in contact with his sister 1 x per month via phone and 1 x per month in person as well. Overall pt is feeling good about his treatment.   Pt does report some panic and anxiety when he in the morning. This is due to him worried that he is not reading enough of his bible. Despite his reported anxiety pt scored a 7 on the GAD-7.    Assessment/Plan:  Pt endorses decrease in symptoms for auditory hallucinations to 1 time per week and a decrease in paranoia from daily to 4 to 5 days per week. He does also have anxiety driven symptoms for tension, worry, and minor panic attacks. This is in the mornings and due to pt contemplation of if he has read enough of his bible. Pt does meet criteria for MDD and Paranoid schizophrenia. Plan moving forward walk 3 x per week. Journal daily about bible study, talk with sister 1 x per month, and add in talking to mother 1 x per month. Pt will decrease from biweekly sessions with LCSW to 1 x monthly.   Participation Level: Active  Behavioral Response: CasualAlertDepressed  Type of Therapy: Individual Therapy  Treatment Goals addressed: Anxiety  Interventions: CBT and Supportive  Summary: Curtis Mcdonald is a 64 y.o. male who presents with GAD and paranoid schizophrenia.   Suicidal/Homicidal: Nowithout intent/plan   Plan: Return again in 2 weeks.     Weber Cooks,  LCSW 06/16/2020

## 2020-06-30 ENCOUNTER — Ambulatory Visit (INDEPENDENT_AMBULATORY_CARE_PROVIDER_SITE_OTHER): Payer: No Payment, Other | Admitting: Licensed Clinical Social Worker

## 2020-06-30 ENCOUNTER — Other Ambulatory Visit: Payer: Self-pay

## 2020-06-30 DIAGNOSIS — F200 Paranoid schizophrenia: Secondary | ICD-10-CM

## 2020-06-30 NOTE — Progress Notes (Signed)
° °  THERAPIST PROGRESS NOTE  Session Time: 5  Therapist Response:    Subjective/Objective: Pt was alert and oriented x 5. He presented today with euthymic mood/affect. He was dressed casually and engaged well as evidence by note written below.   Pt reports that overall, he is doing "well". He states that he has been utilize good coping techniques. As evidence by walking 3 x weekly for 30 minutes, attending church 1 x weekly, reading the bible 2 x daily, and journaling daily. Pt PHQ-9 score is now a 5 and his GAD-7 at a 56. Curtis Mcdonald also states that he has been able to talk on the phone with his mother and sister 1 to 2 x monthly. He reports that this has really given him an outlet and has reconnected him with his family     Assessment/plan: Pt endorses a decrease in symptoms of auditory hallucinations about 1 x weekly, no visual hallucinations or delusions were reported. [name] declined any paranoia and reports no mood symptoms at this time. Pt has been taking all his medications as directed. Plan moving forward 2 more therapy sessions and if pt still is continuing to progress he has met goals for therapy. As evidence by utilizing 3 coping skills and a decrease in GAD-7 and PHQ-9 scores.  Participation Level: Active  Behavioral Response: CasualAlertEuthymic  Type of Therapy: Individual Therapy  Treatment Goals addressed: Diagnosis: paranoid schziophrenia   Interventions: Solution Focused and Supportive  Summary: Curtis Mcdonald is a 64 y.o. male who presents with paranoid schizophrenia .   Suicidal/Homicidal: Nowithout intent/plan   Plan: Return again in 5 weeks.     Dory Horn, LCSW 06/30/2020

## 2020-07-04 ENCOUNTER — Other Ambulatory Visit: Payer: Self-pay

## 2020-07-04 ENCOUNTER — Ambulatory Visit (INDEPENDENT_AMBULATORY_CARE_PROVIDER_SITE_OTHER): Payer: No Payment, Other | Admitting: Psychiatry

## 2020-07-04 ENCOUNTER — Encounter (HOSPITAL_COMMUNITY): Payer: Self-pay | Admitting: Psychiatry

## 2020-07-04 DIAGNOSIS — F200 Paranoid schizophrenia: Secondary | ICD-10-CM | POA: Diagnosis not present

## 2020-07-04 DIAGNOSIS — F411 Generalized anxiety disorder: Secondary | ICD-10-CM | POA: Diagnosis not present

## 2020-07-04 MED ORDER — BUSPIRONE HCL 10 MG PO TABS: 10.0000 mg | ORAL_TABLET | Freq: Three times a day (TID) | ORAL | 2 refills | Status: DC

## 2020-07-04 MED ORDER — HYDROXYZINE HCL 10 MG PO TABS: 10.0000 mg | ORAL_TABLET | Freq: Three times a day (TID) | ORAL | 2 refills | Status: DC | PRN

## 2020-07-04 MED ORDER — LURASIDONE HCL 40 MG PO TABS: 40.0000 mg | ORAL_TABLET | Freq: Every day | ORAL | 2 refills | Status: DC

## 2020-07-04 MED ORDER — TRAZODONE HCL 100 MG PO TABS: 100.0000 mg | ORAL_TABLET | Freq: Every day | ORAL | 2 refills | Status: DC

## 2020-07-04 NOTE — Progress Notes (Signed)
BH MD/PA/NP OP Progress Note  07/04/2020 8:43 AM Curtis Mcdonald  MRN:  [id number]  Chief Complaint: "I still hear muffled voices and I still have some anxiety" Chief Complaint    Medication Management    "  HPI: 64 year old male seen today for follow up psychiatric evaluation.  He has a psychiatric history of schizophrenia, anxiety, and cocaine abuse.  He is currently managed on Latuda 40 mg daily and trazodone 100 mg at bedtime. Today he notes that he has been experiencing increased anxiety.   On exam he is pleasant, cooperative, engaged in conversation, and maintained eye contacting. Today he denies symptoms of depression. He informed provider that his anxiety has improved since starting Buspar however notes he is still anxious most days about thing good or bad occurring. To cope he notes he reads the bible and watch television. Patient also note that he has AH. He states that his voice urges him to stay on track he notes that he is okay with his voices and notes that he does not want his Latuda adjusted at this time.   He denies SI/HI/VH or paranoia.     Patient is agreeable to start hydroxyzine10 mg TID to help manage symptoms of anxiety. He will continue all other medications as prescribed.   No other concerns noted at this time Visit Diagnosis:    ICD-10-CM   1. Generalized anxiety disorder  F41.1 busPIRone (BUSPAR) 10 MG tablet    hydrOXYzine (ATARAX/VISTARIL) 10 MG tablet  2. Paranoid schizophrenia ([place])  F20.0 lurasidone (LATUDA) 40 MG TABS tablet    traZODone (DESYREL) 100 MG tablet    Past Psychiatric History:  schizophrenia, anxiety, and cocaine abuse  Past Medical History: No past medical history on file.  Past Surgical History:  Procedure Laterality Date  . WISDOM TOOTH EXTRACTION      Family Psychiatric History: Schizophrenia, anxiety, cocaine abuse  Family History:  Family History  Problem Relation Age of Onset  . CAD Neg Hx   . Diabetes Neg Hx     Social  History:  Social History   Socioeconomic History  . Marital status: Single    Spouse name: Not on file  . Number of children: Not on file  . Years of education: Not on file  . Highest education level: Not on file  Occupational History  . Occupation: Atm assembly    Comment: [employer]  Tobacco Use  . Smoking status: Never Smoker  . Smokeless tobacco: Never Used  Substance and Sexual Activity  . Alcohol use: No    Comment: fomer   . Drug use: No  . Sexual activity: Not Currently  Other Topics Concern  . Not on file  Social History Narrative  . Not on file   Social Determinants of Health   Financial Resource Strain: Low Risk   . Difficulty of Paying Living Expenses: Not very hard  Food Insecurity: No Food Insecurity  . Worried About Charity fundraiser in the Last Year: Never true  . Ran Out of Food in the Last Year: Never true  Transportation Needs: No Transportation Needs  . Lack of Transportation (Medical): No  . Lack of Transportation (Non-Medical): No  Physical Activity: Insufficiently Active  . Days of Exercise per Week: 3 days  . Minutes of Exercise per Session: 30 min  Stress: No Stress Concern Present  . Feeling of Stress : Only a little  Social Connections: Moderately Isolated  . Frequency of Communication with Friends and  Family: Once a week  . Frequency of Social Gatherings with Friends and Family: Never  . Attends Religious Services: More than 4 times per year  . Active Member of Clubs or Organizations: No  . Attends Archivist Meetings: 1 to 4 times per year  . Marital Status: Divorced    Allergies: No Known Allergies  Metabolic Disorder Labs: Lab Results  Component Value Date   HGBA1C 5.1 04/12/2016   MPG 100 04/12/2016   No results found for: PROLACTIN Lab Results  Component Value Date   CHOL 136 04/12/2016   TRIG 68 04/12/2016   HDL 38 (L) 04/12/2016   CHOLHDL 3.6 04/12/2016   VLDL 14 04/12/2016   LDLCALC 84 04/12/2016   LDLCALC  94 01/11/2010   Lab Results  Component Value Date   TSH 0.330 (L) 04/12/2016    Therapeutic Level Labs: No results found for: LITHIUM No results found for: VALPROATE No components found for:  CBMZ  Current Medications: Current Outpatient Medications  Medication Sig Dispense Refill  . busPIRone (BUSPAR) 10 MG tablet Take 1 tablet (10 mg total) by mouth 3 (three) times daily. 90 tablet 2  . lurasidone (LATUDA) 40 MG TABS tablet Take 1 tablet (40 mg total) by mouth daily with breakfast. 30 tablet 2  . traZODone (DESYREL) 100 MG tablet Take 1 tablet (100 mg total) by mouth at bedtime. 30 tablet 2  . hydrOXYzine (ATARAX/VISTARIL) 10 MG tablet Take 1 tablet (10 mg total) by mouth 3 (three) times daily as needed. 90 tablet 2   No current facility-administered medications for this visit.     Musculoskeletal: Strength & Muscle Tone: within normal limits Gait & Station: normal Patient leans: N/A  Psychiatric Specialty Exam: Review of Systems  Blood pressure 120/78, pulse 73, height _0  (1.753 m), weight 152 lb (68.9 kg), SpO2 99 %.Body mass index is 22.45 kg/m.  General Appearance: Well Groomed  Eye Contact:  Good  Speech:  Clear and Coherent and Normal Rate  Volume:  Normal  Mood:  Anxious  Affect:  Congruent  Thought Process:  Coherent, Goal Directed and Linear  Orientation:  Full (Time, Place, and Person)  Thought Content: WDL, Logical and Hallucinations: Auditory   Suicidal Thoughts:  No  Homicidal Thoughts:  No  Memory:  Immediate;   Good Recent;   Good Remote;   Good  Judgement:  Good  Insight:  Good  Psychomotor Activity:  Normal  Concentration:  Concentration: Good and Attention Span: Good  Recall:  Good  Fund of Knowledge: Good  Language: Good  Akathisia:  No  Handed:  Right  AIMS (if indicated): Not done  Assets:  Communication Skills Desire for Improvement Financial Resources/Insurance Housing  ADL's:  Intact  Cognition: WNL  Sleep:  Good    Screenings: GAD-7     Counselor from 06/16/2020 in [facility]  Total GAD-7 Score 6    PHQ2-9     Counselor from 06/30/2020 in [facility] Counselor from 06/16/2020 in [facility] Counselor from 03/30/2020 in [facility] Office Visit from 05/22/2016 in [place]  PHQ-2 Total Score 2 0 2 0  PHQ-9 Total Score _1 --       Assessment and Plan: Patient reports that his anxiety has improved since starting Buspar however notes that he is still anxious most days. He is agreeable to starting hydroxyzine 10 mg  TID to help  manage symptoms. He will continue all other medication as prescribed.    1. Generalized anxiety disorder  Continue- busPIRone (BUSPAR) 10 MG tablet; Take 1 tablet (10 mg total) by mouth 3 (three) times daily.  Dispense: 90 tablet; Refill: 2 Start- hydrOXYzine (ATARAX/VISTARIL) 10 MG tablet; Take 1 tablet (10 mg total) by mouth 3 (three) times daily as needed.  Dispense: 90 tablet; Refill: 2  2. Paranoid schizophrenia ([place])  Continue- lurasidone (LATUDA) 40 MG TABS tablet; Take 1 tablet (40 mg total) by mouth daily with breakfast.  Dispense: 30 tablet; Refill: 2 Continue- traZODone (DESYREL) 100 MG tablet; Take 1 tablet (100 mg total) by mouth at bedtime.  Dispense: 30 tablet; Refill: 2   Follow up in 3  month Follow up with threapy  Salley Slaughter, NP 07/04/2020, 8:43 AM

## 2020-08-17 ENCOUNTER — Ambulatory Visit (HOSPITAL_COMMUNITY): Payer: No Payment, Other | Admitting: Licensed Clinical Social Worker

## 2020-08-19 ENCOUNTER — Ambulatory Visit (INDEPENDENT_AMBULATORY_CARE_PROVIDER_SITE_OTHER): Payer: No Payment, Other | Admitting: Licensed Clinical Social Worker

## 2020-08-19 ENCOUNTER — Other Ambulatory Visit: Payer: Self-pay

## 2020-08-19 DIAGNOSIS — F209 Schizophrenia, unspecified: Secondary | ICD-10-CM | POA: Insufficient documentation

## 2020-08-19 DIAGNOSIS — F411 Generalized anxiety disorder: Secondary | ICD-10-CM | POA: Diagnosis not present

## 2020-08-19 DIAGNOSIS — F200 Paranoid schizophrenia: Secondary | ICD-10-CM | POA: Insufficient documentation

## 2020-08-19 NOTE — Progress Notes (Signed)
   THERAPIST PROGRESS NOTE  Session Time: 30  Therapist Response:   Subjective/Objective: Pt was alert and oriented x 5. He was dressed casually and engaged when questions were directed towards him. [name] presented today with flat mood/affect. He was cooperative and maintained good eye contact.   Pt reports overall feeling "well". He states that auditory hallucinations have decreased over the past several months. Primary stressor today is his anxiety. LCSW used supportive therapy to talk about his coping skills that he has been utilizing. He states that he is walking 3 x per week for 30 minutes. He is also reading his bible 2 x a day and writing about it afterwards. [name] attending church 1 to 2 x per week. LCSW spoke with pt about his plans for discharge as he has decreased his PHQ-9 score to 0 and is currently scoring a 7 on GAD-7. [name] reports wanting to move on past therapy but continue medication management. LCSW did let pt know that if new stressors occur, he is welcome to get a new assessment completed for reestablishing of goals. Curtis Mcdonald is agreeable to that plan.    Assessment/Plan:   Pt endorses auditory hallucination 1 to 2 per week for no more than 1 hour per episodes. Primary symptoms endorse are anxiety for tension and worry. He continues to meet criteria for GAD. Curtis Mcdonald states he is taking all his medications as directed. Plan moving forward is to discontinue therapy and pt has created 3 coping skills and decrease his GAD-7 and PHQ-9 score below a 10.  Participation Level: Minimal  Behavioral Response: CasualAlertflat   Type of Therapy: Individual Therapy  Treatment Goals addressed: Anxiety and Diagnosis: paranoid schziophrenia   Interventions: Supportive  Summary: Curtis Mcdonald is a 64 y.o. male who presents with GAD and paranoid schizophrenia   Suicidal/Homicidal: NAwithout intent/plan     Plan: Return again in 4 weeks.     Weber Cooks, LCSW 08/19/2020

## 2020-09-14 ENCOUNTER — Ambulatory Visit (HOSPITAL_COMMUNITY): Payer: Self-pay | Admitting: Licensed Clinical Social Worker

## 2020-10-03 ENCOUNTER — Encounter (HOSPITAL_COMMUNITY): Payer: No Payment, Other | Admitting: Psychiatry

## 2020-10-06 ENCOUNTER — Encounter (HOSPITAL_COMMUNITY): Payer: Self-pay | Admitting: Psychiatry

## 2020-10-06 ENCOUNTER — Ambulatory Visit (INDEPENDENT_AMBULATORY_CARE_PROVIDER_SITE_OTHER): Payer: No Payment, Other | Admitting: Psychiatry

## 2020-10-06 ENCOUNTER — Other Ambulatory Visit: Payer: Self-pay

## 2020-10-06 DIAGNOSIS — F200 Paranoid schizophrenia: Secondary | ICD-10-CM

## 2020-10-06 DIAGNOSIS — F411 Generalized anxiety disorder: Secondary | ICD-10-CM

## 2020-10-06 MED ORDER — TRAZODONE HCL 100 MG PO TABS: 100.0000 mg | ORAL_TABLET | Freq: Every day | ORAL | 2 refills | Status: DC

## 2020-10-06 MED ORDER — BUSPIRONE HCL 10 MG PO TABS: 10.0000 mg | ORAL_TABLET | Freq: Three times a day (TID) | ORAL | 2 refills | Status: DC

## 2020-10-06 MED ORDER — HYDROXYZINE HCL 10 MG PO TABS: 10.0000 mg | ORAL_TABLET | Freq: Three times a day (TID) | ORAL | 2 refills | Status: DC | PRN

## 2020-10-06 MED ORDER — LURASIDONE HCL 40 MG PO TABS: 40.0000 mg | ORAL_TABLET | Freq: Every day | ORAL | 2 refills | Status: DC

## 2020-10-06 NOTE — Progress Notes (Signed)
BH MD/PA/NP OP Progress Note  10/06/2020 9:09 AM Curtis Mcdonald  MRN:  [id number]  Chief Complaint: "I like my medicines" Chief Complaint    Follow-up    "  HPI: 64 year old male seen today for follow up psychiatric evaluation.  He has a psychiatric history of schizophrenia, anxiety, and cocaine abuse.  He is currently managed on Latuda 40 mg daily, Buspar 10 mg thee times daily, hydroxyzine 10 mg three times daily, and trazodone 100 mg at bedtime. Today he notes that his medications are effective in managing his psychiatric conditions.   Today he is pleasant, cooperative, engaged in conversation, and maintained eye contacting. He notes that since his last visit things have been going well and notes that he likes his medications. Provider conducted a GAD 7 and patient scored a 12, at last visit he scored a 7. He informed Probation officer that he often worries about if he is doing the best he can. He however reports that he is able to cope with his anxiety. Provider also conducted a PHQ 9 and patient scored a 1, at last visit he scored a 0. He informed Probation officer that his sleep is disturbed. He notes that he sleeps a total of 7 to 8 hours but wakes up throughout the night (noting he may sleep 2-3 hours at a time before he wakes. He notes that he is able to go back to sleep). Patient informed Probation officer that he continues to cope with the above by reading the bible and watching television. Patient also note that he has AH. He states that his voice urges him to stay on track he notes that he is okay with his voices and notes that he does not want his Latuda adjusted at this time.   He denies SI/HI/VH or paranoia.     No medication changes made today. He will continue all medications as prescribed.   No other concerns noted at this time.  Visit Diagnosis:    ICD-10-CM   1. Generalized anxiety disorder  F41.1 busPIRone (BUSPAR) 10 MG tablet    hydrOXYzine (ATARAX/VISTARIL) 10 MG tablet  2. Paranoid schizophrenia ([place])   F20.0 lurasidone (LATUDA) 40 MG TABS tablet    traZODone (DESYREL) 100 MG tablet    Past Psychiatric History:  schizophrenia, anxiety, and cocaine abuse  Past Medical History: No past medical history on file.  Past Surgical History:  Procedure Laterality Date  . WISDOM TOOTH EXTRACTION      Family Psychiatric History: Schizophrenia, anxiety, cocaine abuse  Family History:  Family History  Problem Relation Age of Onset  . CAD Neg Hx   . Diabetes Neg Hx     Social History:  Social History   Socioeconomic History  . Marital status: Single    Spouse name: Not on file  . Number of children: Not on file  . Years of education: Not on file  . Highest education level: Not on file  Occupational History  . Occupation: Atm assembly    Comment: [employer]  Tobacco Use  . Smoking status: Never Smoker  . Smokeless tobacco: Never Used  Substance and Sexual Activity  . Alcohol use: No    Comment: fomer   . Drug use: No  . Sexual activity: Not Currently  Other Topics Concern  . Not on file  Social History Narrative  . Not on file   Social Determinants of Health   Financial Resource Strain: Low Risk   . Difficulty of Paying Living Expenses: Not very  hard  Food Insecurity: No Food Insecurity  . Worried About Charity fundraiser in the Last Year: Never true  . Ran Out of Food in the Last Year: Never true  Transportation Needs: No Transportation Needs  . Lack of Transportation (Medical): No  . Lack of Transportation (Non-Medical): No  Physical Activity: Insufficiently Active  . Days of Exercise per Week: 3 days  . Minutes of Exercise per Session: 30 min  Stress: No Stress Concern Present  . Feeling of Stress : Only a little  Social Connections: Moderately Isolated  . Frequency of Communication with Friends and Family: Once a week  . Frequency of Social Gatherings with Friends and Family: Once a week  . Attends Religious Services: More than 4 times per year  . Active Member of  Clubs or Organizations: Yes  . Attends Archivist Meetings: More than 4 times per year  . Marital Status: Divorced    Allergies: No Known Allergies  Metabolic Disorder Labs: Lab Results  Component Value Date   HGBA1C 5.1 04/12/2016   MPG 100 04/12/2016   No results found for: PROLACTIN Lab Results  Component Value Date   CHOL 136 04/12/2016   TRIG 68 04/12/2016   HDL 38 (L) 04/12/2016   CHOLHDL 3.6 04/12/2016   VLDL 14 04/12/2016   LDLCALC 84 04/12/2016   LDLCALC 94 01/11/2010   Lab Results  Component Value Date   TSH 0.330 (L) 04/12/2016    Therapeutic Level Labs: No results found for: LITHIUM No results found for: VALPROATE No components found for:  CBMZ  Current Medications: Current Outpatient Medications  Medication Sig Dispense Refill  . busPIRone (BUSPAR) 10 MG tablet Take 1 tablet (10 mg total) by mouth 3 (three) times daily. 90 tablet 2  . hydrOXYzine (ATARAX/VISTARIL) 10 MG tablet Take 1 tablet (10 mg total) by mouth 3 (three) times daily as needed. 90 tablet 2  . lurasidone (LATUDA) 40 MG TABS tablet Take 1 tablet (40 mg total) by mouth daily with breakfast. 30 tablet 2  . traZODone (DESYREL) 100 MG tablet Take 1 tablet (100 mg total) by mouth at bedtime. 30 tablet 2   No current facility-administered medications for this visit.     Musculoskeletal: Strength & Muscle Tone: within normal limits Gait & Station: normal Patient leans: N/A  Psychiatric Specialty Exam: Review of Systems  Blood pressure 109/80, pulse 70, height 5' 9"  (1.753 m), weight 150 lb (68 kg), SpO2 99 %.Body mass index is 22.15 kg/m.  General Appearance: Well Groomed  Eye Contact:  Good  Speech:  Clear and Coherent and Normal Rate  Volume:  Normal  Mood:  Euthymic and informed provider that he has some anxiety but he is able to cope with it  Affect:  Congruent  Thought Process:  Coherent, Goal Directed and Linear  Orientation:  Full (Time, Place, and Person)  Thought  Content: WDL, Logical and Hallucinations: Auditory   Suicidal Thoughts:  No  Homicidal Thoughts:  No  Memory:  Immediate;   Good Recent;   Good Remote;   Good  Judgement:  Good  Insight:  Good  Psychomotor Activity:  Normal  Concentration:  Concentration: Good and Attention Span: Good  Recall:  Good  Fund of Knowledge: Good  Language: Good  Akathisia:  No  Handed:  Right  AIMS (if indicated): done  Assets:  Communication Skills Desire for Improvement Financial Resources/Insurance Housing  ADL's:  Intact  Cognition: WNL  Sleep:  Fair  Screenings: GAD-7   Flowsheet Row Clinical Support from 10/06/2020 in [facility] Counselor from 08/19/2020 in [facility] Counselor from 06/16/2020 in [facility]  Total GAD-7 Score 12 7 6     PHQ2-9   Flowsheet Row Clinical Support from 10/06/2020 in [facility] Counselor from 08/19/2020 in [facility] Counselor from 06/30/2020 in [facility] Counselor from 06/16/2020 in [facility] Counselor from 03/30/2020 in [facility]  PHQ-2 Total Score 0 0 2 0 2  PHQ-9 Total Score 1 0 5 7 4        Assessment and Plan: Patient reports that he has occasional anxiety but is able to cope with it. No medication changes made today. He will continue all medications as prescribed.  1. Generalized anxiety disorder  Continue- busPIRone (BUSPAR) 10 MG tablet; Take 1 tablet (10 mg total) by mouth 3 (three) times daily.  Dispense: 90 tablet; Refill: 2 Start- hydrOXYzine (ATARAX/VISTARIL) 10 MG tablet; Take 1 tablet (10 mg total) by mouth 3 (three) times daily as needed.  Dispense: 90 tablet; Refill: 2  2. Paranoid schizophrenia ([place])  Continue- lurasidone (LATUDA) 40 MG TABS tablet; Take 1 tablet (40 mg total) by mouth daily with breakfast.   Dispense: 30 tablet; Refill: 2 Continue- traZODone (DESYREL) 100 MG tablet; Take 1 tablet (100 mg total) by mouth at bedtime.  Dispense: 30 tablet; Refill: 2   Follow up in 2  month Follow up with threapy  Salley Slaughter, NP 10/06/2020, 9:09 AM

## 2020-11-22 ENCOUNTER — Ambulatory Visit (INDEPENDENT_AMBULATORY_CARE_PROVIDER_SITE_OTHER): Payer: No Payment, Other | Admitting: Psychiatry

## 2020-11-22 ENCOUNTER — Encounter (HOSPITAL_COMMUNITY): Payer: Self-pay | Admitting: Psychiatry

## 2020-11-22 ENCOUNTER — Other Ambulatory Visit: Payer: Self-pay

## 2020-11-22 DIAGNOSIS — F200 Paranoid schizophrenia: Secondary | ICD-10-CM

## 2020-11-22 DIAGNOSIS — F411 Generalized anxiety disorder: Secondary | ICD-10-CM

## 2020-11-22 MED ORDER — HYDROXYZINE HCL 10 MG PO TABS: 10.0000 mg | ORAL_TABLET | Freq: Three times a day (TID) | ORAL | 2 refills | Status: DC | PRN

## 2020-11-22 MED ORDER — BUSPIRONE HCL 10 MG PO TABS: 10.0000 mg | ORAL_TABLET | Freq: Three times a day (TID) | ORAL | 2 refills | Status: DC

## 2020-11-22 MED ORDER — LURASIDONE HCL 40 MG PO TABS: 40.0000 mg | ORAL_TABLET | Freq: Every day | ORAL | 2 refills | Status: DC

## 2020-11-22 MED ORDER — TRAZODONE HCL 100 MG PO TABS: 100.0000 mg | ORAL_TABLET | Freq: Every day | ORAL | 2 refills | Status: DC

## 2020-11-22 NOTE — Progress Notes (Signed)
[place] MD/PA/NP OP Progress Note  11/22/2020 11:44 AM Curtis Mcdonald  MRN:  [id number]  Chief Complaint: "Things are going okay" Chief Complaint    Medication Management    "  HPI: 65 year old male seen today for follow up psychiatric evaluation.  He has a psychiatric history of schizophrenia, anxiety, and cocaine abuse.  He is currently managed on Latuda 40 mg daily, Buspar 10 mg thee times daily, hydroxyzine 10 mg three times daily, and trazodone 100 mg at bedtime. Today he notes that his medications are effective in managing his psychiatric conditions.   Today he is pleasant, cooperative, engaged in conversation, and maintained eye contacting.  Today he informed provider that he is doing well.  He notes that he has very minimal anxiety and depression.  Today provider conducted a GAD-7 and patient scored a 2, at his last visit he ordered a 12 . Provider also conducted a PHQ 9 and patient scored a 3, at last visit he scored a 1.  Patient notes at times he worries about life and if bad things will happen.  He notes he copes with his worries by reading the Bible.  He notes that he recently finished reading Exodus 20 which he notes was about the 10 Commandments.  Patient notes that his sleep continues to be disturbed however he notes that he sleeps 7 to 8 hours nightly and notes that he feels rested.  Patient notes that he continues to have [place] which encourages him to stay on track.  He denies SI/HI/VH or paranoia.     No medication changes made today. He will continue all medications as prescribed.   No other concerns noted at this time.  Visit Diagnosis:    ICD-10-CM   1. Generalized anxiety disorder  F41.1 busPIRone (BUSPAR) 10 MG tablet    hydrOXYzine (ATARAX/VISTARIL) 10 MG tablet  2. Paranoid schizophrenia ([place])  F20.0 lurasidone (LATUDA) 40 MG TABS tablet    traZODone (DESYREL) 100 MG tablet    Past Psychiatric History:  schizophrenia, anxiety, and cocaine abuse  Past Medical History:  History reviewed. No pertinent past medical history.  Past Surgical History:  Procedure Laterality Date  . WISDOM TOOTH EXTRACTION      Family Psychiatric History: Schizophrenia, anxiety, cocaine abuse  Family History:  Family History  Problem Relation Age of Onset  . CAD Neg Hx   . Diabetes Neg Hx     Social History:  Social History   Socioeconomic History  . Marital status: Single    Spouse name: Not on file  . Number of children: Not on file  . Years of education: Not on file  . Highest education level: Not on file  Occupational History  . Occupation: Atm assembly    Comment: [employer]  Tobacco Use  . Smoking status: Never Smoker  . Smokeless tobacco: Never Used  Substance and Sexual Activity  . Alcohol use: No    Comment: fomer   . Drug use: No  . Sexual activity: Not Currently  Other Topics Concern  . Not on file  Social History Narrative  . Not on file   Social Determinants of Health   Financial Resource Strain: Low Risk   . Difficulty of Paying Living Expenses: Not very hard  Food Insecurity: No Food Insecurity  . Worried About Charity fundraiser in the Last Year: Never true  . Ran Out of Food in the Last Year: Never true  Transportation Needs: No Transportation Needs  .  Lack of Transportation (Medical): No  . Lack of Transportation (Non-Medical): No  Physical Activity: Insufficiently Active  . Days of Exercise per Week: 3 days  . Minutes of Exercise per Session: 30 min  Stress: No Stress Concern Present  . Feeling of Stress : Only a little  Social Connections: Moderately Isolated  . Frequency of Communication with Friends and Family: Once a week  . Frequency of Social Gatherings with Friends and Family: Once a week  . Attends Religious Services: More than 4 times per year  . Active Member of Clubs or Organizations: Yes  . Attends Archivist Meetings: More than 4 times per year  . Marital Status: Divorced    Allergies: No Known  Allergies  Metabolic Disorder Labs: Lab Results  Component Value Date   HGBA1C 5.1 04/12/2016   MPG 100 04/12/2016   No results found for: PROLACTIN Lab Results  Component Value Date   CHOL 136 04/12/2016   TRIG 68 04/12/2016   HDL 38 (L) 04/12/2016   CHOLHDL 3.6 04/12/2016   VLDL 14 04/12/2016   LDLCALC 84 04/12/2016   LDLCALC 94 01/11/2010   Lab Results  Component Value Date   TSH 0.330 (L) 04/12/2016    Therapeutic Level Labs: No results found for: LITHIUM No results found for: VALPROATE No components found for:  CBMZ  Current Medications: Current Outpatient Medications  Medication Sig Dispense Refill  . busPIRone (BUSPAR) 10 MG tablet Take 1 tablet (10 mg total) by mouth 3 (three) times daily. 90 tablet 2  . hydrOXYzine (ATARAX/VISTARIL) 10 MG tablet Take 1 tablet (10 mg total) by mouth 3 (three) times daily as needed. 90 tablet 2  . lurasidone (LATUDA) 40 MG TABS tablet Take 1 tablet (40 mg total) by mouth daily with breakfast. 30 tablet 2  . traZODone (DESYREL) 100 MG tablet Take 1 tablet (100 mg total) by mouth at bedtime. 30 tablet 2   No current facility-administered medications for this visit.     Musculoskeletal: Strength & Muscle Tone: within normal limits Gait & Station: normal Patient leans: N/A  Psychiatric Specialty Exam: Review of Systems  Blood pressure 120/69, pulse 73, height _0  (1.753 m), weight 155 lb (70.3 kg), SpO2 100 %.Body mass index is 22.89 kg/m.  General Appearance: Well Groomed  Eye Contact:  Good  Speech:  Clear and Coherent and Normal Rate  Volume:  Normal  Mood:  Euthymic and informed provider that he has some anxiety but he is able to cope with it  Affect:  Congruent  Thought Process:  Coherent, Goal Directed and Linear  Orientation:  Full (Time, Place, and Person)  Thought Content: WDL, Logical and Hallucinations: Auditory   Suicidal Thoughts:  No  Homicidal Thoughts:  No  Memory:  Immediate;   Good Recent;    Good Remote;   Good  Judgement:  Good  Insight:  Good  Psychomotor Activity:  Normal  Concentration:  Concentration: Good and Attention Span: Good  Recall:  Good  Fund of Knowledge: Good  Language: Good  Akathisia:  No  Handed:  Right  AIMS (if indicated): done  Assets:  Communication Skills Desire for Improvement Financial Resources/Insurance Housing  ADL's:  Intact  Cognition: WNL  Sleep:  Fair   Screenings: AIMS   Flowsheet Row Clinical Support from 10/06/2020 in [place] Total Score 0    GAD-7   [place] from 11/22/2020 in [place]  from 10/06/2020 in [facility] Counselor from 08/19/2020 in [facility] Counselor from 06/16/2020 in [facility]  Total GAD-7 Score _0 PHQ2-9   Flowsheet Row Clinical Support from 11/22/2020 in [place] from 10/06/2020 in [facility] Counselor from 08/19/2020 in [facility] Counselor from 06/30/2020 in [facility] Counselor from 06/16/2020 in [facility]  PHQ-2 Total Score 0 0 0 2 0  PHQ-9 Total Score 3 1 0 5 7    Flowsheet Row Clinical Support from 11/22/2020 in [place] No Risk       Assessment and Plan: Patient reports that he is doing well on his current medication patient changes made today.  Patient agreeable to continue medications as prescribed. 1. Generalized anxiety disorder  Continue- busPIRone (BUSPAR) 10 MG tablet; Take 1 tablet (10 mg total) by mouth 3 (three) times daily.  Dispense: 90 tablet; Refill: 2 Start- hydrOXYzine (ATARAX/VISTARIL) 10 MG tablet; Take 1 tablet (10 mg total) by mouth 3 (three) times daily as  needed.  Dispense: 90 tablet; Refill: 2  2. Paranoid schizophrenia ([place])  Continue- lurasidone (LATUDA) 40 MG TABS tablet; Take 1 tablet (40 mg total) by mouth daily with breakfast.  Dispense: 30 tablet; Refill: 2 Continue- traZODone (DESYREL) 100 MG tablet; Take 1 tablet (100 mg total) by mouth at bedtime.  Dispense: 30 tablet; Refill: 2   Follow up in 3  month Follow up with threapy  Salley Slaughter, NP 11/22/2020, 11:44 AM

## 2021-01-25 ENCOUNTER — Other Ambulatory Visit (HOSPITAL_COMMUNITY): Payer: Self-pay | Admitting: Psychiatry

## 2021-01-25 DIAGNOSIS — F200 Paranoid schizophrenia: Secondary | ICD-10-CM

## 2021-02-20 ENCOUNTER — Other Ambulatory Visit: Payer: Self-pay

## 2021-02-20 ENCOUNTER — Encounter (HOSPITAL_COMMUNITY): Payer: Self-pay | Admitting: Psychiatry

## 2021-02-20 ENCOUNTER — Ambulatory Visit (INDEPENDENT_AMBULATORY_CARE_PROVIDER_SITE_OTHER): Payer: No Payment, Other | Admitting: Psychiatry

## 2021-02-20 DIAGNOSIS — F411 Generalized anxiety disorder: Secondary | ICD-10-CM | POA: Diagnosis not present

## 2021-02-20 DIAGNOSIS — F200 Paranoid schizophrenia: Secondary | ICD-10-CM | POA: Diagnosis not present

## 2021-02-20 MED ORDER — BUSPIRONE HCL 10 MG PO TABS: 10.0000 mg | ORAL_TABLET | Freq: Three times a day (TID) | ORAL | 2 refills | Status: DC

## 2021-02-20 MED ORDER — HYDROXYZINE HCL 10 MG PO TABS: 10.0000 mg | ORAL_TABLET | Freq: Three times a day (TID) | ORAL | 2 refills | Status: DC | PRN

## 2021-02-20 MED ORDER — LURASIDONE HCL 40 MG PO TABS: 40.0000 mg | ORAL_TABLET | Freq: Every day | ORAL | 2 refills | Status: DC

## 2021-02-20 MED ORDER — TRAZODONE HCL 100 MG PO TABS
100.0000 mg | ORAL_TABLET | Freq: Every day | ORAL | 2 refills | Status: DC
Start: 2021-02-20 — End: 2021-05-22

## 2021-02-20 NOTE — Progress Notes (Signed)
[place] MD/PA/NP OP Progress Note  02/20/2021 1:16 PM Curtis Mcdonald  MRN:  [id number]  Chief Complaint: "Sometimes I worry about dieing" Chief Complaint    Medication Management    "  HPI: 65 year old male seen today for follow up psychiatric evaluation.  He has a psychiatric history of schizophrenia, anxiety, and cocaine abuse.  He is currently managed on Latuda 40 mg daily, Buspar 10 mg thee times daily, hydroxyzine 10 mg three times daily, and trazodone 100 mg at bedtime. Today he notes that his medications are effective in managing his psychiatric conditions.   Today he is pleasant, cooperative, engaged in conversation, and maintained eye contacting. He informed provider tha overall he is doing well however notes that at times he becomes anxious and worries about dying.  He notes he is getting older and thinks about death more often.  Provider asked patient if he was interested in speaking to a therapist and he notes that at this time he is not.  Today provider conducted a GAD-7 and patient scored an 11, at his last visit he scored a 2.  Provider also conducted a PHQ-9 and patient scored a 2, at his last visit he scored a 3.  Today he endorses adequate sleep and appetite.  He denies SI/HI/VAH, mania, or paranoia.     Patient informed provider that he is able to cope with his anxiety and request that his medications not be adjusted.  No medication changes made today. He will continue all medications as prescribed.   No other concerns noted at this time.  Visit Diagnosis:    ICD-10-CM   1. Generalized anxiety disorder  F41.1 busPIRone (BUSPAR) 10 MG tablet    hydrOXYzine (ATARAX/VISTARIL) 10 MG tablet  2. Paranoid schizophrenia ([place])  F20.0 lurasidone (LATUDA) 40 MG TABS tablet    traZODone (DESYREL) 100 MG tablet    Past Psychiatric History:  schizophrenia, anxiety, and cocaine abuse  Past Medical History: History reviewed. No pertinent past medical history.  Past Surgical History:   Procedure Laterality Date  . WISDOM TOOTH EXTRACTION      Family Psychiatric History: Schizophrenia, anxiety, cocaine abuse  Family History:  Family History  Problem Relation Age of Onset  . CAD Neg Hx   . Diabetes Neg Hx     Social History:  Social History   Socioeconomic History  . Marital status: Single    Spouse name: Not on file  . Number of children: Not on file  . Years of education: Not on file  . Highest education level: Not on file  Occupational History  . Occupation: Atm assembly    Comment: [employer]  Tobacco Use  . Smoking status: Never Smoker  . Smokeless tobacco: Never Used  Substance and Sexual Activity  . Alcohol use: No    Comment: fomer   . Drug use: No  . Sexual activity: Not Currently  Other Topics Concern  . Not on file  Social History Narrative  . Not on file   Social Determinants of Health   Financial Resource Strain: Low Risk   . Difficulty of Paying Living Expenses: Not very hard  Food Insecurity: No Food Insecurity  . Worried About Charity fundraiser in the Last Year: Never true  . Ran Out of Food in the Last Year: Never true  Transportation Needs: No Transportation Needs  . Lack of Transportation (Medical): No  . Lack of Transportation (Non-Medical): No  Physical Activity: Insufficiently Active  . Days of  Exercise per Week: 3 days  . Minutes of Exercise per Session: 30 min  Stress: No Stress Concern Present  . Feeling of Stress : Only a little  Social Connections: Moderately Isolated  . Frequency of Communication with Friends and Family: Once a week  . Frequency of Social Gatherings with Friends and Family: Once a week  . Attends Religious Services: More than 4 times per year  . Active Member of Clubs or Organizations: Yes  . Attends Archivist Meetings: More than 4 times per year  . Marital Status: Divorced    Allergies: No Known Allergies  Metabolic Disorder Labs: Lab Results  Component Value Date   HGBA1C 5.1  04/12/2016   MPG 100 04/12/2016   No results found for: PROLACTIN Lab Results  Component Value Date   CHOL 136 04/12/2016   TRIG 68 04/12/2016   HDL 38 (L) 04/12/2016   CHOLHDL 3.6 04/12/2016   VLDL 14 04/12/2016   LDLCALC 84 04/12/2016   LDLCALC 94 01/11/2010   Lab Results  Component Value Date   TSH 0.330 (L) 04/12/2016    Therapeutic Level Labs: No results found for: LITHIUM No results found for: VALPROATE No components found for:  CBMZ  Current Medications: Current Outpatient Medications  Medication Sig Dispense Refill  . busPIRone (BUSPAR) 10 MG tablet Take 1 tablet (10 mg total) by mouth 3 (three) times daily. 90 tablet 2  . hydrOXYzine (ATARAX/VISTARIL) 10 MG tablet Take 1 tablet (10 mg total) by mouth 3 (three) times daily as needed. 90 tablet 2  . lurasidone (LATUDA) 40 MG TABS tablet Take 1 tablet (40 mg total) by mouth daily with breakfast. 30 tablet 2  . traZODone (DESYREL) 100 MG tablet Take 1 tablet (100 mg total) by mouth at bedtime. 30 tablet 2   No current facility-administered medications for this visit.     Musculoskeletal: Strength & Muscle Tone: within normal limits Gait & Station: normal Patient leans: N/A  Psychiatric Specialty Exam: Review of Systems  Blood pressure 95/64, pulse 76, height 5' 9"  (1.753 m), weight 155 lb (70.3 kg), SpO2 99 %.Body mass index is 22.89 kg/m.  General Appearance: Well Groomed  Eye Contact:  Good  Speech:  Clear and Coherent and Normal Rate  Volume:  Normal  Mood:  Euthymic and informed provider that he has some anxiety but he is able to cope with it  Affect:  Congruent  Thought Process:  Coherent, Goal Directed and Linear  Orientation:  Full (Time, Place, and Person)  Thought Content: WDL and Logical   Suicidal Thoughts:  No  Homicidal Thoughts:  No  Memory:  Immediate;   Good Recent;   Good Remote;   Good  Judgement:  Good  Insight:  Good  Psychomotor Activity:  Normal  Concentration:  Concentration:  Good and Attention Span: Good  Recall:  Good  Fund of Knowledge: Good  Language: Good  Akathisia:  No  Handed:  Right  AIMS (if indicated): done  Assets:  Communication Skills Desire for Improvement Financial Resources/Insurance Housing  ADL's:  Intact  Cognition: WNL  Sleep:  Good   Screenings: AIMS   Flowsheet Row Clinical Support from 10/06/2020 in [place] Total Score 0    GAD-7   [place] from 02/20/2021 in [place] from 11/22/2020 in [place] from 10/06/2020 in [facility] Counselor from 08/19/2020 in [place]  [facility] Counselor from 06/16/2020 in [facility]  Total GAD-7 Score 11 2 12 7 6     PHQ2-9   Flowsheet Row Clinical Support from 02/20/2021 in [place] from 11/22/2020 in [place] from 10/06/2020 in [facility] Counselor from 08/19/2020 in [facility] Counselor from 06/30/2020 in [facility]  PHQ-2 Total Score 0 0 0 0 2  PHQ-9 Total Score 2 3 1  0 5    Flowsheet Row Clinical Support from 02/20/2021 in [place] from 11/22/2020 in [place] No Risk No Risk       Assessment and Plan: Patient informed provider that he has been more anxious lately because he has been thinking about dying.  He however notes that he is able to cope with his anxiety and request that his medications not be adjusted at this time.  No medication changes made today.  Patient agreeable to continue medication as prescribed.  1. Generalized anxiety disorder  Continue- busPIRone (BUSPAR) 10  MG tablet; Take 1 tablet (10 mg total) by mouth 3 (three) times daily.  Dispense: 90 tablet; Refill: 2 Start- hydrOXYzine (ATARAX/VISTARIL) 10 MG tablet; Take 1 tablet (10 mg total) by mouth 3 (three) times daily as needed.  Dispense: 90 tablet; Refill: 2  2. Paranoid schizophrenia ([place])  Continue- lurasidone (LATUDA) 40 MG TABS tablet; Take 1 tablet (40 mg total) by mouth daily with breakfast.  Dispense: 30 tablet; Refill: 2 Continue- traZODone (DESYREL) 100 MG tablet; Take 1 tablet (100 mg total) by mouth at bedtime.  Dispense: 30 tablet; Refill: 2   Follow up in 3  month Follow up with [place], NP 02/20/2021, 1:16 PM

## 2021-05-22 ENCOUNTER — Encounter (HOSPITAL_COMMUNITY): Payer: Self-pay | Admitting: Psychiatry

## 2021-05-22 ENCOUNTER — Other Ambulatory Visit: Payer: Self-pay

## 2021-05-22 ENCOUNTER — Ambulatory Visit (INDEPENDENT_AMBULATORY_CARE_PROVIDER_SITE_OTHER): Payer: No Payment, Other | Admitting: Psychiatry

## 2021-05-22 DIAGNOSIS — F200 Paranoid schizophrenia: Secondary | ICD-10-CM

## 2021-05-22 DIAGNOSIS — F411 Generalized anxiety disorder: Secondary | ICD-10-CM

## 2021-05-22 MED ORDER — HYDROXYZINE HCL 10 MG PO TABS: 10.0000 mg | ORAL_TABLET | Freq: Three times a day (TID) | ORAL | 2 refills | Status: DC | PRN

## 2021-05-22 MED ORDER — LURASIDONE HCL 40 MG PO TABS: 40.0000 mg | ORAL_TABLET | Freq: Every day | ORAL | 2 refills | Status: DC

## 2021-05-22 MED ORDER — BUSPIRONE HCL 10 MG PO TABS: 10.0000 mg | ORAL_TABLET | Freq: Three times a day (TID) | ORAL | 2 refills | Status: DC

## 2021-05-22 MED ORDER — TRAZODONE HCL 100 MG PO TABS: 100.0000 mg | ORAL_TABLET | Freq: Every day | ORAL | 2 refills | Status: DC

## 2021-05-22 NOTE — Progress Notes (Signed)
BH MD/PA/NP OP Progress Note  05/22/2021 11:14 AM Curtis Mcdonald  MRN:  [id number]  Chief Complaint: "Im okay"   HPI: 65 year old male seen today for follow up psychiatric evaluation.  He has a psychiatric history of schizophrenia, anxiety, and cocaine abuse.  He is currently managed on Latuda 40 mg daily, Buspar 10 mg thee times daily, hydroxyzine 10 mg three times daily, and trazodone 100 mg at bedtime. Today he notes that his medications are effective in managing his psychiatric conditions.    Today he is pleasant, cooperative, engaged in conversation, and maintained eye contacting. He informed provider that he has been doing well since his last visit.  He notes that he spends his days watching TV and reading his Bible.  He informed Probation officer that he has minimal anxiety and depression.  Provider conducted a GAD-7 and patient scored a 4, at his last visit he scored an 11.  He notes at times that he worries about something happening to him and his family however reports that he is able to cope with it.  Provider also conducted a PHQ-9 and patient scored a 0, at his last visit he scored a 2.  He endorses adequate sleep and appetite.  Today he denies SI/HI/VAH, mania, or paranoia.    No medication changes made today. He will continue all medications as prescribed.   No other concerns noted at this time.  Visit Diagnosis:    ICD-10-CM   1. Generalized anxiety disorder  F41.1 busPIRone (BUSPAR) 10 MG tablet    hydrOXYzine (ATARAX/VISTARIL) 10 MG tablet    2. Paranoid schizophrenia ([place])  F20.0 lurasidone (LATUDA) 40 MG TABS tablet    traZODone (DESYREL) 100 MG tablet      Past Psychiatric History:  schizophrenia, anxiety, and cocaine abuse  Past Medical History: History reviewed. No pertinent past medical history.  Past Surgical History:  Procedure Laterality Date   WISDOM TOOTH EXTRACTION      Family Psychiatric History: Schizophrenia, anxiety, cocaine abuse  Family History:  Family  History  Problem Relation Age of Onset   CAD Neg Hx    Diabetes Neg Hx     Social History:  Social History   Socioeconomic History   Marital status: Single    Spouse name: Not on file   Number of children: Not on file   Years of education: Not on file   Highest education level: Not on file  Occupational History   Occupation: Atm assembly    Comment: [employer]  Tobacco Use   Smoking status: Never   Smokeless tobacco: Never  Substance and Sexual Activity   Alcohol use: No    Comment: fomer    Drug use: No   Sexual activity: Not Currently  Other Topics Concern   Not on file  Social History Narrative   Not on file   Social Determinants of Health   Financial Resource Strain: Not on file  Food Insecurity: Not on file  Transportation Needs: Not on file  Physical Activity: Insufficiently Active   Days of Exercise per Week: 3 days   Minutes of Exercise per Session: 30 min  Stress: No Stress Concern Present   Feeling of Stress : Only a little  Social Connections: Moderately Isolated   Frequency of Communication with Friends and Family: Once a week   Frequency of Social Gatherings with Friends and Family: Once a week   Attends Religious Services: More than 4 times per year   Active Member of [employer]  or Organizations: Yes   Attends Archivist Meetings: More than 4 times per year   Marital Status: Divorced    Allergies: No Known Allergies  Metabolic Disorder Labs: Lab Results  Component Value Date   HGBA1C 5.1 04/12/2016   MPG 100 04/12/2016   No results found for: PROLACTIN Lab Results  Component Value Date   CHOL 136 04/12/2016   TRIG 68 04/12/2016   HDL 38 (L) 04/12/2016   CHOLHDL 3.6 04/12/2016   VLDL 14 04/12/2016   LDLCALC 84 04/12/2016   LDLCALC 94 01/11/2010   Lab Results  Component Value Date   TSH 0.330 (L) 04/12/2016    Therapeutic Level Labs: No results found for: LITHIUM No results found for: VALPROATE No components found for:   CBMZ  Current Medications: Current Outpatient Medications  Medication Sig Dispense Refill   busPIRone (BUSPAR) 10 MG tablet Take 1 tablet (10 mg total) by mouth 3 (three) times daily. 90 tablet 2   hydrOXYzine (ATARAX/VISTARIL) 10 MG tablet Take 1 tablet (10 mg total) by mouth 3 (three) times daily as needed. 90 tablet 2   lurasidone (LATUDA) 40 MG TABS tablet Take 1 tablet (40 mg total) by mouth daily with breakfast. 30 tablet 2   traZODone (DESYREL) 100 MG tablet Take 1 tablet (100 mg total) by mouth at bedtime. 30 tablet 2   No current facility-administered medications for this visit.     Musculoskeletal: Strength & Muscle Tone: within normal limits Gait & Station: normal Patient leans: N/A  Psychiatric Specialty Exam: Review of Systems  There were no vitals taken for this visit.There is no height or weight on file to calculate BMI.  General Appearance: Well Groomed  Eye Contact:  Good  Speech:  Clear and Coherent and Normal Rate  Volume:  Normal  Mood:  Euthymic  Affect:  Congruent  Thought Process:  Coherent, Goal Directed and Linear  Orientation:  Full (Time, Place, and Person)  Thought Content: WDL and Logical   Suicidal Thoughts:  No  Homicidal Thoughts:  No  Memory:  Immediate;   Good Recent;   Good Remote;   Good  Judgement:  Good  Insight:  Good  Psychomotor Activity:  Normal  Concentration:  Concentration: Good and Attention Span: Good  Recall:  Good  Fund of Knowledge: Good  Language: Good  Akathisia:  No  Handed:  Right  AIMS (if indicated): done  Assets:  Communication Skills Desire for Improvement Financial Resources/Insurance Housing  ADL's:  Intact  Cognition: WNL  Sleep:  Good   Screenings: AIMS    Flowsheet Row Clinical Support from 10/06/2020 in [place] Total Score 0      GAD-7    [place] from 05/22/2021 in [place] from  02/20/2021 in [place] from 11/22/2020 in [place] from 10/06/2020 in [facility] Counselor from 08/19/2020 in [facility]  Total GAD-7 Score 4 11 2 12 7       PHQ2-9    [place] from 05/22/2021 in [place] from 02/20/2021 in [place] from 11/22/2020 in [place] from 10/06/2020 in [facility] Counselor from 08/19/2020 in [facility]  PHQ-2 Total Score 0 0 0 0 0  PHQ-9 Total Score 0 2 3 1  0      Flowsheet Row Clinical Support from 02/20/2021 in [place] from 11/22/2020 in [place] No Risk No Risk        Assessment and Plan: Patient notes that he is doing well on his current medication regimen.  No medication changes made today.  Patient agreeable to continue medication as prescribed.  1. Generalized anxiety disorder  Continue- busPIRone (BUSPAR) 10 MG tablet; Take 1 tablet (10 mg total) by mouth 3 (three) times daily.  Dispense: 90 tablet; Refill: 2 Start- hydrOXYzine (ATARAX/VISTARIL) 10 MG tablet; Take 1 tablet (10 mg total) by mouth 3 (three) times daily as needed.  Dispense: 90 tablet; Refill: 2  2. Paranoid schizophrenia ([place])  Continue- lurasidone (LATUDA) 40 MG TABS tablet; Take 1 tablet (40 mg total) by mouth daily with breakfast.  Dispense: 30 tablet; Refill: 2 Continue- traZODone (DESYREL) 100 MG tablet; Take 1 tablet (100 mg total) by mouth at bedtime.  Dispense: 30 tablet; Refill: 2   Follow up in 3  month Follow up with threapy  Salley Slaughter, NP 05/22/2021, 11:14 AM

## 2021-08-23 ENCOUNTER — Other Ambulatory Visit: Payer: Self-pay

## 2021-08-23 ENCOUNTER — Ambulatory Visit (INDEPENDENT_AMBULATORY_CARE_PROVIDER_SITE_OTHER): Payer: No Payment, Other | Admitting: Psychiatry

## 2021-08-23 ENCOUNTER — Encounter (HOSPITAL_COMMUNITY): Payer: Self-pay | Admitting: Psychiatry

## 2021-08-23 DIAGNOSIS — F411 Generalized anxiety disorder: Secondary | ICD-10-CM | POA: Diagnosis not present

## 2021-08-23 DIAGNOSIS — F200 Paranoid schizophrenia: Secondary | ICD-10-CM | POA: Diagnosis not present

## 2021-08-23 MED ORDER — BUSPIRONE HCL 10 MG PO TABS: 10.0000 mg | ORAL_TABLET | Freq: Three times a day (TID) | ORAL | 3 refills | Status: DC

## 2021-08-23 MED ORDER — TRAZODONE HCL 100 MG PO TABS: 100.0000 mg | ORAL_TABLET | Freq: Every day | ORAL | 3 refills | Status: DC

## 2021-08-23 MED ORDER — HYDROXYZINE HCL 10 MG PO TABS: 10.0000 mg | ORAL_TABLET | Freq: Three times a day (TID) | ORAL | 3 refills | Status: DC | PRN

## 2021-08-23 MED ORDER — LURASIDONE HCL 40 MG PO TABS: 40.0000 mg | ORAL_TABLET | Freq: Every day | ORAL | 3 refills | Status: DC

## 2021-08-23 NOTE — Progress Notes (Signed)
BH MD/PA/NP OP Progress Note  05/22/2021 11:14 AM Curtis Mcdonald  MRN:  [id number]  Chief Complaint: "Im same old same old"   HPI: 65 year old male seen today for follow up psychiatric evaluation.  He has a psychiatric history of schizophrenia, anxiety, and cocaine abuse.  He is currently managed on Latuda 40 mg daily, Buspar 10 mg thee times daily, hydroxyzine 10 mg three times daily, and trazodone 100 mg at bedtime. Today he notes that his medications are effective in managing his psychiatric conditions.    Today he is pleasant, cooperative, engaged in conversation, and maintained eye contacting. He informed provider that nothing has changed during that he is same old same old.  He notes he is looking forward to the holiday as he will be traveling to [facility] to spend time with his family.  He informed Probation officer that his mood is stable and reports he has minimal anxiety and depression.  Provider conducted a GAD-7 and patient scored a 1, at his last visit he scored a 4.  Provider also conducted a PHQ-9 and patient scored a 1, at his last visit he scored a 0.  He endorses adequate sleep and appetite. Today he denies SI/HI/VAH, mania, or paranoia.    No medication changes made today. He will continue all medications as prescribed.   No other concerns noted at this time.  Visit Diagnosis:    ICD-10-CM   1. Generalized anxiety disorder  F41.1 busPIRone (BUSPAR) 10 MG tablet    hydrOXYzine (ATARAX/VISTARIL) 10 MG tablet    2. Paranoid schizophrenia ([place])  F20.0 lurasidone (LATUDA) 40 MG TABS tablet    traZODone (DESYREL) 100 MG tablet      Past Psychiatric History:  schizophrenia, anxiety, and cocaine abuse  Past Medical History: History reviewed. No pertinent past medical history.  Past Surgical History:  Procedure Laterality Date   WISDOM TOOTH EXTRACTION      Family Psychiatric History: Schizophrenia, anxiety, cocaine abuse  Family History:  Family History  Problem Relation  Age of Onset   CAD Neg Hx    Diabetes Neg Hx     Social History:  Social History   Socioeconomic History   Marital status: Single    Spouse name: Not on file   Number of children: Not on file   Years of education: Not on file   Highest education level: Not on file  Occupational History   Occupation: Atm assembly    Comment: [employer]  Tobacco Use   Smoking status: Never   Smokeless tobacco: Never  Substance and Sexual Activity   Alcohol use: No    Comment: fomer    Drug use: No   Sexual activity: Not Currently  Other Topics Concern   Not on file  Social History Narrative   Not on file   Social Determinants of Health   Financial Resource Strain: Not on file  Food Insecurity: Not on file  Transportation Needs: Not on file  Physical Activity: Insufficiently Active   Days of Exercise per Week: 3 days   Minutes of Exercise per Session: 30 min  Stress: No Stress Concern Present   Feeling of Stress : Only a little  Social Connections: Moderately Isolated   Frequency of Communication with Friends and Family: Once a week   Frequency of Social Gatherings with Friends and Family: Once a week   Attends Religious Services: More than 4 times per year   Active Member of [employer] or Organizations: Yes  Attends Archivist Meetings: More than 4 times per year   Marital Status: Divorced    Allergies: No Known Allergies  Metabolic Disorder Labs: Lab Results  Component Value Date   HGBA1C 5.1 04/12/2016   MPG 100 04/12/2016   No results found for: PROLACTIN Lab Results  Component Value Date   CHOL 136 04/12/2016   TRIG 68 04/12/2016   HDL 38 (L) 04/12/2016   CHOLHDL 3.6 04/12/2016   VLDL 14 04/12/2016   LDLCALC 84 04/12/2016   LDLCALC 94 01/11/2010   Lab Results  Component Value Date   TSH 0.330 (L) 04/12/2016    Therapeutic Level Labs: No results found for: LITHIUM No results found for: VALPROATE No components found for:  CBMZ  Current  Medications: Current Outpatient Medications  Medication Sig Dispense Refill   busPIRone (BUSPAR) 10 MG tablet Take 1 tablet (10 mg total) by mouth 3 (three) times daily. 90 tablet 2   hydrOXYzine (ATARAX/VISTARIL) 10 MG tablet Take 1 tablet (10 mg total) by mouth 3 (three) times daily as needed. 90 tablet 2   lurasidone (LATUDA) 40 MG TABS tablet Take 1 tablet (40 mg total) by mouth daily with breakfast. 30 tablet 2   traZODone (DESYREL) 100 MG tablet Take 1 tablet (100 mg total) by mouth at bedtime. 30 tablet 2   No current facility-administered medications for this visit.     Musculoskeletal: Strength & Muscle Tone: within normal limits Gait & Station: normal Patient leans: N/A  Psychiatric Specialty Exam: Review of Systems  There were no vitals taken for this visit.There is no height or weight on file to calculate BMI.  General Appearance: Well Groomed  Eye Contact:  Good  Speech:  Clear and Coherent and Normal Rate  Volume:  Normal  Mood:  Euthymic  Affect:  Congruent  Thought Process:  Coherent, Goal Directed and Linear  Orientation:  Full (Time, Place, and Person)  Thought Content: WDL and Logical   Suicidal Thoughts:  No  Homicidal Thoughts:  No  Memory:  Immediate;   Good Recent;   Good Remote;   Good  Judgement:  Good  Insight:  Good  Psychomotor Activity:  Normal  Concentration:  Concentration: Good and Attention Span: Good  Recall:  Good  Fund of Knowledge: Good  Language: Good  Akathisia:  No  Handed:  Right  AIMS (if indicated): done  Assets:  Communication Skills Desire for Improvement Financial Resources/Insurance Housing  ADL's:  Intact  Cognition: WNL  Sleep:  Good   Screenings: AIMS    Flowsheet Row Clinical Support from 10/06/2020 in [place] Total Score 0      GAD-7    [place] from 05/22/2021 in [place] from 02/20/2021 in  [place] from 11/22/2020 in [place] from 10/06/2020 in [facility] Counselor from 08/19/2020 in [facility]  Total GAD-7 Score 4 11 2 12 7       PHQ2-9    [place] from 05/22/2021 in [place] from 02/20/2021 in [place] from 11/22/2020 in [place] from 10/06/2020 in [facility] Counselor from 08/19/2020 in [facility]  PHQ-2 Total Score 0 0 0 0 0  PHQ-9 Total Score 0 2  3 1 0      Flowsheet Row Clinical Support from 02/20/2021 in [place] from 11/22/2020 in [place] No Risk No Risk        Assessment and Plan: Patient notes that he is doing well on his current medication regimen.  No medication changes made today.  Patient agreeable to continue medication as prescribed.  1. Generalized anxiety disorder  Continue- busPIRone (BUSPAR) 10 MG tablet; Take 1 tablet (10 mg total) by mouth 3 (three) times daily.  Dispense: 90 tablet; Refill: 3 Start- hydrOXYzine (ATARAX/VISTARIL) 10 MG tablet; Take 1 tablet (10 mg total) by mouth 3 (three) times daily as needed.  Dispense: 90 tablet; Refill: 3  2. Paranoid schizophrenia ([place])  Continue- lurasidone (LATUDA) 40 MG TABS tablet; Take 1 tablet (40 mg total) by mouth daily with breakfast.  Dispense: 30 tablet; Refill: 3 Continue- traZODone (DESYREL) 100 MG tablet; Take 1 tablet (100 mg total) by mouth at bedtime.  Dispense: 30 tablet; Refill: 3   Follow up in 3  month Follow up with threapy  Salley Slaughter, NP 05/22/2021, 11:14 AM

## 2021-11-16 ENCOUNTER — Encounter (HOSPITAL_COMMUNITY): Payer: Self-pay

## 2021-11-16 ENCOUNTER — Telehealth (HOSPITAL_COMMUNITY): Payer: No Payment, Other | Admitting: Psychiatry

## 2021-11-16 ENCOUNTER — Other Ambulatory Visit (HOSPITAL_COMMUNITY): Payer: Self-pay | Admitting: Psychiatry

## 2021-11-16 ENCOUNTER — Telehealth (HOSPITAL_COMMUNITY): Payer: Self-pay | Admitting: Psychiatry

## 2021-11-16 DIAGNOSIS — F200 Paranoid schizophrenia: Secondary | ICD-10-CM

## 2021-11-16 DIAGNOSIS — F411 Generalized anxiety disorder: Secondary | ICD-10-CM

## 2021-11-16 MED ORDER — TRAZODONE HCL 100 MG PO TABS: 100.0000 mg | ORAL_TABLET | Freq: Every day | ORAL | 3 refills | Status: DC

## 2021-11-16 MED ORDER — LURASIDONE HCL 40 MG PO TABS: 40.0000 mg | ORAL_TABLET | Freq: Every day | ORAL | 3 refills | Status: DC

## 2021-11-16 MED ORDER — HYDROXYZINE HCL 10 MG PO TABS: 10.0000 mg | ORAL_TABLET | Freq: Three times a day (TID) | ORAL | 3 refills | Status: DC | PRN

## 2021-11-16 MED ORDER — BUSPIRONE HCL 10 MG PO TABS: 10.0000 mg | ORAL_TABLET | Freq: Three times a day (TID) | ORAL | 3 refills | Status: DC

## 2021-11-16 NOTE — Telephone Encounter (Signed)
Medication refilled and sent to preferred pharmacy

## 2021-11-16 NOTE — Telephone Encounter (Signed)
Patient contacted the office to reschedule appt. Missed to due to phone number changed. Please refill meds.

## 2022-01-11 ENCOUNTER — Encounter (HOSPITAL_COMMUNITY): Payer: Self-pay

## 2022-01-11 ENCOUNTER — Encounter (HOSPITAL_COMMUNITY): Payer: No Payment, Other | Admitting: Psychiatry

## 2022-02-01 ENCOUNTER — Ambulatory Visit (INDEPENDENT_AMBULATORY_CARE_PROVIDER_SITE_OTHER): Payer: No Payment, Other | Admitting: Physician Assistant

## 2022-02-01 ENCOUNTER — Encounter (HOSPITAL_COMMUNITY): Payer: Self-pay | Admitting: Physician Assistant

## 2022-02-01 DIAGNOSIS — F411 Generalized anxiety disorder: Secondary | ICD-10-CM | POA: Diagnosis not present

## 2022-02-01 DIAGNOSIS — F200 Paranoid schizophrenia: Secondary | ICD-10-CM

## 2022-02-01 MED ORDER — TRAZODONE HCL 150 MG PO TABS: 150.0000 mg | ORAL_TABLET | Freq: Every day | ORAL | 3 refills | Status: DC

## 2022-02-01 MED ORDER — LURASIDONE HCL 40 MG PO TABS: 40.0000 mg | ORAL_TABLET | Freq: Every day | ORAL | 3 refills | Status: DC

## 2022-02-01 MED ORDER — HYDROXYZINE HCL 25 MG PO TABS: 25.0000 mg | ORAL_TABLET | Freq: Three times a day (TID) | ORAL | 2 refills | Status: DC | PRN

## 2022-02-01 MED ORDER — BUSPIRONE HCL 10 MG PO TABS: 10.0000 mg | ORAL_TABLET | Freq: Three times a day (TID) | ORAL | 3 refills | Status: DC

## 2022-02-01 NOTE — Progress Notes (Signed)
BH MD/PA/NP OP Progress Note ? ?02/01/2022 2:43 PM ?Curtis Mcdonald  ?MRN:  [id number] ? ?Chief Complaint: No chief complaint on file. ? ?HPI:  ? ?Curtis Mcdonald is a 66 year old male with a past psychiatric history significant for paranoid schizophrenia and generalized anxiety disorder who presents to [facility] for follow-up and medication management.  Patient is currently being managed on the following medications: ? ?Latuda 40 mg daily with breakfast ?Trazodone 100 mg at bedtime ?Hydroxyzine 10 mg 3 times daily as needed ?Buspirone 10 mg 3 times daily ? ?Patient reports that he has been having issues with sleeping.  Patient reports that his issues with sleep have been going on for a month and a half.  Patient denies experiencing any new life events or major life changes that could be contributing to his sleep.  Patient denies any mood swings.  He further denies depressive symptoms.  Patient does endorse anxiety and rates his anxiety an 8 out of 10.  Patient attributes his anxiety to not attending church regularly or reading his Bible enough.  A GAD-7 screen was performed with the patient scoring a 6. ? ?Patient is alert and oriented x4, pleasant, calm, cooperative, and fully engaged in conversation during the encounter.  Patient endorses good mood.  Patient denies suicidal or homicidal ideations.  He further denies auditory or visual hallucinations and does not appear to be responding to internal/external stimuli.  Patient endorses fair sleep and receives on average 4 to 5 hours of sleep each night characterized by waking up constantly.  Patient endorses good appetite and eats on average 3 meals per day.  Patient denies alcohol consumption, tobacco use, and illicit drug use. ? ?Visit Diagnosis:  ?  ICD-10-CM   ?1. Paranoid schizophrenia ([place])  F20.0 lurasidone (LATUDA) 40 MG TABS tablet  ?  traZODone (DESYREL) 150 MG tablet  ?  ?2. Generalized anxiety disorder  F41.1  hydrOXYzine (ATARAX) 25 MG tablet  ?  busPIRone (BUSPAR) 10 MG tablet  ?  ? ? ?Past Psychiatric History:  ?Schizophrenia ?Generalized anxiety disorder ?Cocaine abuse ? ?Past Medical History: History reviewed. No pertinent past medical history.  ?Past Surgical History:  ?Procedure Laterality Date  ? WISDOM TOOTH EXTRACTION    ? ? ?Family Psychiatric History:  ?Patient has a family history of psychiatric illness significant for schizophrenia, anxiety, and cocaine abuse ? ?Family History:  ?Family History  ?Problem Relation Age of Onset  ? CAD Neg Hx   ? Diabetes Neg Hx   ? ? ?Social History:  ?Social History  ? ?Socioeconomic History  ? Marital status: Single  ?  Spouse name: Not on file  ? Number of children: Not on file  ? Years of education: Not on file  ? Highest education level: Not on file  ?Occupational History  ? Occupation: Atm assembly  ?  Comment: [employer]  ?Tobacco Use  ? Smoking status: Never  ? Smokeless tobacco: Never  ?Substance and Sexual Activity  ? Alcohol use: No  ?  Comment: fomer   ? Drug use: No  ? Sexual activity: Not Currently  ?Other Topics Concern  ? Not on file  ?Social History Narrative  ? Not on file  ? ?Social Determinants of Health  ? ?Financial Resource Strain: Not on file  ?Food Insecurity: Not on file  ?Transportation Needs: Not on file  ?Physical Activity: Not on file  ?Stress: Not on file  ?Social Connections: Not on file  ? ? ?Allergies: No  Known Allergies ? ?Metabolic Disorder Labs: ?Lab Results  ?Component Value Date  ? HGBA1C 5.1 04/12/2016  ? MPG 100 04/12/2016  ? ?No results found for: PROLACTIN ?Lab Results  ?Component Value Date  ? CHOL 136 04/12/2016  ? TRIG 68 04/12/2016  ? HDL 38 (L) 04/12/2016  ? CHOLHDL 3.6 04/12/2016  ? VLDL 14 04/12/2016  ? [place] 84 04/12/2016  ? [place] 94 01/11/2010  ? ?Lab Results  ?Component Value Date  ? TSH 0.330 (L) 04/12/2016  ? ? ?Therapeutic Level Labs: ?No results found for: LITHIUM ?No results found for: VALPROATE ?No components found  for:  CBMZ ? ?Current Medications: ?Current Outpatient Medications  ?Medication Sig Dispense Refill  ? busPIRone (BUSPAR) 10 MG tablet Take 1 tablet (10 mg total) by mouth 3 (three) times daily. 90 tablet 3  ? hydrOXYzine (ATARAX) 25 MG tablet Take 1 tablet (25 mg total) by mouth 3 (three) times daily as needed for anxiety. 75 tablet 2  ? lurasidone (LATUDA) 40 MG TABS tablet Take 1 tablet (40 mg total) by mouth daily with breakfast. 30 tablet 3  ? traZODone (DESYREL) 150 MG tablet Take 1 tablet (150 mg total) by mouth at bedtime. 30 tablet 3  ? ?No current facility-administered medications for this visit.  ? ? ? ?Musculoskeletal: ?Strength & Muscle Tone: within normal limits ?Gait & Station: normal ?Patient leans: N/A ? ?Psychiatric Specialty Exam: ?Review of Systems  ?Psychiatric/Behavioral:  Positive for sleep disturbance. Negative for decreased concentration, dysphoric mood, hallucinations, self-injury and suicidal ideas. The patient is nervous/anxious. The patient is not hyperactive.    ?Blood pressure 105/64, pulse 72, resp. rate 18, height 5' 7.5" (1.715 m), weight 160 lb (72.6 kg), SpO2 97 %.Body mass index is 24.69 kg/m?.  ?General Appearance: Well Groomed  ?Eye Contact:  Good  ?Speech:  Clear and Coherent and Normal Rate  ?Volume:  Normal  ?Mood:  Anxious and Euthymic  ?Affect:  Appropriate and Congruent  ?Thought Process:  Coherent and Descriptions of Associations: Intact  ?Orientation:  Full (Time, Place, and Person)  ?Thought Content: WDL   ?Suicidal Thoughts:  No  ?Homicidal Thoughts:  No  ?Memory:  Immediate;   Good ?Recent;   Good ?Remote;   Good  ?Judgement:  Good  ?Insight:  Good  ?Psychomotor Activity:  Normal  ?Concentration:  Concentration: Good and Attention Span: Good  ?Recall:  Good  ?Fund of Knowledge: Good  ?Language: Good  ?Akathisia:  No  ?Handed:  Right  ?AIMS (if indicated): done  ?Assets:  Communication Skills ?Desire for Improvement ?Financial Resources/Insurance ?Housing  ?ADL's:   Intact  ?Cognition: WNL  ?Sleep:  Fair  ? ?Screenings: ?AIMS   ? ?Flowsheet Row Clinical Support from 10/06/2020 in [facility]  ?AIMS Total Score 0  ? ?  ? ?GAD-7   ? ?[place] Office Visit from 02/01/2022 in [facility] Clinical Support from 08/23/2021 in [facility] Clinical Support from 05/22/2021 in [facility] Clinical Support from 02/20/2021 in [facility] Clinical Support from 11/22/2020 in [facility]  ?Total GAD-7 Score 6 1 4 11 2   ? ?  ? ?PHQ2-9   ? ?[place] Office Visit from 02/01/2022 in [facility] Clinical Support from 08/23/2021 in [facility] Clinical Support from 05/22/2021 in [facility] Clinical Support from 02/20/2021 in [place]  from 11/22/2020 in [facility]  ?PHQ-2 Total Score 0 0 0 0 0  ?PHQ-9 Total Score -- 1 0 2 3  ? ?  ? ?[place] Office Visit from 02/01/2022 in [place] from 02/20/2021 in [facility] Clinical Support from 11/22/2020 in [facility]  ?C-SSRS RISK CATEGORY No Risk No Risk No Risk  ? ?  ? ? ? ?Assessment and Plan:  ? ?Curtis Mcdonald is a 66 year old male with a past psychiatric history significant for paranoid schizophrenia and generalized anxiety disorder who presents to [facility] for follow-up and medication management.  Patient reports that he has been having issues with sleep as well as experiencing elevated anxiety.  Patient was recommended increasing his dosage of trazodone from 100 mg to 150 mg at bedtime for the management of his sleep disturbances.  Patient was also recommended  increasing his dosage of hydroxyzine from 10 mg to 25 mg 3 times daily as needed for the management of his anxiety.  Patient was agreeable to recommendations.  Patient's medications to be e-prescribed to pharmacy [facility]

## 2022-05-03 ENCOUNTER — Encounter (HOSPITAL_COMMUNITY): Payer: Self-pay | Admitting: Physician Assistant

## 2022-05-11 ENCOUNTER — Telehealth (INDEPENDENT_AMBULATORY_CARE_PROVIDER_SITE_OTHER): Payer: No Payment, Other | Admitting: Psychiatry

## 2022-05-11 ENCOUNTER — Encounter (HOSPITAL_COMMUNITY): Payer: Self-pay | Admitting: Psychiatry

## 2022-05-11 DIAGNOSIS — F200 Paranoid schizophrenia: Secondary | ICD-10-CM

## 2022-05-11 DIAGNOSIS — F411 Generalized anxiety disorder: Secondary | ICD-10-CM | POA: Diagnosis not present

## 2022-05-11 MED ORDER — LURASIDONE HCL 40 MG PO TABS: 40.0000 mg | ORAL_TABLET | Freq: Every day | ORAL | 3 refills | Status: DC

## 2022-05-11 MED ORDER — BUSPIRONE HCL 10 MG PO TABS: 10.0000 mg | ORAL_TABLET | Freq: Three times a day (TID) | ORAL | 3 refills | Status: DC

## 2022-05-11 MED ORDER — HYDROXYZINE HCL 25 MG PO TABS: 25.0000 mg | ORAL_TABLET | Freq: Three times a day (TID) | ORAL | 3 refills | Status: DC | PRN

## 2022-05-11 MED ORDER — TRAZODONE HCL 150 MG PO TABS: 150.0000 mg | ORAL_TABLET | Freq: Every day | ORAL | 3 refills | Status: DC

## 2022-05-11 NOTE — Progress Notes (Signed)
BH MD/PA/NP OP Progress Note Virtual Visit via Video Note  I connected with Curtis Mcdonald on 05/11/22 at 10:00 AM EDT by a video enabled telemedicine application and verified that I am speaking with the correct person using two identifiers.  Location: Patient: Home Provider: Clinic   I discussed the limitations of evaluation and management by telemedicine and the availability of in person appointments. The patient expressed understanding and agreed to proceed.  I provided 30 minutes of non-face-to-face time during this encounter.   05/11/2022 10:24 AM Curtis Mcdonald  MRN:  [id number]  Chief Complaint: "Im okay"   HPI: 66 year old male seen today for follow up psychiatric evaluation.  He has a psychiatric history of schizophrenia, anxiety, and cocaine abuse.  He is currently managed on Latuda 40 mg daily, Buspar 10 mg thee times daily, hydroxyzine 10 mg three times daily, and trazodone 150 mg at bedtime. He notes that his medications are effective in managing his psychiatric conditions.    Today he is pleasant, cooperative, engaged in conversation, and maintained eye contacting. He informed provider that overall he is doing okay. At times he notes that he worries about how his life will tun out but reports that he is able to cope with it.  Today provider conducted a GAD-7 and patient scored a 9.  Provider also conducted PHQ-9 and patient scored a 3.  Since increasing trazodone he reports his sleep has improved.  Today he denies SI/HI/VAH, mania, or paranoia.     No medication changes made today. He will continue all medications as prescribed.   No other concerns noted at this time.  Visit Diagnosis:    ICD-10-CM   1. Generalized anxiety disorder  F41.1 busPIRone (BUSPAR) 10 MG tablet    hydrOXYzine (ATARAX) 25 MG tablet    2. Paranoid schizophrenia ([place])  F20.0 lurasidone (LATUDA) 40 MG TABS tablet    traZODone (DESYREL) 150 MG tablet      Past Psychiatric History:  schizophrenia,  anxiety, and cocaine abuse  Past Medical History: History reviewed. No pertinent past medical history.  Past Surgical History:  Procedure Laterality Date   WISDOM TOOTH EXTRACTION      Family Psychiatric History: Schizophrenia, anxiety, cocaine abuse  Family History:  Family History  Problem Relation Age of Onset   CAD Neg Hx    Diabetes Neg Hx     Social History:  Social History   Socioeconomic History   Marital status: Single    Spouse name: Not on file   Number of children: Not on file   Years of education: Not on file   Highest education level: Not on file  Occupational History   Occupation: Atm assembly    Comment: [employer]  Tobacco Use   Smoking status: Never   Smokeless tobacco: Never  Substance and Sexual Activity   Alcohol use: No    Comment: fomer    Drug use: No   Sexual activity: Not Currently  Other Topics Concern   Not on file  Social History Narrative   Not on file   Social Determinants of Health   Financial Resource Strain: Low Risk  (03/30/2020)   Overall Financial Resource Strain (CARDIA)    Difficulty of Paying Living Expenses: Not very hard  Food Insecurity: No Food Insecurity (03/30/2020)   Hunger Vital Sign    Worried About Running Out of Food in the Last Year: Never true    Ran Out of Food in the Last Year: Never true  Transportation Needs: No Transportation Needs (03/30/2020)   PRAPARE - Hydrologist (Medical): No    Lack of Transportation (Non-Medical): No  Physical Activity: Insufficiently Active (08/19/2020)   Exercise Vital Sign    Days of Exercise per Week: 3 days    Minutes of Exercise per Session: 30 min  Stress: No Stress Concern Present (06/30/2020)   [place]    Feeling of Stress : Only a little  Social Connections: Moderately Isolated (08/19/2020)   Social Connection and Isolation Panel [NHANES]    Frequency of Communication with  Friends and Family: Once a week    Frequency of Social Gatherings with Friends and Family: Once a week    Attends Religious Services: More than 4 times per year    Active Member of Clubs or Organizations: Yes    Attends Music therapist: More than 4 times per year    Marital Status: Divorced    Allergies: No Known Allergies  Metabolic Disorder Labs: Lab Results  Component Value Date   HGBA1C 5.1 04/12/2016   MPG 100 04/12/2016   No results found for: "PROLACTIN" Lab Results  Component Value Date   CHOL 136 04/12/2016   TRIG 68 04/12/2016   HDL 38 (L) 04/12/2016   CHOLHDL 3.6 04/12/2016   VLDL 14 04/12/2016   LDLCALC 84 04/12/2016   LDLCALC 94 01/11/2010   Lab Results  Component Value Date   TSH 0.330 (L) 04/12/2016    Therapeutic Level Labs: No results found for: "LITHIUM" No results found for: "VALPROATE" No results found for: "CBMZ"  Current Medications: Current Outpatient Medications  Medication Sig Dispense Refill   busPIRone (BUSPAR) 10 MG tablet Take 1 tablet (10 mg total) by mouth 3 (three) times daily. 90 tablet 3   hydrOXYzine (ATARAX) 25 MG tablet Take 1 tablet (25 mg total) by mouth 3 (three) times daily as needed for anxiety. 90 tablet 3   lurasidone (LATUDA) 40 MG TABS tablet Take 1 tablet (40 mg total) by mouth daily with breakfast. 30 tablet 3   traZODone (DESYREL) 150 MG tablet Take 1 tablet (150 mg total) by mouth at bedtime. 30 tablet 3   No current facility-administered medications for this visit.     Musculoskeletal: Strength & Muscle Tone: within normal limits and telehealth Gait & Station: normal, Telehealth  Patient leans: N/A  Psychiatric Specialty Exam: Review of Systems  There were no vitals taken for this visit.There is no height or weight on file to calculate BMI.  General Appearance: Well Groomed  Eye Contact:  Good  Speech:  Clear and Coherent and Normal Rate  Volume:  Normal  Mood:  Euthymic  Affect:  Congruent   Thought Process:  Coherent, Goal Directed and Linear  Orientation:  Full (Time, Place, and Person)  Thought Content: WDL and Logical   Suicidal Thoughts:  No  Homicidal Thoughts:  No  Memory:  Immediate;   Good Recent;   Good Remote;   Good  Judgement:  Good  Insight:  Good  Psychomotor Activity:  Normal  Concentration:  Concentration: Good and Attention Span: Good  Recall:  Good  Fund of Knowledge: Good  Language: Good  Akathisia:  No  Handed:  Right  AIMS (if indicated): done  Assets:  Communication Skills Desire for Improvement Financial Resources/Insurance Housing  ADL's:  Intact  Cognition: WNL  Sleep:  Good   Screenings: AIMS    Flowsheet Row Clinical  Support from 10/06/2020 in [place] Total Score 0      GAD-7    Flowsheet Row Video Visit from 05/11/2022 in [facility] Office Visit from 02/01/2022 in [facility] Clinical Support from 08/23/2021 in [place] from 05/22/2021 in [place] from 02/20/2021 in [facility]  Total GAD-7 Score 9 6 1 4 11       PHQ2-9    Flowsheet Row Video Visit from 05/11/2022 in [facility] Office Visit from 02/01/2022 in [place] from 08/23/2021 in [place] from 05/22/2021 in [place] from 02/20/2021 in [facility]  PHQ-2 Total Score 2 0 0 0 0  PHQ-9 Total Score 3 -- 1 0 2      [place] Office Visit from 02/01/2022 in [place] from 02/20/2021 in [place] from 11/22/2020 in [place] No Risk No Risk No Risk        Assessment and Plan: Patient notes that he is doing well on his current medication regimen.  No medication changes made today.  Patient agreeable to continue medication as prescribed.  1. Generalized anxiety disorder  Continue- busPIRone (BUSPAR) 10 MG tablet; Take 1 tablet (10 mg total) by mouth 3 (three) times daily.  Dispense: 90 tablet; Refill: 3 Start- hydrOXYzine (ATARAX/VISTARIL) 10 MG tablet; Take 1 tablet (10 mg total) by mouth 3 (three) times daily as needed.  Dispense: 90 tablet; Refill: 3  2. Paranoid schizophrenia ([place])  Continue- lurasidone (LATUDA) 40 MG TABS tablet; Take 1 tablet (40 mg total) by mouth daily with breakfast.  Dispense: 30 tablet; Refill: 3 Continue- traZODone (DESYREL) 150 MG tablet; Take 1 tablet (150 mg total) by mouth at bedtime.  Dispense: 30 tablet; Refill: 3   Follow up in 3  month Follow up with threapy  Salley Slaughter, NP 05/11/2022, 10:24 AM

## 2022-05-13 ENCOUNTER — Encounter (HOSPITAL_COMMUNITY): Payer: Self-pay

## 2022-05-13 ENCOUNTER — Emergency Department (HOSPITAL_COMMUNITY)
Admission: EM | Admit: 2022-05-13 | Discharge: 2022-05-13 | Disposition: A | Discharge status: Home / Self Care | Payer: Commercial Managed Care - HMO | Attending: Emergency Medicine | Admitting: Emergency Medicine

## 2022-05-13 ENCOUNTER — Emergency Department (HOSPITAL_COMMUNITY): Payer: Commercial Managed Care - HMO

## 2022-05-13 ENCOUNTER — Other Ambulatory Visit: Payer: Self-pay

## 2022-05-13 DIAGNOSIS — R531 Weakness: Secondary | ICD-10-CM | POA: Insufficient documentation

## 2022-05-13 DIAGNOSIS — R55 Syncope and collapse: Secondary | ICD-10-CM | POA: Insufficient documentation

## 2022-05-13 DIAGNOSIS — I959 Hypotension, unspecified: Secondary | ICD-10-CM | POA: Insufficient documentation

## 2022-05-13 DIAGNOSIS — R42 Dizziness and giddiness: Secondary | ICD-10-CM | POA: Diagnosis not present

## 2022-05-13 HISTORY — DX: Cerebral infarction, unspecified: I63.9

## 2022-05-13 LAB — BASIC METABOLIC PANEL
Anion gap: 5 (ref 5–15)
BUN: 8 mg/dL (ref 8–23)
CO2: 23 mmol/L (ref 22–32)
Calcium: 8.3 mg/dL — ABNORMAL LOW (ref 8.9–10.3)
Chloride: 110 mmol/L (ref 98–111)
Creatinine, Ser: 1.11 mg/dL (ref 0.61–1.24)
GFR, Estimated: >60 mL/min (ref >60)
Glucose, Bld: 125 mg/dL — ABNORMAL HIGH (ref 70–99)
Potassium: 4.7 mmol/L (ref 3.5–5.1)
Sodium: 138 mmol/L (ref 135–145)

## 2022-05-13 LAB — CBC
HCT: 39.5 % (ref 39.0–52.0)
Hemoglobin: 13.7 g/dL (ref 13.0–17.0)
MCH: 34.2 pg — ABNORMAL HIGH (ref 26.0–34.0)
MCHC: 34.7 g/dL (ref 30.0–36.0)
MCV: 98.5 fL (ref 80.0–100.0)
Platelets: 131 10*3/uL — ABNORMAL LOW (ref 150–400)
RBC: 4.01 MIL/uL — ABNORMAL LOW (ref 4.22–5.81)
RDW: 15.2 % (ref 11.5–15.5)
WBC: 5.5 10*3/uL (ref 4.0–10.5)
nRBC: 1.5 % — ABNORMAL HIGH (ref 0.0–0.2)

## 2022-05-13 LAB — CBG MONITORING, ED: Glucose-Capillary: 179 mg/dL — ABNORMAL HIGH (ref 70–99)

## 2022-05-13 LAB — TROPONIN I (HIGH SENSITIVITY)
Troponin I (High Sensitivity): 3 ng/L (ref <18)
Troponin I (High Sensitivity): 4 ng/L (ref <18)

## 2022-05-13 MED ORDER — SODIUM CHLORIDE 0.9 % IV BOLUS
1000.0000 mL | Freq: Once | INTRAVENOUS | Status: AC
  Administered 2022-05-13: 1000 mL via INTRAVENOUS

## 2022-05-13 NOTE — ED Triage Notes (Signed)
Patient arrived by [facility] from church after becoming dizzy and hypotensive. EMS found patients BP to be in the 80s and 500 NS given enroute. Patient has had recent stroke with left sided deficit. Alert and oriented and denies pain

## 2022-05-13 NOTE — ED Provider Notes (Signed)
[facility] Provider Note   CSN: [id number] Arrival date & time: 05/13/22  1309     History  No chief complaint on file.   Curtis Mcdonald is a 66 y.o. male who presents the emergency department with chief complaint of loss of consciousness.  He has a past medical history of schizophrenia for which he takes Latuda Atarax BuSpar and trazodone.  Patient reports a history of syncope in the past.  Today he was at church when he began to feel lightheaded and then lost consciousness.  Patient denies being seen in the past however review of EMR shows that in 2017 the patient patient had an acute intracranial hemorrhage, seizure and loss of consciousness.  Patient remembers this after being confronted with that history.  He denies any seizures and is not on any seizure medications regularly.  He does feel tremorous after his syncopal event.  He denies chest pain, racing or skipping in his heart, nausea, cold sweats.  He denies using any blood thinners and denies melena or hematochezia.  HPI     Home Medications Prior to Admission medications   Medication Sig Start Date End Date Taking? Authorizing Provider  busPIRone (BUSPAR) 10 MG tablet Take 1 tablet (10 mg total) by mouth 3 (three) times daily. 05/11/22   Shanna CiscoParsons, Brittney E, NP  hydrOXYzine (ATARAX) 25 MG tablet Take 1 tablet (25 mg total) by mouth 3 (three) times daily as needed for anxiety. 05/11/22   Shanna CiscoParsons, Brittney E, NP  lurasidone (LATUDA) 40 MG TABS tablet Take 1 tablet (40 mg total) by mouth daily with breakfast. 05/11/22   Shanna CiscoParsons, Brittney E, NP  traZODone (DESYREL) 150 MG tablet Take 1 tablet (150 mg total) by mouth at bedtime. 05/11/22   Shanna CiscoParsons, Brittney E, NP      Allergies    Patient has no known allergies.    Review of Systems   Review of Systems  Physical Exam Updated Vital Signs BP 131/70   Pulse 75   Temp 98.1 F (36.7 C) (Oral)   Resp 16   SpO2 97%  Physical Exam Vitals and  nursing note reviewed.  Constitutional:      General: He is not in acute distress.    Appearance: He is well-developed. He is not diaphoretic.  HENT:     Head: Normocephalic and atraumatic.  Eyes:     General: No scleral icterus.    Conjunctiva/sclera: Conjunctivae normal.  Cardiovascular:     Rate and Rhythm: Normal rate and regular rhythm.     Heart sounds: Normal heart sounds.  Pulmonary:     Effort: Pulmonary effort is normal. No respiratory distress.     Breath sounds: Normal breath sounds.  Abdominal:     Palpations: Abdomen is soft.     Tenderness: There is no abdominal tenderness.  Musculoskeletal:     Cervical back: Normal range of motion and neck supple.  Skin:    General: Skin is warm and dry.  Neurological:     General: No focal deficit present.     Mental Status: He is alert and oriented to person, place, and time.     Cranial Nerves: No cranial nerve deficit.     Sensory: No sensory deficit.     Motor: Tremor present. No weakness.     Coordination: Coordination normal.     Gait: Gait normal.     Deep Tendon Reflexes: Reflexes normal.  Psychiatric:        Behavior:  Behavior normal.     ED Results / Procedures / Treatments   Labs (all labs ordered are listed, but only abnormal results are displayed) Labs Reviewed  BASIC METABOLIC PANEL - Abnormal; Notable for the following components:      Result Value   Glucose, Bld 125 (*)    Calcium 8.3 (*)    All other components within normal limits  CBC - Abnormal; Notable for the following components:   RBC 4.01 (*)    MCH 34.2 (*)    Platelets 131 (*)    nRBC 1.5 (*)    All other components within normal limits  CBG MONITORING, ED - Abnormal; Notable for the following components:   Glucose-Capillary 179 (*)    All other components within normal limits  TROPONIN I (HIGH SENSITIVITY)  TROPONIN I (HIGH SENSITIVITY)    EKG EKG Interpretation  Date/Time:  Sunday May 13 2022 13:19:15 EDT Ventricular Rate:   74 PR Interval:  140 QRS Duration: 74 QT Interval:  436 QTC Calculation: 483 R Axis:   0 Text Interpretation: Normal sinus rhythm Minimal voltage criteria for LVH, may be normal variant ( R in aVL ) Borderline ECG No significant change since last tracing Confirmed by Melene Plan [phone]) on 05/13/2022 1:35:02 PM  Radiology CT Head Wo Contrast  Result Date: 05/13/2022 CLINICAL DATA:  Syncope/presyncope, cerebrovascular cause suspected. Dizziness and hypotension. EXAM: CT HEAD WITHOUT CONTRAST TECHNIQUE: Contiguous axial images were obtained from the base of the skull through the vertex without intravenous contrast. RADIATION DOSE REDUCTION: This exam was performed according to the departmental dose-optimization program which includes automated exposure control, adjustment of the mA and/or kV according to patient size and/or use of iterative reconstruction technique. COMPARISON:  Head CT 04/11/2016 and MRI 04/12/2016 FINDINGS: Brain: There is no evidence of an acute infarct, intracranial hemorrhage, mass, midline shift, or extra-axial fluid collection. The ventricles and sulci are normal. There is an unchanged chronic lacunar infarct in the right thalamus. Vascular: Calcified atherosclerosis at the skull base. No hyperdense vessel. Skull: No fracture or suspicious osseous lesion. Sinuses/Orbits: Visualized paranasal sinuses and mastoid air cells are clear. Unremarkable orbits. Other: None. IMPRESSION: 1. No evidence of acute intracranial abnormality. 2. Chronic right thalamic lacunar infarct. Electronically Signed   By: Sebastian Ache M.D.   On: 05/13/2022 14:53   DG Chest 2 View  Result Date: 05/13/2022 CLINICAL DATA:  Dizziness. EXAM: CHEST - 2 VIEW COMPARISON:  04/12/2016 FINDINGS: The lungs are clear without focal pneumonia, edema, pneumothorax or pleural effusion. The cardiopericardial silhouette is within normal limits for size. The visualized bony structures of the thorax are unremarkable. IMPRESSION:  No active cardiopulmonary disease. Electronically Signed   By: Kennith Center M.D.   On: 05/13/2022 13:43    Procedures Procedures    Medications Ordered in ED Medications - No data to display  ED Course/ Medical Decision Making/ A&P                           Medical Decision Making 66 year old male who presents for syncope while at church.  He has a history of syncope and collapse in the past.  EMS reports that the patient's blood pressure was in the 80s he was given fluids prior to arrival I suspect likely vasovagal syncope.  He does not take believe blood pressure medications however obviously we will need to rule out other causes.  I have ordered labs, continued fluid resuscitation, EKG chest x-ray,  and head CT.  On cardiac monitor patient appears to be in normal sinus rhythm and is currently normotensive.  Not given to PA Laveda Norman at shift change.  Amount and/or Complexity of Data Reviewed Labs: ordered. Radiology: ordered. ECG/medicine tests: ordered.           Final Clinical Impression(s) / ED Diagnoses Final diagnoses:  None    Rx / DC Orders ED Discharge Orders     None         Arthor Captain, PA-C 05/13/22 1617    Melene Plan, DO 05/14/22 1133

## 2022-05-13 NOTE — ED Provider Notes (Signed)
[place] [facility] EMERGENCY DEPARTMENT Provider Note   CSN: [id number] Arrival date & time: 05/13/22  1309     History  No chief complaint on file.   GEVIN ORTON is a 66 y.o. male.  The history is provided by the patient and medical records. No language interpreter was used.    66 year old male with significant history of schizophrenia, generalized anxiety, history of intracranial hemorrhage who brought here via EMS from triage with complaints of dizziness.  Patient report he was at church today and while standing he felt lightheadedness and dizzy and having to sit down.  He had a near syncopal episode and people in charge brought him outside to get some fresh air.  EMS was called because.  per EMS note, patient was noted to have a blood pressure of 80 systolic initially therefore 500 mL of normal saline was given on route.  Patient report after receiving IV fluid and rest he is now feeling much better.  He does not endorse any headache, chest pain, trouble breathing, abdominal pain, nausea vomiting diarrhea, new focal numbness or new focal weakness.  He did recall having 1 similar syncopal episode 3 weeks ago while he was in the store.  He did eat his breakfast this morning and felt fine last night.  He denies any recent medication changes.  He denies any active pain.  Home Medications Prior to Admission medications   Medication Sig Start Date End Date Taking? Authorizing Provider  busPIRone (BUSPAR) 10 MG tablet Take 1 tablet (10 mg total) by mouth 3 (three) times daily. 05/11/22   Salley Slaughter, NP  hydrOXYzine (ATARAX) 25 MG tablet Take 1 tablet (25 mg total) by mouth 3 (three) times daily as needed for anxiety. 05/11/22   Salley Slaughter, NP  lurasidone (LATUDA) 40 MG TABS tablet Take 1 tablet (40 mg total) by mouth daily with breakfast. 05/11/22   Salley Slaughter, NP  traZODone (DESYREL) 150 MG tablet Take 1 tablet (150 mg total) by mouth at bedtime. 05/11/22    Salley Slaughter, NP      Allergies    Patient has no known allergies.    Review of Systems   Review of Systems  All other systems reviewed and are negative.   Physical Exam Updated Vital Signs BP 131/70   Pulse 75   Temp 98.1 F (36.7 C) (Oral)   Resp 16   SpO2 97%  Physical Exam Vitals and nursing note reviewed.  Constitutional:      General: He is not in acute distress.    Appearance: He is well-developed.  HENT:     Head: Normocephalic and atraumatic.  Eyes:     Conjunctiva/sclera: Conjunctivae normal.  Cardiovascular:     Rate and Rhythm: Normal rate and regular rhythm.     Pulses: Normal pulses.     Heart sounds: Normal heart sounds.  Pulmonary:     Effort: Pulmonary effort is normal.     Breath sounds: Normal breath sounds.  Abdominal:     Palpations: Abdomen is soft.  Musculoskeletal:     Cervical back: Neck supple.  Skin:    Findings: No rash.  Neurological:     Mental Status: He is alert and oriented to person, place, and time.     Comments: Baseline left-sided weakness compared to right due to prior stroke.     ED Results / Procedures / Treatments   Labs (all labs ordered are listed, but only abnormal results  are displayed) Labs Reviewed  BASIC METABOLIC PANEL - Abnormal; Notable for the following components:      Result Value   Glucose, Bld 125 (*)    Calcium 8.3 (*)    All other components within normal limits  CBC - Abnormal; Notable for the following components:   RBC 4.01 (*)    MCH 34.2 (*)    Platelets 131 (*)    nRBC 1.5 (*)    All other components within normal limits  CBG MONITORING, ED - Abnormal; Notable for the following components:   Glucose-Capillary 179 (*)    All other components within normal limits  TROPONIN I (HIGH SENSITIVITY)  TROPONIN I (HIGH SENSITIVITY)    EKG EKG Interpretation  Date/Time:  Sunday May 13 2022 13:19:15 EDT Ventricular Rate:  74 PR Interval:  140 QRS Duration: 74 QT  Interval:  436 QTC Calculation: 483 R Axis:   0 Text Interpretation: Normal sinus rhythm Minimal voltage criteria for LVH, may be normal variant ( R in aVL ) Borderline ECG No significant change since last tracing Confirmed by Deno Etienne [phone]) on 05/13/2022 1:35:02 PM  Radiology CT Head Wo Contrast  Result Date: 05/13/2022 CLINICAL DATA:  Syncope/presyncope, cerebrovascular cause suspected. Dizziness and hypotension. EXAM: CT HEAD WITHOUT CONTRAST TECHNIQUE: Contiguous axial images were obtained from the base of the skull through the vertex without intravenous contrast. RADIATION DOSE REDUCTION: This exam was performed according to the departmental dose-optimization program which includes automated exposure control, adjustment of the mA and/or kV according to patient size and/or use of iterative reconstruction technique. COMPARISON:  Head CT 04/11/2016 and MRI 04/12/2016 FINDINGS: Brain: There is no evidence of an acute infarct, intracranial hemorrhage, mass, midline shift, or extra-axial fluid collection. The ventricles and sulci are normal. There is an unchanged chronic lacunar infarct in the right thalamus. Vascular: Calcified atherosclerosis at the skull base. No hyperdense vessel. Skull: No fracture or suspicious osseous lesion. Sinuses/Orbits: Visualized paranasal sinuses and mastoid air cells are clear. Unremarkable orbits. Other: None. IMPRESSION: 1. No evidence of acute intracranial abnormality. 2. Chronic right thalamic lacunar infarct. Electronically Signed   By: Logan Bores M.D.   On: 05/13/2022 14:53   DG Chest 2 View  Result Date: 05/13/2022 CLINICAL DATA:  Dizziness. EXAM: CHEST - 2 VIEW COMPARISON:  04/12/2016 FINDINGS: The lungs are clear without focal pneumonia, edema, pneumothorax or pleural effusion. The cardiopericardial silhouette is within normal limits for size. The visualized bony structures of the thorax are unremarkable. IMPRESSION: No active cardiopulmonary disease.  Electronically Signed   By: Misty Stanley M.D.   On: 05/13/2022 13:43    Procedures Procedures    Medications Ordered in ED Medications  sodium chloride 0.9 % bolus 1,000 mL (0 mLs Intravenous Stopped 05/13/22 1816)    ED Course/ Medical Decision Making/ A&P                           Medical Decision Making Amount and/or Complexity of Data Reviewed Labs: ordered. Radiology: ordered. ECG/medicine tests: ordered.   BP 131/70   Pulse 75   Temp 98.1 F (36.7 C) (Oral)   Resp 16   SpO2 97%   4:19 PM This is a 66 year old male who is brought here via EMS from church for evaluation of a near syncopal episode.  Patient recall he was standing in church when he felt lightheadedness and dizziness and had a near syncopal episode.  Chest member brought him outside and  EMS was called.  When EMS arrived, patient was noted to have a blood pressure in the 80s systolic and therefore patient received 500 mL of normal saline on route.  Once arrived, patient report he is feeling better.  He denies having headache, confusion, chest pain, trouble breathing, abdominal pain, back pain, new focal numbness or weakness.  He denies any recent medication changes.  He did eat breakfast this morning.  He recall having 1 similar syncopal episode approximate 3 weeks ago when he was in the store but did not get evaluated.  Labs, EKG and imaging independently viewed interpreted by me and I agree with radiologist interpretation.  Patient's labs are reassuring, no significant electrolyte derangement, normal WBC, normal H&H.  EKG without concerning arrhythmia or ischemic changes.  Initial troponin is normal.  CT scan of the head showed no acute intracranial abnormality patient does have a chronic right thalamic lacunar infarct noted.  Chest x-ray without concerning finding.  Although his creatinine is 1.11 and within normal limit it is slightly worse than his baseline.  We will give IV fluid  8:33 PM After receiving IV  fluid, patient report feeling better.  He was able to ambulate without difficulty and is back to baseline.  At this time I have low suspicion for seizures causing his symptoms.  I felt patient should follow-up outpatient for further evaluation of his near syncope.  He may benefit from echocardiogram.  We will give referral.  Return precaution given.  This patient presents to the ED for concern of near syncope, this involves an extensive number of treatment options, and is a complaint that carries with it a high risk of complications and morbidity.  The differential diagnosis includes vasovagal, cardiac arrhythmia, dehydration, electrolytes derangement, stroke, PE, seizure  Co morbidities that complicate the patient evaluation stroke Additional history obtained:  Additional history obtained from patient External records from outside source obtained and reviewed including EMR including prior labs and imaging  Lab Tests:  I Ordered, and personally interpreted labs.  The pertinent results include:  as above  Imaging Studies ordered:  I ordered imaging studies including head CT I independently visualized and interpreted imaging which showed no acute finding I agree with the radiologist interpretation  Cardiac Monitoring:  The patient was maintained on a cardiac monitor.  I personally viewed and interpreted the cardiac monitored which showed an underlying rhythm of: NSR  Medicines ordered and prescription drug management:  I ordered medication including IVF  for near syncope Reevaluation of the patient after these medicines showed that the patient improved I have reviewed the patients home medicines and have made adjustments as needed  Test Considered: as above  Critical Interventions: IVF  Consultations Obtained:  I requested consultation with the attending Dr. Silverio Lay,  and discussed lab and imaging findings as well as pertinent plan - they recommend: work up  Problem List / ED  Course: near syncope  hypotension  Reevaluation:  After the interventions noted above, I reevaluated the patient and found that they have :improved  Social Determinants of Health: lack of physical activity  Dispostion:  After consideration of the diagnostic results and the patients response to treatment, I feel that the patent would benefit from outpt.         Final Clinical Impression(s) / ED Diagnoses Final diagnoses:  Near syncope    Rx / DC Orders ED Discharge Orders     None         Fayrene Helper, PA-C 05/13/22 2043  Charlynne Pander, MD 05/17/22 [id number]

## 2022-05-13 NOTE — Discharge Instructions (Addendum)
You have been evaluated for your symptoms.  You may have experienced what we call a vasovagal near syncope.  Please stay hydrated, and follow-up with your primary care doctor for outpatient evaluation of your condition.  You may benefit from an ultrasound of your heart.  If your symptoms worsen, do not hesitate to return to the ER for further assessment.

## 2022-05-13 NOTE — ED Notes (Signed)
Patient transported to CT 

## 2022-05-13 NOTE — ED Notes (Signed)
Patient ambulated around the nursing station. No lightheadedness or dizziness noted while ambulating. Patient is now laying in bed. Patient was given something to eat and drink.

## 2022-06-07 ENCOUNTER — Encounter: Payer: Self-pay | Admitting: Internal Medicine

## 2022-07-31 ENCOUNTER — Telehealth (HOSPITAL_COMMUNITY): Payer: No Payment, Other | Admitting: Psychiatry

## 2022-08-13 NOTE — Patient Instructions (Incomplete)
Thank you for attending your appointment today.  -- We did not make any medication changes today. Please continue medications as prescribed. -- Please reach out to [facility] Wellness at [phone] to make an appointment with a primary care provider.  Please do not make any changes to medications without first discussing with your provider. If you are experiencing a psychiatric emergency, please call 911 or present to your nearest emergency department. Additional crisis, medication management, and therapy resources are included below.  [facility]  [address] [place], [place] [address] [phone] WALK-IN URGENT CARE 24/7 FOR ANYONE [address] [place], [place]  [phone] Fax: [phone] [email or web address] *Interpreters available *Accepts all insurance and uninsured for Urgent Care needs *Accepts Medicaid and uninsured for outpatient treatment (below)      ONLY FOR [facility]  Below:    Outpatient New Patient Assessment/Therapy Walk-ins:        Monday -Thursday 8am until slots are full.        Every Friday 1pm-4pm  (first come, first served)                   New Patient Psychiatry/Medication Management        Monday-Friday 8am-11am (first come, first served)               For all walk-ins we ask that you arrive by 7:15am, because patients will be seen in the order of arrival.

## 2022-08-13 NOTE — Progress Notes (Unsigned)
[facility] MD Outpatient Progress Note  08/14/2022 4:38 PM Curtis Mcdonald  MRN:  [id number]  Assessment:  Curtis Mcdonald presents for follow-up evaluation. Today, 08/14/22, patient reports overall psychiatric stability on below regimen and has been taking medications as prescribed.  He denies any signs/symptoms of mood instability or psychosis at this time.  He does endorse occasional episodes of anxiety that are well-controlled with behavioral strategies and use of as needed hydroxyzine.  Patient endorses new onset bilateral hand tremor that is potentially worse with caffeine and action; denies at rest.  This seems less likely related to medication regimen however will have patient come into the office for next visit for further evaluation and AIMS assessment.  Patient was also provided with information to establish with local primary care.  Plan to return to care in 3 months.  Identifying Information: Curtis Mcdonald is a 66 y.o. male with a history of schizophrenia and anxiety who is an established patient with [facility] participating in follow-up via video conferencing.   Plan:  # Schizophrenia  Anxiety Past medication trials: unknown Status of problem: stable Interventions: -- Continue Latuda 40 mg daily -- Continue Buspar 10 mg TID -- Continue hydroxyzine 25 mg three times daily PRN anxiety  # Sleep Past medication trials: unknown Status of problem: stable Interventions: -- Continue trazodone 150 mg nightly  # Tremor, consider physiologic or essential tremor Past medication trials: none Status of problem: New problem to this provider Interventions: -- Plan for next visit to be in person to better assess and conduct AIMS -- Patient was provided with information for [facility] and Wellness to establish with primary care provider  Patient was given contact information for behavioral health clinic and was instructed to call 911 for emergencies.    Subjective:  Chief Complaint:  Chief Complaint  Patient presents with   Medication Management    Interval History:   Last seen by Eulis Canner, NP on 05/11/22. At that time, managed on:  Latuda 40 mg daily Buspar 10 mg TID hydroxyzine 25 mg three times daily PRN trazodone 150 mg nightly During that visit, no changes were made.   Today, patient reports he has been doing "well" and reports good mood. Some episodes of anxiety in which he worries about the future but infrequent and denies leading to dysfunction. Handles it by watching TV or reading his Bible. Uses hydroxyzine typically three times a day with noted benefit. Verified current medications and denies any side effects. Sometimes forgets to take medications but has started adding phone reminders and this has helped with compliance.   Denies SI, HI. Denies AVH. Denies paranoia.   At the end of the visit, patient endorses bilateral hand tremor that occurs intermittently; believes this is new to the past month or so.  Feels that it may be worse after coffee; worse when reaching for things and denies occurring at rest.  Unable to assess by video due to low video quality but patient was amenable to coming in person for next office visit.  Patient currently does not have a primary care provider and was provided with information for community health and wellness.  Visit Diagnosis:    ICD-10-CM   1. Paranoid schizophrenia ([place])  F20.0 lurasidone (LATUDA) 40 MG TABS tablet    traZODone (DESYREL) 150 MG tablet    2. Generalized anxiety disorder  F41.1 busPIRone (BUSPAR) 10 MG tablet    hydrOXYzine (ATARAX) 25 MG tablet      Past  Psychiatric History:  Diagnoses: schizophrenia, anxiety, cocaine abuse  Medication trials: unknown Hospitalizations: denies Suicide attempts: denies Hx of abuse: denies Substance use:   -- Denies use of cannabis, cocaine (last used in 9826), or illicit drug use  -- Etoh: denies  -- Tobacco:  denies  -- Caffeine: 1 cup once a week  Past Medical History:  Past Medical History:  Diagnosis Date   Stroke [facility])     Past Surgical History:  Procedure Laterality Date   WISDOM TOOTH EXTRACTION      Family Psychiatric History: denies  Family History:  Family History  Problem Relation Age of Onset   CAD Neg Hx    Diabetes Neg Hx     Social History:  Social History   Socioeconomic History   Marital status: Single    Spouse name: Not on file   Number of children: Not on file   Years of education: Not on file   Highest education level: Not on file  Occupational History   Occupation: Atm assembly    Comment: [employer]  Tobacco Use   Smoking status: Never   Smokeless tobacco: Never  Substance and Sexual Activity   Alcohol use: No    Comment: fomer    Drug use: No   Sexual activity: Not Currently  Other Topics Concern   Not on file  Social History Narrative   Not on file   Social Determinants of Health   Financial Resource Strain: Low Risk  (03/30/2020)   Overall Financial Resource Strain (CARDIA)    Difficulty of Paying Living Expenses: Not very hard  Food Insecurity: No Food Insecurity (03/30/2020)   Hunger Vital Sign    Worried About Running Out of Food in the Last Year: Never true    Ran Out of Food in the Last Year: Never true  Transportation Needs: No Transportation Needs (03/30/2020)   PRAPARE - Hydrologist (Medical): No    Lack of Transportation (Non-Medical): No  Physical Activity: Insufficiently Active (08/19/2020)   Exercise Vital Sign    Days of Exercise per Week: 3 days    Minutes of Exercise per Session: 30 min  Stress: No Stress Concern Present (06/30/2020)   [place]    Feeling of Stress : Only a little  Social Connections: Moderately Isolated (08/19/2020)   Social Connection and Isolation Panel [NHANES]    Frequency of Communication with Friends  and Family: Once a week    Frequency of Social Gatherings with Friends and Family: Once a week    Attends Religious Services: More than 4 times per year    Active Member of [employer] or Organizations: Yes    Attends Music therapist: More than 4 times per year    Marital Status: Divorced    Allergies: No Known Allergies  Current Medications: Current Outpatient Medications  Medication Sig Dispense Refill   busPIRone (BUSPAR) 10 MG tablet Take 1 tablet (10 mg total) by mouth 3 (three) times daily. 90 tablet 2   hydrOXYzine (ATARAX) 25 MG tablet Take 1 tablet (25 mg total) by mouth 3 (three) times daily as needed for anxiety. 90 tablet 2   lurasidone (LATUDA) 40 MG TABS tablet Take 1 tablet (40 mg total) by mouth daily with breakfast. 30 tablet 2   traZODone (DESYREL) 150 MG tablet Take 1 tablet (150 mg total) by mouth at bedtime. 30 tablet 2   No current facility-administered medications for this  visit.    ROS: Review of Systems  Objective:  Psychiatric Specialty Exam: There were no vitals taken for this visit.There is no height or weight on file to calculate BMI.  General Appearance: Casual and Fairly Groomed  Eye Contact:  Good  Speech:  Clear and Coherent and Normal Rate  Volume:  Normal  Mood:   "good"  Affect:  Constricted and euthymic  Thought Content:  Denies AVH; IOR; paranoia    Suicidal Thoughts:  No  Homicidal Thoughts:  No  Thought Process:  Goal Directed and Linear  Orientation:  Full (Time, Place, and Person)    Memory:   Grossly intact  Judgment:  Good  Insight:  Good  Concentration:  Concentration: Good  Recall:  NA  Fund of Knowledge: Good  Language: Good  Psychomotor Activity:   Patient endorses bilateral hand tremor although not visible over video  Akathisia:  No  AIMS (if indicated): not done  Assets:  Communication Skills Desire for Improvement Housing Leisure Time Physical Health Transportation  ADL's:  Intact  Cognition: WNL   Sleep:  Good   PE: General: sits comfortably in view of camera; no acute distress  Pulm: no increased work of breathing on room air  MSK: all extremity movements appear intact  Neuro: no focal neurological deficits observed  Gait & Station: unable to assess by video    Metabolic Disorder Labs: Lab Results  Component Value Date   HGBA1C 5.1 04/12/2016   MPG 100 04/12/2016   No results found for: "PROLACTIN" Lab Results  Component Value Date   CHOL 136 04/12/2016   TRIG 68 04/12/2016   HDL 38 (L) 04/12/2016   CHOLHDL 3.6 04/12/2016   VLDL 14 04/12/2016   LDLCALC 84 04/12/2016   LDLCALC 94 01/11/2010   Lab Results  Component Value Date   TSH 0.330 (L) 04/12/2016    Therapeutic Level Labs: No results found for: "LITHIUM" No results found for: "VALPROATE" No results found for: "CBMZ"  Screenings:  AIMS    Flowsheet Row Clinical Support from 10/06/2020 in [facility] Total Score 0      GAD-7    Flowsheet Row Video Visit from 05/11/2022 in [facility] Office Visit from 02/01/2022 in [facility] from 08/23/2021 in [place] from 05/22/2021 in [facility] from 02/20/2021 in [facility]  Total GAD-7 Score _0 CHY8-5    Flowsheet Row Video Visit from 05/11/2022 in [facility] Office Visit from 02/01/2022 in [place] from 08/23/2021 in [facility] from 05/22/2021 in [facility] from 02/20/2021 in [facility]  PHQ-2 Total Score 2 0 0 0 0  PHQ-9 Total Score 3 -- 1 0 2      Flowsheet Row ED from 05/13/2022 in [facility] Office Visit from 02/01/2022 in [facility] from 02/20/2021 in [facility] No Risk No Risk No Risk       Collaboration of Care: Collaboration of Care: Medication Management AEB ongoing medication management, Primary Care Provider AEB patient provided with resources to establish with local PCP, and Psychiatrist AEB established with this provider  Patient/Guardian was advised Release of Information must be obtained prior to any record release in order to collaborate their care with an outside provider. Patient/Guardian was advised if they have not already done so to contact the registration department to sign all necessary forms in order for [place] to release information regarding their care.   Consent: Patient/Guardian gives verbal consent for treatment and assignment of benefits for services provided during this visit. Patient/Guardian expressed understanding and agreed to proceed.   Televisit via video: I connected withNAME@ on 08/14/22 at  3:30 PM EST by a video enabled telemedicine application and verified that I am speaking with the correct person using two identifiers.  Location: Patient: Home address in [place] Provider: clinic   I discussed the limitations of evaluation and management by telemedicine and the availability of in person appointments. The patient expressed understanding and agreed to proceed.  I discussed the assessment and treatment plan with the patient. The patient was provided an opportunity to ask questions and all were answered. The patient agreed with the plan and demonstrated an understanding of the instructions.   The patient was advised to call back or seek an in-person evaluation if the symptoms worsen or if the condition fails to improve as anticipated.  I provided 40 minutes of non-face-to-face time during this encounter.  [place] Prevo A  08/14/2022, 4:38 PM

## 2022-08-14 ENCOUNTER — Encounter (HOSPITAL_COMMUNITY): Payer: Self-pay | Admitting: Psychiatry

## 2022-08-14 ENCOUNTER — Telehealth (INDEPENDENT_AMBULATORY_CARE_PROVIDER_SITE_OTHER): Payer: Commercial Managed Care - HMO | Admitting: Psychiatry

## 2022-08-14 DIAGNOSIS — F411 Generalized anxiety disorder: Secondary | ICD-10-CM

## 2022-08-14 DIAGNOSIS — F200 Paranoid schizophrenia: Secondary | ICD-10-CM

## 2022-08-14 MED ORDER — HYDROXYZINE HCL 25 MG PO TABS
25.0000 mg | ORAL_TABLET | Freq: Three times a day (TID) | ORAL | 2 refills | Status: DC | PRN
Start: 2022-08-14 — End: 2022-11-01

## 2022-08-14 MED ORDER — BUSPIRONE HCL 10 MG PO TABS: 10.0000 mg | ORAL_TABLET | Freq: Three times a day (TID) | ORAL | 2 refills | Status: DC

## 2022-08-14 MED ORDER — TRAZODONE HCL 150 MG PO TABS: 150.0000 mg | ORAL_TABLET | Freq: Every evening | ORAL | 2 refills | Status: DC

## 2022-08-14 MED ORDER — LURASIDONE HCL 40 MG PO TABS: 40.0000 mg | ORAL_TABLET | Freq: Every day | ORAL | 2 refills | Status: DC

## 2022-11-01 ENCOUNTER — Ambulatory Visit (INDEPENDENT_AMBULATORY_CARE_PROVIDER_SITE_OTHER): Payer: Medicaid Other | Admitting: Student

## 2022-11-01 DIAGNOSIS — F411 Generalized anxiety disorder: Secondary | ICD-10-CM | POA: Diagnosis not present

## 2022-11-01 DIAGNOSIS — F200 Paranoid schizophrenia: Secondary | ICD-10-CM

## 2022-11-01 MED ORDER — HYDROXYZINE HCL 25 MG PO TABS
25.0000 mg | ORAL_TABLET | Freq: Three times a day (TID) | ORAL | 2 refills | Status: DC | PRN
Start: 2022-11-01 — End: 2023-01-24

## 2022-11-01 MED ORDER — LURASIDONE HCL 40 MG PO TABS: 40.0000 mg | ORAL_TABLET | Freq: Every day | ORAL | 2 refills | Status: DC

## 2022-11-01 MED ORDER — BUSPIRONE HCL 10 MG PO TABS: 10.0000 mg | ORAL_TABLET | Freq: Three times a day (TID) | ORAL | 2 refills | Status: DC

## 2022-11-01 MED ORDER — TRAZODONE HCL 150 MG PO TABS: 150.0000 mg | ORAL_TABLET | Freq: Every evening | ORAL | 2 refills | Status: DC

## 2022-11-02 ENCOUNTER — Encounter (HOSPITAL_COMMUNITY): Payer: Self-pay | Admitting: Student

## 2022-11-02 NOTE — Progress Notes (Signed)
[place] MD Outpatient Progress Note  11/02/2022 4:15 PM Curtis Mcdonald  MRN:  [id number]  Assessment:  Curtis Mcdonald presents for follow-up evaluation. Today, 11/02/22, patient reports continued remission of psychotic symptoms.  Seems to be doing well.  Complains of an intention tremor consistent with ET.  Aims of 0.  Plan to return to care in 3 months.  Identifying Information: Curtis Mcdonald is a 67 y.o. male with a history of schizophrenia and anxiety who is an established patient with [facility] participating in follow-up via video conferencing.   Plan:  # Schizophrenia  Anxiety Past medication trials: unknown Status of problem: stable Interventions: -- Continue Latuda 40 mg daily - Patient appears to have gained a significant of weight since his last visit.  Will need to monitor weight gain on Latuda (could be related to Vistaril use). -- Continue Buspar 10 mg TID -- Continue hydroxyzine 25 mg three times daily PRN anxiety  # Sleep Past medication trials: unknown Status of problem: stable Interventions: -- Continue trazodone 150 mg nightly  # Tremor, most likely essential tremor Past medication trials: none Status of problem: New problem to this provider Interventions:  -- Patient was provided with information for [facility] Wellness to establish with primary care provider  Patient was given contact information for behavioral health clinic and was instructed to call 911 for emergencies.   Subjective:  Chief Complaint:  Chief Complaint  Patient presents with   Medication Refill    Interval History:   Patient reports that he lives alone in his apartment in [place] off of retirement income.  He reports that he has no family in the area.  His speech is nonspontaneous but his thought processes linear and his answers are appropriate.  He reports coming to [place] 10 years ago to attend rehab.  He reports that he drives independently and  takes care of his ADLs and IADLs.  He denies experiencing any auditory or visual hallucinations or paranoia or ideas of reference since his last visit.  He reports that his sleep continues to be poor but he is eating appropriately and enjoys watching TV and reading the Bible.  Visit Diagnosis:    ICD-10-CM   1. Generalized anxiety disorder  F41.1 busPIRone (BUSPAR) 10 MG tablet    hydrOXYzine (ATARAX) 25 MG tablet    2. Paranoid schizophrenia ([place])  F20.0 lurasidone (LATUDA) 40 MG TABS tablet    traZODone (DESYREL) 150 MG tablet      Past Psychiatric History:  Diagnoses: schizophrenia, anxiety, cocaine abuse  Medication trials: unknown Hospitalizations: denies Suicide attempts: denies Hx of abuse: denies Substance use:   -- Denies use of cannabis, cocaine (last used in 2725), or illicit drug use  -- Etoh: denies  -- Tobacco: denies  -- Caffeine: 1 cup once a week  Past Medical History:  Past Medical History:  Diagnosis Date   Stroke [facility])     Past Surgical History:  Procedure Laterality Date   WISDOM TOOTH EXTRACTION      Family Psychiatric History: denies  Family History:  Family History  Problem Relation Age of Onset   CAD Neg Hx    Diabetes Neg Hx     Social History:  Social History   Socioeconomic History   Marital status: Single    Spouse name: Not on file   Number of children: Not on file   Years of education: Not on file   Highest education level: Not on file  Occupational History   Occupation: Atm assembly    Comment: [employer]  Tobacco Use   Smoking status: Never   Smokeless tobacco: Never  Substance and Sexual Activity   Alcohol use: No    Comment: fomer    Drug use: No   Sexual activity: Not Currently  Other Topics Concern   Not on file  Social History Narrative   Not on file   Social Determinants of Health   Financial Resource Strain: Low Risk  (03/30/2020)   Overall Financial Resource Strain (CARDIA)    Difficulty of Paying Living  Expenses: Not very hard  Food Insecurity: No Food Insecurity (03/30/2020)   Hunger Vital Sign    Worried About Running Out of Food in the Last Year: Never true    Ran Out of Food in the Last Year: Never true  Transportation Needs: No Transportation Needs (03/30/2020)   PRAPARE - Hydrologist (Medical): No    Lack of Transportation (Non-Medical): No  Physical Activity: Insufficiently Active (08/19/2020)   Exercise Vital Sign    Days of Exercise per Week: 3 days    Minutes of Exercise per Session: 30 min  Stress: No Stress Concern Present (06/30/2020)   [place]    Feeling of Stress : Only a little  Social Connections: Moderately Isolated (08/19/2020)   Social Connection and Isolation Panel [NHANES]    Frequency of Communication with Friends and Family: Once a week    Frequency of Social Gatherings with Friends and Family: Once a week    Attends Religious Services: More than 4 times per year    Active Member of [employer] or Organizations: Yes    Attends Music therapist: More than 4 times per year    Marital Status: Divorced    Allergies: No Known Allergies  Current Medications: Current Outpatient Medications  Medication Sig Dispense Refill   busPIRone (BUSPAR) 10 MG tablet Take 1 tablet (10 mg total) by mouth 3 (three) times daily. 90 tablet 2   hydrOXYzine (ATARAX) 25 MG tablet Take 1 tablet (25 mg total) by mouth 3 (three) times daily as needed for anxiety. 90 tablet 2   lurasidone (LATUDA) 40 MG TABS tablet Take 1 tablet (40 mg total) by mouth daily with breakfast. 30 tablet 2   traZODone (DESYREL) 150 MG tablet Take 1 tablet (150 mg total) by mouth at bedtime. 30 tablet 2   No current facility-administered medications for this visit.     Objective:  Psychiatric Specialty Exam: Blood pressure (!) 145/80, pulse 84, height 5\' 9"  (1.753 m), weight 182 lb (82.6 kg).Body mass  index is 26.88 kg/m.  General Appearance: Casual and Fairly Groomed  Eye Contact:  Good  Speech:  Clear and Coherent and Normal Rate  Volume:  Normal  Mood:   "good"  Affect:  Constricted and euthymic  Thought Content:  Denies AVH; IOR; paranoia    Suicidal Thoughts:  No  Homicidal Thoughts:  No  Thought Process:  Goal Directed and Linear  Orientation:  Full (Time, Place, and Person)    Memory:   Grossly intact  Judgment:  Good  Insight:  Good  Concentration:  Concentration: Good  Recall:  NA  Fund of Knowledge: Good  Language: Good  Psychomotor Activity:   Patient endorses bilateral hand tremor although not visible over video  Akathisia:  No  AIMS (if indicated): not done  Assets:  Communication Skills Desire for Improvement  Housing Leisure Time Physical Health Transportation  ADL's:  Intact  Cognition: WNL  Sleep:  Good   PE: General: sits comfortably in view of camera; no acute distress  Pulm: no increased work of breathing on room air  MSK: all extremity movements appear intact  Neuro: no focal neurological deficits observed  Gait & Station: unable to assess by video    Metabolic Disorder Labs: Lab Results  Component Value Date   HGBA1C 5.1 04/12/2016   MPG 100 04/12/2016   No results found for: "PROLACTIN" Lab Results  Component Value Date   CHOL 136 04/12/2016   TRIG 68 04/12/2016   HDL 38 (L) 04/12/2016   CHOLHDL 3.6 04/12/2016   VLDL 14 04/12/2016   LDLCALC 84 04/12/2016   LDLCALC 94 01/11/2010   Lab Results  Component Value Date   TSH 0.330 (L) 04/12/2016    Therapeutic Level Labs: No results found for: "LITHIUM" No results found for: "VALPROATE" No results found for: "CBMZ"  Screenings:  AIMS    Flowsheet Row Clinical Support from 10/06/2020 in [facility]  AIMS Total Score 0      GAD-7    Flowsheet Row Video Visit from 05/11/2022 in [facility] Office Visit from 02/01/2022  in [facility] Clinical Support from 08/23/2021 in [facility] Clinical Support from 05/22/2021 in [facility] Clinical Support from 02/20/2021 in [facility]  Total GAD-7 Score 9 6 1 4 11       PHQ2-9    Flowsheet Row Video Visit from 05/11/2022 in [facility] Office Visit from 02/01/2022 in [facility] Clinical Support from 08/23/2021 in [facility] Clinical Support from 05/22/2021 in [facility] Clinical Support from 02/20/2021 in [facility]  PHQ-2 Total Score 2 0 0 0 0  PHQ-9 Total Score 3 -- 1 0 2      Flowsheet Row ED from 05/13/2022 in [facility] Emergency Department at [facility] Office Visit from 02/01/2022 in [facility] Clinical Support from 02/20/2021 in [facility]  C-SSRS RISK CATEGORY No Risk No Risk No Risk       Collaboration of Care: Collaboration of Care: Medication Management AEB ongoing medication management, Primary Care Provider AEB patient provided with resources to establish with local PCP, and Psychiatrist AEB established with this provider   I provided 30 minutes of face-to-face time during this encounter.  [facility], MD 11/02/2022, 4:15 PM

## 2022-11-20 ENCOUNTER — Ambulatory Visit: Payer: Medicaid Other | Attending: Nurse Practitioner | Admitting: Nurse Practitioner

## 2022-11-20 ENCOUNTER — Encounter: Payer: Self-pay | Admitting: Nurse Practitioner

## 2022-11-20 ENCOUNTER — Ambulatory Visit: Payer: Commercial Managed Care - HMO | Admitting: Nurse Practitioner

## 2022-11-20 VITALS — BP 130/76 | HR 83 | Ht 67.25 in | Wt 185.0 lb

## 2022-11-20 DIAGNOSIS — Z125 Encounter for screening for malignant neoplasm of prostate: Secondary | ICD-10-CM

## 2022-11-20 DIAGNOSIS — Z23 Encounter for immunization: Secondary | ICD-10-CM

## 2022-11-20 DIAGNOSIS — M545 Low back pain, unspecified: Secondary | ICD-10-CM

## 2022-11-20 DIAGNOSIS — Z1211 Encounter for screening for malignant neoplasm of colon: Secondary | ICD-10-CM

## 2022-11-20 DIAGNOSIS — E783 Hyperchylomicronemia: Secondary | ICD-10-CM

## 2022-11-20 DIAGNOSIS — R7989 Other specified abnormal findings of blood chemistry: Secondary | ICD-10-CM

## 2022-11-20 DIAGNOSIS — R7309 Other abnormal glucose: Secondary | ICD-10-CM

## 2022-11-20 DIAGNOSIS — Z7689 Persons encountering health services in other specified circumstances: Secondary | ICD-10-CM

## 2022-11-20 DIAGNOSIS — F200 Paranoid schizophrenia: Secondary | ICD-10-CM

## 2022-11-20 DIAGNOSIS — G8929 Other chronic pain: Secondary | ICD-10-CM

## 2022-11-20 MED ORDER — MELOXICAM 7.5 MG PO TABS: 7.5000 mg | ORAL_TABLET | Freq: Every day | ORAL | 0 refills | Status: DC

## 2022-11-20 NOTE — Progress Notes (Signed)
Lower back pain off and on for 4+ years.

## 2022-11-20 NOTE — Progress Notes (Signed)
Assessment & Plan:  Curtis Mcdonald was seen today for establish care.  Diagnoses and all orders for this visit:  Encounter to establish care  Colon cancer screening -     Ambulatory referral to Gastroenterology  Chronic bilateral low back pain without sciatica -     meloxicam (MOBIC) 7.5 MG tablet; Take 1-2 tablets (7.5-15 mg total) by mouth daily. -     DG Lumbar Spine Complete; Future  Abnormal CBC -     CBC with Differential  Elevated glucose -     CMP14+EGFR  Mixed hyperglyceridemia -     Lipid panel  Prostate cancer screening -     PSA  Need for immunization against influenza -     Flu Vaccine QUAD 50moIM (Fluarix, Fluzone & Alfiuria Quad PF)  Paranoid schizophrenia ([place] Followed by behavioral health.   Patient has been counseled on age-appropriate routine health concerns for screening and prevention. These are reviewed and up-to-date. Referrals have been placed accordingly. Immunizations are up-to-date or declined.    Subjective:   Chief Complaint  Patient presents with   Establish Care   HPI Curtis [id number].o. male presents to office today to establish care  He has a past medical history of Anxiety, Paranoid schizophrenia, and Stroke with no residual deficits(2017).  Followed by behavioral health    Chronic back pain Several years (2017) on and off over the past few years. Takes ibuprofen over the counter for back pain. Back brace does not seem to provide relief of pain. Unrelated to any injury or trauma. Pain described as aching 6/10. Aggravating factors: Bending over to tie shoes. Denies sciatica symptoms or incontinence of urine or stool.  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 Had to wear back brace at that time as well.   Review of Systems  Constitutional:  Negative for fever, malaise/fatigue and weight loss.  HENT: Negative.  Negative for nosebleeds.   Eyes: Negative.   Negative for blurred vision, double vision and photophobia.  Respiratory: Negative.  Negative for cough and shortness of breath.   Cardiovascular: Negative.  Negative for chest pain, palpitations and leg swelling.  Gastrointestinal: Negative.  Negative for heartburn, nausea and vomiting.  Musculoskeletal:  Positive for back pain. Negative for myalgias.  Neurological: Negative.  Negative for dizziness, focal weakness, seizures and headaches.  Psychiatric/Behavioral: Negative.  Negative for suicidal ideas.     Past Medical History:  Diagnosis Date   Anxiety    Paranoid schizophrenia ([place]    [place]    Past Surgical History:  Procedure Laterality Date   WISDOM TOOTH EXTRACTION      Family History  Problem Relation Age of Onset   CAD Neg Hx    Diabetes Neg Hx     Social History Reviewed with no changes to be made today.   Outpatient Medications Prior to Visit  Medication Sig Dispense Refill   busPIRone (BUSPAR) 10 MG tablet Take 1 tablet (10 mg total) by mouth 3 (three) times daily. 90 tablet 2   hydrOXYzine (ATARAX) 25 MG tablet Take 1 tablet (25 mg total) by mouth 3 (three) times daily as needed for anxiety. 90 tablet 2   lurasidone (LATUDA) 40 MG TABS tablet Take 1 tablet (40 mg total) by mouth daily with breakfast. 30 tablet 2   traZODone (DESYREL) 150 MG tablet Take 1 tablet (150 mg total) by mouth at  bedtime. 30 tablet 2   No facility-administered medications prior to visit.    No Known Allergies     Objective:    BP 130/76   Pulse 83   Ht 5' 7.25" (1.708 m)   Wt 185 lb (83.9 kg)   SpO2 97%   BMI 28.76 kg/m  Wt Readings from Last 3 Encounters:  11/20/22 185 lb (83.9 kg)  05/22/16 129 lb (58.5 kg)  04/13/16 185 lb 6.5 oz (84.1 kg)    Physical Exam Vitals and nursing note reviewed.  Constitutional:      Appearance: He is well-developed.  HENT:     Head: Normocephalic and atraumatic.  Cardiovascular:     Rate and Rhythm: Normal rate and  regular rhythm.     Heart sounds: Normal heart sounds. No murmur heard.    No friction rub. No gallop.  Pulmonary:     Effort: Pulmonary effort is normal. No tachypnea or respiratory distress.     Breath sounds: Normal breath sounds. No decreased breath sounds, wheezing, rhonchi or rales.  Chest:     Chest wall: No tenderness.  Abdominal:     General: Bowel sounds are normal.     Palpations: Abdomen is soft.  Musculoskeletal:        General: Normal range of motion.     Cervical back: Normal range of motion.  Skin:    General: Skin is warm and dry.  Neurological:     Mental Status: He is alert and oriented to person, place, and time.     Coordination: Coordination normal.  Psychiatric:        Behavior: Behavior normal. Behavior is cooperative.        Thought Content: Thought content normal.        Judgment: Judgment normal.          Patient has been counseled extensively about nutrition and exercise as well as the importance of adherence with medications and regular follow-up. The patient was given clear instructions to go to ER or return to medical center if symptoms don't improve, worsen or new problems develop. The patient verbalized understanding.   Follow-up: Return in about 6 weeks (around 01/01/2023) for f/u back pain.   Gildardo Pounds, FNP-BC [facility] [place], [place]   11/20/2022, 9:35 AM

## 2022-11-21 ENCOUNTER — Ambulatory Visit
Admission: RE | Admit: 2022-11-21 | Discharge: 2022-11-21 | Disposition: A | Discharge status: Home / Self Care | Payer: Medicaid Other | Source: Ambulatory Visit | Attending: Nurse Practitioner | Admitting: Nurse Practitioner

## 2022-11-21 DIAGNOSIS — M545 Low back pain, unspecified: Secondary | ICD-10-CM

## 2022-11-22 LAB — CBC WITH DIFFERENTIAL/PLATELET
Basophils Absolute: 0 10*3/uL (ref 0.0–0.2)
Basos: 1 %
EOS (ABSOLUTE): 0.1 10*3/uL (ref 0.0–0.4)
Eos: 3 %
Hematocrit: 35.2 % — ABNORMAL LOW (ref 37.5–51.0)
Hemoglobin: 12.7 g/dL — ABNORMAL LOW (ref 13.0–17.7)
Immature Grans (Abs): 0 10*3/uL (ref 0.0–0.1)
Immature Granulocytes: 0 %
Lymphocytes Absolute: 2.5 10*3/uL (ref 0.7–3.1)
Lymphs: 46 %
MCH: 32.1 pg (ref 26.6–33.0)
MCHC: 36.1 g/dL — ABNORMAL HIGH (ref 31.5–35.7)
MCV: 89 fL (ref 79–97)
Monocytes Absolute: 0.5 10*3/uL (ref 0.1–0.9)
Monocytes: 10 %
Neutrophils Absolute: 2.1 10*3/uL (ref 1.4–7.0)
Neutrophils: 40 %
Platelets: 101 10*3/uL — ABNORMAL LOW (ref 150–450)
RBC: 3.96 x10E6/uL — ABNORMAL LOW (ref 4.14–5.80)
RDW: 14.5 % (ref 11.6–15.4)
WBC: 5.4 10*3/uL (ref 3.4–10.8)

## 2022-11-22 LAB — CMP14+EGFR
ALT: 50 IU/L — ABNORMAL HIGH (ref 0–44)
AST: 54 IU/L — ABNORMAL HIGH (ref 0–40)
Albumin/Globulin Ratio: 0.7 — ABNORMAL LOW (ref 1.2–2.2)
Albumin: 2.8 g/dL — ABNORMAL LOW (ref 3.9–4.9)
Alkaline Phosphatase: 202 IU/L — ABNORMAL HIGH (ref 44–121)
BUN/Creatinine Ratio: 5 — ABNORMAL LOW (ref 10–24)
BUN: 5 mg/dL — ABNORMAL LOW (ref 8–27)
Bilirubin Total: 1.4 mg/dL — ABNORMAL HIGH (ref 0.0–1.2)
CO2: 19 mmol/L — ABNORMAL LOW (ref 20–29)
Calcium: 8.2 mg/dL — ABNORMAL LOW (ref 8.6–10.2)
Chloride: 102 mmol/L (ref 96–106)
Creatinine, Ser: 0.97 mg/dL (ref 0.76–1.27)
Globulin, Total: 4.1 g/dL (ref 1.5–4.5)
Glucose: 85 mg/dL (ref 70–99)
Potassium: 4.8 mmol/L (ref 3.5–5.2)
Sodium: 132 mmol/L — ABNORMAL LOW (ref 134–144)
Total Protein: 6.9 g/dL (ref 6.0–8.5)
eGFR: 87 mL/min/{1.73_m2} (ref >59)

## 2022-11-22 LAB — LIPID PANEL
Chol/HDL Ratio: 3.3 ratio (ref 0.0–5.0)
Cholesterol, Total: 110 mg/dL (ref 100–199)
HDL: 33 mg/dL — ABNORMAL LOW (ref >39)
LDL Chol Calc (NIH): 60 mg/dL (ref 0–99)
Triglycerides: 82 mg/dL (ref 0–149)
VLDL Cholesterol Cal: 17 mg/dL (ref 5–40)

## 2022-11-22 LAB — PSA: Prostate Specific Ag, Serum: 5.1 ng/mL — ABNORMAL HIGH (ref 0.0–4.0)

## 2022-11-25 ENCOUNTER — Other Ambulatory Visit: Payer: Self-pay | Admitting: Nurse Practitioner

## 2022-11-25 DIAGNOSIS — R748 Abnormal levels of other serum enzymes: Secondary | ICD-10-CM

## 2022-11-29 ENCOUNTER — Telehealth: Payer: Self-pay

## 2022-11-29 NOTE — Telephone Encounter (Signed)
Pt given lab results per notes of Zelda, NP from 11/20/22 on 11/29/22. Pt verbalized understanding. Pt was provided with # for [facility] to schedule appt. No further assistance needed.

## 2022-12-19 ENCOUNTER — Other Ambulatory Visit: Payer: Self-pay | Admitting: Nurse Practitioner

## 2022-12-19 DIAGNOSIS — G8929 Other chronic pain: Secondary | ICD-10-CM

## 2022-12-19 MED ORDER — MELOXICAM 7.5 MG PO TABS: 7.5000 mg | ORAL_TABLET | Freq: Every day | ORAL | 0 refills | Status: DC

## 2023-01-02 ENCOUNTER — Ambulatory Visit: Payer: Self-pay | Admitting: *Deleted

## 2023-01-02 ENCOUNTER — Encounter: Payer: Self-pay | Admitting: Nurse Practitioner

## 2023-01-02 ENCOUNTER — Ambulatory Visit: Payer: Commercial Managed Care - HMO | Attending: Nurse Practitioner | Admitting: Nurse Practitioner

## 2023-01-02 VITALS — BP 138/78 | HR 86 | Ht 67.25 in | Wt 187.0 lb

## 2023-01-02 DIAGNOSIS — M545 Low back pain, unspecified: Secondary | ICD-10-CM | POA: Diagnosis not present

## 2023-01-02 DIAGNOSIS — R748 Abnormal levels of other serum enzymes: Secondary | ICD-10-CM

## 2023-01-02 DIAGNOSIS — Z23 Encounter for immunization: Secondary | ICD-10-CM | POA: Diagnosis not present

## 2023-01-02 DIAGNOSIS — F200 Paranoid schizophrenia: Secondary | ICD-10-CM

## 2023-01-02 MED ORDER — LUMBAR BACK BRACE/SUPPORT PAD MISC: 1.0000 | Freq: Every day | 0 refills | Status: DC

## 2023-01-02 MED ORDER — TRAZODONE HCL 300 MG PO TABS: 300.0000 mg | ORAL_TABLET | Freq: Every evening | ORAL | 2 refills | Status: DC

## 2023-01-02 MED ORDER — LUMBAR BACK BRACE/SUPPORT PAD MISC: 1.0000 | Freq: Every day | 0 refills | Status: AC

## 2023-01-02 NOTE — Progress Notes (Signed)
Back pain still present.  Order for back brace.

## 2023-01-02 NOTE — Progress Notes (Addendum)
Assessment & Plan:  [name] was seen today for back pain.  Diagnoses and all orders for this visit:  Lumbar back pain -     Ambulatory referral to Physical Therapy If unsuccessful with need to see Ortho May alternate with heat and ice application for pain relief. May also alternate with acetaminophen as prescribed for back pain. Other alternatives include massage, acupuncture and water aerobics.   -Ordering an LSO to reduce pain by restricting mobility of the trunk     Paranoid schizophrenia (HCC) DOSE CHANGE-     traZODone (DESYREL) 300 MG tablet; Take 1 tablet (300 mg total) by mouth at bedtime.    Patient has been counseled on age-appropriate routine health concerns for screening and prevention. These are reviewed and up-to-date. Referrals have been placed accordingly. Immunizations are up-to-date or declined.    Subjective:   Chief Complaint  Patient presents with   Back Pain   Back Pain Pertinent negatives include no chest pain, fever, headaches or weight loss.   Curtis Mcdonald 67 y.o. male presents to office today for follow back pain.  He has a past medical history of Anxiety, Paranoid schizophrenia, and Stroke with no residual deficits(2017).  Followed by behavioral health  Does not feel Trazodone is working.   Chronic back pain Several years (2017) on and off over the past few years. Takes ibuprofen over the counter for back pain. His current back brace that he purchased over the counter does not seem to provide relief of pain. Unrelated to any injury or trauma. Pain described as aching and tightness 6/10. Aggravating factors: Bending over to tie shoes. Denies sciatica symptoms or incontinence of urine or stool.  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 Had to wear back brace at that time as well.    Review of Systems  Constitutional:  Negative for fever, malaise/fatigue and weight loss.   HENT: Negative.  Negative for nosebleeds.   Eyes: Negative.  Negative for blurred vision, double vision and photophobia.  Respiratory: Negative.  Negative for cough and shortness of breath.   Cardiovascular: Negative.  Negative for chest pain, palpitations and leg swelling.  Gastrointestinal: Negative.  Negative for heartburn, nausea and vomiting.  Musculoskeletal:  Positive for back pain. Negative for myalgias.  Neurological: Negative.  Negative for dizziness, focal weakness, seizures and headaches.  Psychiatric/Behavioral:  Negative for suicidal ideas. The patient has insomnia.     Past Medical History:  Diagnosis Date   Anxiety    Paranoid schizophrenia (HCC)    Stroke (HCC) 2017    Past Surgical History:  Procedure Laterality Date   WISDOM TOOTH EXTRACTION      Family History  Problem Relation Age of Onset   CAD Neg Hx    Diabetes Neg Hx     Social History Reviewed with no changes to be made today.   Outpatient Medications Prior to Visit  Medication Sig Dispense Refill   busPIRone (BUSPAR) 10 MG tablet Take 1 tablet (10 mg total) by mouth 3 (three) times daily. 90 tablet 2   hydrOXYzine (ATARAX) 25 MG tablet Take 1 tablet (25 mg total) by mouth 3 (three) times daily as needed for anxiety. 90 tablet 2   lurasidone (LATUDA) 40 MG TABS tablet Take 1 tablet (40 mg total) by mouth daily with breakfast. 30 tablet 2   meloxicam (MOBIC) 7.5 MG tablet Take 1-2 tablets (7.5-15 mg total)  by mouth daily. 60 tablet 0   traZODone (DESYREL) 150 MG tablet Take 1 tablet (150 mg total) by mouth at bedtime. (Patient not taking: Reported on 01/02/2023) 30 tablet 2   No facility-administered medications prior to visit.    No Known Allergies     Objective:    BP 138/78   Pulse 86   Ht 5' 7.25" (1.708 m)   Wt 187 lb (84.8 kg)   SpO2 96%   BMI 29.07 kg/m  Wt Readings from Last 3 Encounters:  01/02/23 187 lb (84.8 kg)  11/20/22 185 lb (83.9 kg)  05/22/16 129 lb (58.5 kg)     Physical Exam Vitals and nursing note reviewed.  Constitutional:      Appearance: He is well-developed.  HENT:     Head: Normocephalic and atraumatic.  Cardiovascular:     Rate and Rhythm: Normal rate and regular rhythm.     Heart sounds: Normal heart sounds. No murmur heard.    No friction rub. No gallop.  Pulmonary:     Effort: Pulmonary effort is normal. No tachypnea or respiratory distress.     Breath sounds: Normal breath sounds. No decreased breath sounds, wheezing, rhonchi or rales.  Chest:     Chest wall: No tenderness.  Abdominal:     General: Bowel sounds are normal.     Palpations: Abdomen is soft.  Musculoskeletal:        General: Normal range of motion.     Cervical back: Normal range of motion.  Skin:    General: Skin is warm and dry.  Neurological:     Mental Status: He is alert and oriented to person, place, and time.     Coordination: Coordination normal.  Psychiatric:        Behavior: Behavior normal. Behavior is cooperative.        Thought Content: Thought content normal.        Judgment: Judgment normal.          Patient has been counseled extensively about nutrition and exercise as well as the importance of adherence with medications and regular follow-up. The patient was given clear instructions to go to ER or return to medical center if symptoms don't improve, worsen or new problems develop. The patient verbalized understanding.   Follow-up: Return in about 3 months (around 04/04/2023).   Claiborne Rigg, FNP-BC [facility] and [facility] [place], [place] [phone]   01/02/2023, 9:25 AM

## 2023-01-02 NOTE — Telephone Encounter (Signed)
Summary: Pt has questions about the Rx for Elastic Bandages & Supports   Pt has questions about the Rx for Elastic Bandages & Supports (LUMBAR BACK BRACE/SUPPORT PAD) MISC. Cb# [phone]      Called patient to review request. Last seen today in [place]. Patient requesting PCP to order elastic bandages for legs. Requesting replacement pads for back brace. Patient bought back brace OTC. Recommended patient go back where he bought back brace to see if replacement pads sold. Otherwise, he would need to remove the soiled pads from back brace and hand wash and allow to dry and replace. In review of OV, PCP is ordering new back brace that may help with pain since OTC back brace not helping with pain. Please advise how long brace will take to come in.   Reason for Disposition  [1] Caller requesting NON-URGENT health information AND [2] PCP's office is the best resource  Answer Assessment - Initial Assessment Questions 1. REASON FOR CALL or QUESTION: "What is your reason for calling today?" or "How can I best help you?" or "What question do you have that I can help answer?"     Patient requesting PCP to order elastic bandages for legs and new pads for OTC back brace.  Protocols used: Information Only Call - No Triage-A-AH

## 2023-01-03 LAB — CMP14+EGFR
ALT: 57 IU/L — ABNORMAL HIGH (ref 0–44)
AST: 69 IU/L — ABNORMAL HIGH (ref 0–40)
Albumin/Globulin Ratio: 0.8 — ABNORMAL LOW (ref 1.2–2.2)
Albumin: 3.1 g/dL — ABNORMAL LOW (ref 3.9–4.9)
Alkaline Phosphatase: 199 IU/L — ABNORMAL HIGH (ref 44–121)
BUN/Creatinine Ratio: 9 — ABNORMAL LOW (ref 10–24)
BUN: 8 mg/dL (ref 8–27)
Bilirubin Total: 1.1 mg/dL (ref 0.0–1.2)
CO2: 20 mmol/L (ref 20–29)
Calcium: 8.3 mg/dL — ABNORMAL LOW (ref 8.6–10.2)
Chloride: 102 mmol/L (ref 96–106)
Creatinine, Ser: 0.85 mg/dL (ref 0.76–1.27)
Globulin, Total: 3.9 g/dL (ref 1.5–4.5)
Glucose: 83 mg/dL (ref 70–99)
Potassium: 3.6 mmol/L (ref 3.5–5.2)
Sodium: 135 mmol/L (ref 134–144)
Total Protein: 7 g/dL (ref 6.0–8.5)
eGFR: 97 mL/min/{1.73_m2} (ref >59)

## 2023-01-04 ENCOUNTER — Other Ambulatory Visit: Payer: Self-pay | Admitting: Nurse Practitioner

## 2023-01-04 DIAGNOSIS — R748 Abnormal levels of other serum enzymes: Secondary | ICD-10-CM

## 2023-01-07 ENCOUNTER — Ambulatory Visit (AMBULATORY_SURGERY_CENTER): Payer: Commercial Managed Care - HMO

## 2023-01-07 VITALS — Ht 69.0 in | Wt 187.0 lb

## 2023-01-07 DIAGNOSIS — Z1211 Encounter for screening for malignant neoplasm of colon: Secondary | ICD-10-CM

## 2023-01-07 MED ORDER — PEG 3350-KCL-NA BICARB-NACL 420 G PO SOLR: 4000.0000 mL | Freq: Once | ORAL | 0 refills | Status: AC

## 2023-01-07 NOTE — Progress Notes (Signed)

## 2023-01-08 ENCOUNTER — Telehealth: Payer: Self-pay | Admitting: *Deleted

## 2023-01-08 NOTE — Telephone Encounter (Signed)
noted 

## 2023-01-08 NOTE — Telephone Encounter (Signed)
  Chief Complaint: Results Symptoms: NA Frequency: NA Pertinent Negatives: Patient denies NA Disposition: [] ED /[] Urgent Care (no appt availability in office) / [] Appointment(In office/virtual)/ []  [place] [facility]/ [] Home Care/ [] Refused Recommended Disposition /[] [place] [facility]/ []  Follow-up with PCP Additional Notes: Calling for results, reviewed, pt verbalizess understanding.    Pt states he does not drink alcohol. Lab appt secured for 01/11/24   "Calcium is low. Need to check vitamin D first. If normal will need to send to endocrinology for work up of low calcium Stop by lab at earliest convenience.   Liver enzymes are elevated. Does he drink alcohol? If so how often? Diet should be low in fat and cholesterol. Will order Hep C. If he does not drink we need to order abd [place] to look at liver."

## 2023-01-09 NOTE — Telephone Encounter (Signed)
FYI

## 2023-01-11 ENCOUNTER — Other Ambulatory Visit: Payer: Commercial Managed Care - HMO

## 2023-01-12 NOTE — Telephone Encounter (Signed)
He did not show for labwork

## 2023-01-14 ENCOUNTER — Ambulatory Visit: Payer: Commercial Managed Care - HMO | Attending: Nurse Practitioner

## 2023-01-14 DIAGNOSIS — R748 Abnormal levels of other serum enzymes: Secondary | ICD-10-CM

## 2023-01-14 NOTE — Therapy (Signed)
OUTPATIENT PHYSICAL THERAPY THORACOLUMBAR EVALUATION   Patient Name: Curtis Mcdonald MRN: [id number] DOB:[birth year], 67 y.o., male Today's Date: 01/15/2023  END OF SESSION:  PT End of Session - 01/15/23 1144     Visit Number 1    Number of Visits 7    Date for PT Re-Evaluation 03/12/23    Authorization Type [employer]/MCD secondary    Authorization Time Period auth tbd    PT Start Time 1145    PT Stop Time 1229    PT Time Calculation (min) 44 min    Activity Tolerance Patient tolerated treatment well;No increased pain    Behavior During Therapy [facility] for tasks assessed/performed             Past Medical History:  Diagnosis Date   Anxiety    Paranoid schizophrenia    Stroke 2017   Past Surgical History:  Procedure Laterality Date   COLONOSCOPY  2013   WISDOM TOOTH EXTRACTION     Patient Active Problem List   Diagnosis Date Noted   Schizophrenia, unspecified 03/02/2020   Generalized anxiety disorder 02/22/2020   LOC (loss of consciousness)    Intracranial hemorrhage 04/11/2016   Nasal bone fracture 04/11/2016   Syncope 04/11/2016   Syncope and collapse 04/11/2016   Lumbar back pain 04/07/2012    PCP: Claiborne Rigg, NP  REFERRING PROVIDER: Claiborne Rigg, NP  REFERRING DIAG: M54.50 (ICD-10-CM) - Lumbar back pain  Rationale for Evaluation and Treatment: Rehabilitation  THERAPY DIAG:  Other low back pain  Muscle weakness (generalized)  ONSET DATE: since 2017, recent worsening ~6 months ago   SUBJECTIVE:                                                                                                                                                                                           SUBJECTIVE STATEMENT: Pt states his back pain started in 2017, no MOI, gradual onset prior to stroke. States pain went away for a while, but came back about 6 months ago. States he has started wearing back brace with some relief. Cannot recall any MOI or change in activity. No  LE pain, no N/T in LE. Pain is typically transient and only bothers him during exacerbating activities, resolves immediately with change in positioning.  Red flag questioning reassuring   PERTINENT HISTORY:  ICH, syncope, schizophrenia, anxiety (pt states these are all well controlled)  PAIN:  Are you having pain: none Location/description: low back, starting midline back moving laterally BIL Best-worst over past week: 0-8/10, eases quickly   - aggravating factors: bending, lower body dressing  - Easing factors: brace, rest/changing positioning  PRECAUTIONS: hx ICH/syncope   WEIGHT BEARING RESTRICTIONS: No  FALLS:  Has patient fallen in last 6 months? No - most recent about a year ago, passed out; denies balance issues  LIVING ENVIRONMENT: Apartment, no stairs. Lives alone  OCCUPATION: retired - Research officer, [employer]   PLOF: Independent  PATIENT GOALS: reduce pain levels  NEXT MD VISIT: June 2024  OBJECTIVE:   DIAGNOSTIC FINDINGS:  11/24/22 Lumbar XR: IMPRESSION: Minimal degenerative endplate changes in the lumbar spine.  PATIENT SURVEYS:  FOTO 63 current, 68 predicted  SCREENING FOR RED FLAGS: Red flag questioning/screening reassuring    COGNITION: Overall cognitive status: Within functional limits for tasks assessed     SENSATION: WFL BIL LE   POSTURE: forward head, increased lordosis  PALPATION: Concordant tenderness BIL distal QL and SIJ, unable to assess further as pt has back brace donned on arrival  LUMBAR ROM:   AROM eval  Flexion 100% * (touches ankles)  Extension 50%  Right lateral flexion Lateral knee *   Left lateral flexion Lateral knee *  Right rotation 25% s  Left rotation 25% s   (Blank rows = not tested) (Key: WFL = within functional limits not formally assessed, * = concordant pain, s = stiffness/stretching sensation, NT = not tested)   LOWER EXTREMITY ROM:     Active  Right eval Left eval  Hip flexion    Hip extension     Hip internal rotation    Hip external rotation    Knee extension    Knee flexion    (Blank rows = not tested) (Key: WFL = within functional limits not formally assessed, * = concordant pain, s = stiffness/stretching sensation, NT = not tested)  Comments:    LOWER EXTREMITY MMT:    MMT Right eval Left eval  Hip flexion 4+ 4-  Hip abduction (modified sitting) 5 5  Hip internal rotation    Hip external rotation    Knee flexion 4+ 4+  Knee extension 4+ 4  Ankle dorsiflexion 5 5   (Blank rows = not tested) (Key: WFL = within functional limits not formally assessed, * = concordant pain, s = stiffness/stretching sensation, NT = not tested)  Comments: all MMT painless    FUNCTIONAL TESTS:  5xSTS: 13.12sec gentle UE support, no increase in pain  GAIT: Distance walked: within clinic Assistive device utilized: None Level of assistance: Complete Independence Comments: widened BOS, reduced gait speed/cadence  TODAY'S TREATMENT:                                                                                                                              [facility] Adult PT Treatment:                                                DATE: 01/15/23 Therapeutic Exercise:  Seated trunk rotation x10 BIL Seated trunk ext x5 (discontinued due to increase in pain) HEP handout + education   PATIENT EDUCATION:  Education details: Pt education on PT impairments, prognosis, and POC. Informed consent. Rationale for interventions, safe/appropriate HEP performance Person educated: Patient Education method: Explanation, Demonstration, Tactile cues, Verbal cues, and Handouts Education comprehension: verbalized understanding, returned demonstration, verbal cues required, tactile cues required, and needs further education    HOME EXERCISE PROGRAM: Access Code: [email or web address] URL: [email or web address] Date: 01/15/2023 Prepared by: Fransisco Hertzavid Zuleika Gallus  Exercises - Seated Trunk Rotation - Arms Crossed  -  1 x daily - 7 x weekly - 2 sets - 10 reps  ASSESSMENT:  CLINICAL IMPRESSION: Patient is a 67 y.o. gentleman who was seen today for physical therapy evaluation and treatment for chronic low back pain. Pt reports pain initially occurred in 2017, resolved but returned about 6 months ago without any recalled change in activity or MOI. Pt reports his primary limitation is transient pain with flexion, increasing pain with dressing/ADLs. On exam pt demos good ROM for flexion although this does provoke concordant pain, no pain with extension or rotation but does have ROM limitations. LE strength good overall but is limited on L more so than R (he denies any post CVA deficits). Pt tolerates rotational HEP well, although with trial of seated extension exercises pt demos gradual increase in resting pain, subsequently discontinued which resolves pain. Reports good understanding of modified HEP as above. No adverse events, denies any resting pain on departure. Recommend skilled PT to address relevant deficits with aim of maximizing functional tolerance for ADLs. Pt departs today's session in no acute distress, all voiced questions/concerns addressed appropriately from PT perspective.     OBJECTIVE IMPAIRMENTS: decreased activity tolerance, decreased mobility, decreased ROM, decreased strength, improper body mechanics, and pain.   ACTIVITY LIMITATIONS: bending, squatting, and dressing  PARTICIPATION LIMITATIONS: cleaning and laundry  PERSONAL FACTORS: Time since onset of injury/illness/exacerbation and 3+ comorbidities: anxiety/schizophrenia, hx CVA and syncope  are also affecting patient's functional outcome.   REHAB POTENTIAL: Good  CLINICAL DECISION MAKING: Stable/uncomplicated  EVALUATION COMPLEXITY: Low   GOALS: Goals reviewed with patient? No  SHORT TERM GOALS: Target date: 02/05/2023 Pt will demonstrate appropriate understanding and performance of initially prescribed HEP in order to facilitate  improved independence with management of symptoms.  Baseline: HEP provided on eval Goal status: INITIAL    2.  Pt will demonstrate at least 50% normal lumbar rotation AROM in order to demonstrate improved tolerance to functional movement patterns.  Baseline: see MMT chart above Goal status: INITIAL   LONG TERM GOALS: Target date: 02/26/2023 Pt will score 68 on FOTO in order to demonstrate improved perception of functional status due to symptoms.  Baseline: 63 Goal status: INITIAL  2.  Pt will demonstrate at least 75% normal lumbar rotation AROM in order to demonstrate improved tolerance to functional movement patterns.  Baseline: see MMT chart above Goal status: INITIAL  3.  Pt will demonstrate symmetrical hip flexion MMT in order to demonstrate improved strength for functional movements.  Baseline: see MMT chart above Goal status: INITIAL  4. Pt will perform 5xSTS in <11 sec in order to demonstrate reduced fall risk and improved functional independence. (MCID of 2.3sec)  Baseline: 13 sec gentle UE support  Goal status: INITIAL   5. Pt will demonstrate appropriate performance of final prescribed HEP in order to facilitate improved self-management of symptoms post-discharge.   Baseline: initial HEP prescribed  Goal status: INITIAL  PLAN:  PT FREQUENCY: 1x/week  PT DURATION: 6 weeks  PLANNED INTERVENTIONS: Therapeutic exercises, Therapeutic activity, Neuromuscular re-education, Balance training, Gait training, Patient/Family education, Self Care, Joint mobilization, Stair training, DME instructions, Aquatic Therapy, Dry Needling, Electrical stimulation, Spinal mobilization, Cryotherapy, Moist heat, Taping, Manual therapy, and Re-evaluation.  PLAN FOR NEXT SESSION: review/update HEP PRN. Progress lumbar mobility as able/appropriate, work on lumbopelvic stability, LE strengthening. Gradually build more tolerance to flexion   Ashley Murrain, PT 01/15/2023, 1:07 PM

## 2023-01-15 ENCOUNTER — Other Ambulatory Visit: Payer: Self-pay

## 2023-01-15 ENCOUNTER — Encounter: Payer: Self-pay | Admitting: Physical Therapy

## 2023-01-15 ENCOUNTER — Ambulatory Visit: Payer: Commercial Managed Care - HMO | Attending: Nurse Practitioner | Admitting: Physical Therapy

## 2023-01-15 DIAGNOSIS — M545 Low back pain, unspecified: Secondary | ICD-10-CM | POA: Insufficient documentation

## 2023-01-15 DIAGNOSIS — M6281 Muscle weakness (generalized): Secondary | ICD-10-CM | POA: Diagnosis present

## 2023-01-15 DIAGNOSIS — M5459 Other low back pain: Secondary | ICD-10-CM | POA: Diagnosis present

## 2023-01-15 LAB — HCV RT-PCR, QUANT (NON-GRAPH)

## 2023-01-16 ENCOUNTER — Other Ambulatory Visit: Payer: Self-pay | Admitting: Nurse Practitioner

## 2023-01-16 DIAGNOSIS — B1920 Unspecified viral hepatitis C without hepatic coma: Secondary | ICD-10-CM

## 2023-01-16 DIAGNOSIS — E559 Vitamin D deficiency, unspecified: Secondary | ICD-10-CM

## 2023-01-16 LAB — HCV RT-PCR, QUANT (NON-GRAPH): HCV log10: 5.607 log10 IU/mL

## 2023-01-16 LAB — VITAMIN D 25 HYDROXY (VIT D DEFICIENCY, FRACTURES): Vit D, 25-Hydroxy: 5.2 ng/mL — ABNORMAL LOW (ref 30.0–100.0)

## 2023-01-16 LAB — HCV AB W REFLEX TO QUANT PCR: HCV Ab: REACTIVE — AB

## 2023-01-16 MED ORDER — VITAMIN D (ERGOCALCIFEROL) 1.25 MG (50000 UNIT) PO CAPS
50000.0000 [IU] | ORAL_CAPSULE | ORAL | 0 refills | Status: DC
Start: 2023-01-16 — End: 2023-02-05

## 2023-01-18 ENCOUNTER — Telehealth: Payer: Self-pay

## 2023-01-18 NOTE — Telephone Encounter (Signed)
Pt given lab results per notes of Zelda, NP on 01/18/23. Pt verbalized understanding.   Claiborne Rigg, NP 01/16/2023  6:30 PM EDT  Positive for hepatitis C. Needs to be referred to ID clinic. They will call him or leave a message to schedule.   Vitamin D is low. Will send script for weekly vitamin D that you will take for 3 months. After that you can purchase Vitamin D over the counter 2000 units daily.

## 2023-01-23 ENCOUNTER — Encounter: Payer: Self-pay | Admitting: Physical Therapy

## 2023-01-24 ENCOUNTER — Ambulatory Visit (INDEPENDENT_AMBULATORY_CARE_PROVIDER_SITE_OTHER): Payer: Commercial Managed Care - HMO | Admitting: Student

## 2023-01-24 DIAGNOSIS — F200 Paranoid schizophrenia: Secondary | ICD-10-CM

## 2023-01-24 DIAGNOSIS — F411 Generalized anxiety disorder: Secondary | ICD-10-CM | POA: Diagnosis not present

## 2023-01-25 ENCOUNTER — Encounter: Payer: Self-pay | Admitting: Physical Therapy

## 2023-01-25 ENCOUNTER — Encounter (HOSPITAL_COMMUNITY): Payer: Self-pay | Admitting: Student

## 2023-01-25 ENCOUNTER — Ambulatory Visit: Payer: Commercial Managed Care - HMO | Admitting: Physical Therapy

## 2023-01-25 DIAGNOSIS — M6281 Muscle weakness (generalized): Secondary | ICD-10-CM

## 2023-01-25 DIAGNOSIS — M5459 Other low back pain: Secondary | ICD-10-CM | POA: Diagnosis not present

## 2023-01-25 MED ORDER — HYDROXYZINE HCL 25 MG PO TABS: 25.0000 mg | ORAL_TABLET | Freq: Every day | ORAL | 1 refills | Status: DC

## 2023-01-25 MED ORDER — BUSPIRONE HCL 10 MG PO TABS
10.0000 mg | ORAL_TABLET | Freq: Three times a day (TID) | ORAL | 1 refills | Status: DC
Start: 2023-01-25 — End: 2023-02-21

## 2023-01-25 MED ORDER — LURASIDONE HCL 40 MG PO TABS
40.0000 mg | ORAL_TABLET | Freq: Every day | ORAL | 1 refills | Status: DC
Start: 2023-01-25 — End: 2023-02-21

## 2023-01-25 NOTE — Therapy (Signed)
OUTPATIENT PHYSICAL THERAPY TREATMENT NOTE   Patient Name: Curtis Mcdonald MRN: [id number] DOB:[birth year], 67 y.o., male Today's Date: 01/25/2023  PCP: Claiborne Rigg, NP   REFERRING PROVIDER: Claiborne Rigg, NP    END OF SESSION:   PT End of Session - 01/25/23 0930     Visit Number 2    Number of Visits 7    Date for PT Re-Evaluation 03/12/23    Authorization Type [employer]/MCD secondary    Authorization Time Period auth tbd    PT Start Time 0930    PT Stop Time 1008    PT Time Calculation (min) 38 min             Past Medical History:  Diagnosis Date   Anxiety    Paranoid schizophrenia    Stroke 2017   Past Surgical History:  Procedure Laterality Date   COLONOSCOPY  2013   WISDOM TOOTH EXTRACTION     Patient Active Problem List   Diagnosis Date Noted   Schizophrenia, unspecified 03/02/2020   Generalized anxiety disorder 02/22/2020   LOC (loss of consciousness)    Intracranial hemorrhage 04/11/2016   Nasal bone fracture 04/11/2016   Syncope 04/11/2016   Syncope and collapse 04/11/2016   Lumbar back pain 04/07/2012    REFERRING DIAG: M54.50 (ICD-10-CM) - Lumbar back pain  THERAPY DIAG:  Other low back pain  Muscle weakness (generalized)  Rationale for Evaluation and Treatment Rehabilitation  PERTINENT HISTORY:  ICH, syncope, schizophrenia, anxiety (pt states these are all well controlled) PRECAUTIONS: hx ICH/syncope   SUBJECTIVE:                                                                                                                                                                                      SUBJECTIVE STATEMENT:  The back is a little better. I am doing the exercise when I remember. I think it is helping. Pain is 4/10 today in low back.    PAIN:  Are you having pain: 4/10 Location/description: low back, starting midline back moving laterally BIL Best-worst over past week: 0-8/10, eases quickly   - aggravating factors: bending,  lower body dressing  - Easing factors: brace, rest/changing positioning   OBJECTIVE: (objective measures completed at initial evaluation unless otherwise dated)   DIAGNOSTIC FINDINGS:  11/24/22 Lumbar XR: IMPRESSION: Minimal degenerative endplate changes in the lumbar spine.   PATIENT SURVEYS:  FOTO 63 current, 68 predicted   SCREENING FOR RED FLAGS: Red flag questioning/screening reassuring     COGNITION: Overall cognitive status: Within functional limits for tasks assessed  SENSATION: WFL BIL LE     POSTURE: forward head, increased lordosis   PALPATION: Concordant tenderness BIL distal QL and SIJ, unable to assess further as pt has back brace donned on arrival   LUMBAR ROM:    AROM eval  Flexion 100% * (touches ankles)  Extension 50%  Right lateral flexion Lateral knee *    Left lateral flexion Lateral knee *  Right rotation 25% s  Left rotation 25% s   (Blank rows = not tested) (Key: WFL = within functional limits not formally assessed, * = concordant pain, s = stiffness/stretching sensation, NT = not tested)    LOWER EXTREMITY ROM:      Active  Right eval Left eval  Hip flexion      Hip extension      Hip internal rotation      Hip external rotation      Knee extension      Knee flexion      (Blank rows = not tested) (Key: WFL = within functional limits not formally assessed, * = concordant pain, s = stiffness/stretching sensation, NT = not tested)  Comments:     LOWER EXTREMITY MMT:     MMT Right eval Left eval  Hip flexion 4+ 4-  Hip abduction (modified sitting) 5 5  Hip internal rotation      Hip external rotation      Knee flexion 4+ 4+  Knee extension 4+ 4  Ankle dorsiflexion 5 5    (Blank rows = not tested) (Key: WFL = within functional limits not formally assessed, * = concordant pain, s = stiffness/stretching sensation, NT = not tested)  Comments: all MMT painless      FUNCTIONAL TESTS:  5xSTS: 13.12sec  gentle UE support, no increase in pain   GAIT: Distance walked: within clinic Assistive device utilized: None Level of assistance: Complete Independence Comments: widened BOS, reduced gait speed/cadence   TODAY'S TREATMENT:                                                                                                                              [facility] Adult PT Treatment:                                                DATE: 01/25/23 Therapeutic Exercise: Nustep L4 UE/LE x 5 minutes  Seated trunk rotation x 10 Supine LTR within comfortable ROM SKTC  10 sec x 4 each  PPT 3 sec 10 x 2  Side hip abduction x 10 each  SLR x 10 each with ab brace  Supine clam , green band with ab brace x 10 Hooklying ball squeeze with ab brace  x 10    [facility] Adult PT Treatment:  DATE: 01/15/23 Therapeutic Exercise: Seated trunk rotation x10 BIL Seated trunk ext x5 (discontinued due to increase in pain) HEP handout + education     PATIENT EDUCATION:  Education details: Pt education on PT impairments, prognosis, and POC. Informed consent. Rationale for interventions, safe/appropriate HEP performance Person educated: Patient Education method: Explanation, Demonstration, Tactile cues, Verbal cues, and Handouts Education comprehension: verbalized understanding, returned demonstration, verbal cues required, tactile cues required, and needs further education     HOME EXERCISE PROGRAM: Access Code: [email or web address] URL: [email or web address] Date: 01/15/2023 Prepared by: Fransisco Hertz   Exercises - Seated Trunk Rotation - Arms Crossed  - 1 x daily - 7 x weekly - 2 sets - 10 reps Added 01/25/23 - Pelvic tilt  - 1 x daily - 7 x weekly - 1-2 sets - 10 reps - 5 hold - Hooklying Clamshell with Resistance  - 2 x daily - 7 x weekly - 2 sets - 10 reps - 5 hold    ASSESSMENT:   CLINICAL IMPRESSION: Patient is a 67 y.o. gentleman who was seen today for physical  therapy treatment for chronic low back pain. He reports compliance with HEP. Reviewed HEP and progressed with gentle lumbar mobility and stability. He tolerated session without adverse effects. No increased pain with prescribed therex today. His HEP was updated. Pain level unchanged at end of session.    EVAL: Pt reports pain initially occurred in 2017, resolved but returned about 6 months ago without any recalled change in activity or MOI. Pt reports his primary limitation is transient pain with flexion, increasing pain with dressing/ADLs. On exam pt demos good ROM for flexion although this does provoke concordant pain, no pain with extension or rotation but does have ROM limitations. LE strength good overall but is limited on L more so than R (he denies any post CVA deficits). Pt tolerates rotational HEP well, although with trial of seated extension exercises pt demos gradual increase in resting pain, subsequently discontinued which resolves pain. Reports good understanding of modified HEP as above. No adverse events, denies any resting pain on departure. Recommend skilled PT to address relevant deficits with aim of maximizing functional tolerance for ADLs. Pt departs today's session in no acute distress, all voiced questions/concerns addressed appropriately from PT perspective.      OBJECTIVE IMPAIRMENTS: decreased activity tolerance, decreased mobility, decreased ROM, decreased strength, improper body mechanics, and pain.    ACTIVITY LIMITATIONS: bending, squatting, and dressing   PARTICIPATION LIMITATIONS: cleaning and laundry   PERSONAL FACTORS: Time since onset of injury/illness/exacerbation and 3+ comorbidities: anxiety/schizophrenia, hx CVA and syncope  are also affecting patient's functional outcome.    REHAB POTENTIAL: Good   CLINICAL DECISION MAKING: Stable/uncomplicated   EVALUATION COMPLEXITY: Low     GOALS: Goals reviewed with patient? No   SHORT TERM GOALS: Target date:  02/05/2023 Pt will demonstrate appropriate understanding and performance of initially prescribed HEP in order to facilitate improved independence with management of symptoms.  Baseline: HEP provided on eval Goal status: INITIAL     2.  Pt will demonstrate at least 50% normal lumbar rotation AROM in order to demonstrate improved tolerance to functional movement patterns.  Baseline: see MMT chart above Goal status: INITIAL     LONG TERM GOALS: Target date: 02/26/2023 Pt will score 68 on FOTO in order to demonstrate improved perception of functional status due to symptoms.  Baseline: 63 Goal status: INITIAL   2.  Pt will demonstrate at least 75% normal lumbar rotation AROM in  order to demonstrate improved tolerance to functional movement patterns.  Baseline: see MMT chart above Goal status: INITIAL   3.  Pt will demonstrate symmetrical hip flexion MMT in order to demonstrate improved strength for functional movements.  Baseline: see MMT chart above Goal status: INITIAL   4. Pt will perform 5xSTS in <11 sec in order to demonstrate reduced fall risk and improved functional independence. (MCID of 2.3sec)            Baseline: 13 sec gentle UE support            Goal status: INITIAL    5. Pt will demonstrate appropriate performance of final prescribed HEP in order to facilitate improved self-management of symptoms post-discharge.             Baseline: initial HEP prescribed            Goal status: INITIAL     PLAN:   PT FREQUENCY: 1x/week   PT DURATION: 6 weeks   PLANNED INTERVENTIONS: Therapeutic exercises, Therapeutic activity, Neuromuscular re-education, Balance training, Gait training, Patient/Family education, Self Care, Joint mobilization, Stair training, DME instructions, Aquatic Therapy, Dry Needling, Electrical stimulation, Spinal mobilization, Cryotherapy, Moist heat, Taping, Manual therapy, and Re-evaluation.   PLAN FOR NEXT SESSION: review/update HEP PRN. Progress lumbar  mobility as able/appropriate, work on lumbopelvic stability, LE strengthening. Gradually build more tolerance to flexion   Jannette Spanner, PTA 01/25/23 10:09 AM Phone: [phone] Fax: [phone]

## 2023-01-25 NOTE — Progress Notes (Addendum)
BH MD Outpatient Progress Note  01/25/2023 8:33 AM Curtis Mcdonald  MRN:  [id number]  Assessment:  Curtis Mcdonald presents for follow-up evaluation. Today, 01/25/23, patient reports minimal symptoms of psychosis and said he is quite satisfied with his medications.   Identifying Information: OSHUA Mcdonald is a 67 y.o. male with a history of schizophrenia and anxiety who is an established patient with [facility] participating in follow-up via video conferencing.   Plan:  # Schizophrenia  Anxiety Past medication trials: unknown Status of problem: stable Interventions: -- Continue Latuda 40 mg daily - Aims of 0 on 1/25, 4/19 - EKG October 2023 with QTc of 483 -Lipids unremarkable in February 2023 - Contacted primary care provider, Bertram Denver to ask if they can obtain a repeat EKG and A1c. -- Continue Buspar 10 mg TID -- Decrease hydroxyzine from 25 mg 3 times daily as needed to once daily scheduled to reduce anticholinergic burden - The patient reports that he takes this medication not as needed but twice daily  -Return to care in 3 months, will have to call patient with appointment.   # Sleep Past medication trials: unknown Status of problem: stable Interventions: -- Continue trazodone 300 mg nightly  # Tremor, most likely essential tremor Past medication trials: none Status of problem: New problem to this provider Interventions:  -- Patient was provided with information for [facility] to establish with primary care provider  Patient was given contact information for behavioral health clinic and was instructed to call 911 for emergencies.   Subjective:  Chief Complaint:  Chief Complaint  Patient presents with   Follow-up    Interval History:   Update 4/18: Patient reports he spends most of his day watching TV and reading the Bible.  He reports engaging in church activities twice a week and says this is very meaningful to him.   He reports sleeping well.  He states that he is eating well.  He reports unchanged psychiatric symptoms, saying that 1 time per week he will hear auditory hallucinations and approximately 1 time a week he will experience some feelings of paranoia.  He says that he reads the Bible as a coping strategy and this helps greatly.  He does not wish for any medication changes.  Discussed with him that it would be beneficial to lower the dose of the hydroxyzine.  He was amenable to this.   From my initial interview: Patient reports that he lives alone in his apartment in [place] off of retirement income.  He reports that he has no family in the area.  His speech is nonspontaneous but his thought processes linear and his answers are appropriate.  He reports coming to [place] 10 years ago to attend rehab.  He reports that he drives independently and takes care of his ADLs and IADLs.  He denies experiencing any auditory or visual hallucinations or paranoia or ideas of reference since his last visit.  He reports that his sleep continues to be poor but he is eating appropriately and enjoys watching TV and reading the Bible.  Visit Diagnosis:    ICD-10-CM   1. Paranoid schizophrenia  F20.0     2. Generalized anxiety disorder  F41.1       Past Psychiatric History:  Diagnoses: schizophrenia, anxiety, cocaine abuse  Medication trials: unknown Hospitalizations: denies Suicide attempts: denies Hx of abuse: denies Substance use:   -- Denies use of cannabis, cocaine (last used in 2004), or illicit  drug use  -- Etoh: denies  -- Tobacco: denies  -- Caffeine: 1 cup once a week  Past Medical History:  Past Medical History:  Diagnosis Date   Anxiety    Paranoid schizophrenia    Stroke 2017    Past Surgical History:  Procedure Laterality Date   COLONOSCOPY  2013   WISDOM TOOTH EXTRACTION      Family Psychiatric History: denies  Family History:  Family History  Problem Relation Age of Onset    CAD Neg Hx    Diabetes Neg Hx    Colon cancer Neg Hx    Colon polyps Neg Hx    Esophageal cancer Neg Hx    Rectal cancer Neg Hx    Stomach cancer Neg Hx     Social History:  Social History   Socioeconomic History   Marital status: Single    Spouse name: Not on file   Number of children: Not on file   Years of education: Not on file   Highest education level: GED or equivalent  Occupational History   Occupation: Atm assembly    Comment: [employer]  Tobacco Use   Smoking status: Never   Smokeless tobacco: Never  Vaping Use   Vaping Use: Never used  Substance and Sexual Activity   Alcohol use: No    Comment: fomer    Drug use: No   Sexual activity: Not Currently  Other Topics Concern   Not on file  Social History Narrative   Not on file   Social Determinants of Health   Financial Resource Strain: Low Risk  (12/29/2022)   Overall Financial Resource Strain (CARDIA)    Difficulty of Paying Living Expenses: Not hard at all  Food Insecurity: No Food Insecurity (12/29/2022)   Hunger Vital Sign    Worried About Running Out of Food in the Last Year: Never true    Ran Out of Food in the Last Year: Never true  Transportation Needs: Unknown (12/29/2022)   PRAPARE - Administrator, Civil Service (Medical): Not on file    Lack of Transportation (Non-Medical): No  Physical Activity: Unknown (12/29/2022)   Exercise Vital Sign    Days of Exercise per Week: Patient declined    Minutes of Exercise per Session: Not on file  Stress: Stress Concern Present (12/29/2022)   [employer] of Occupational Health - Occupational Stress Questionnaire    Feeling of Stress : To some extent  Social Connections: Moderately Isolated (12/29/2022)   Social Connection and Isolation Panel [NHANES]    Frequency of Communication with Friends and Family: Never    Frequency of Social Gatherings with Friends and Family: Never    Attends Religious Services: More than 4 times per year    Active  Member of [employer] or Organizations: Yes    Attends Engineer, structural: More than 4 times per year    Marital Status: Never married    Allergies: No Known Allergies  Current Medications: Current Outpatient Medications  Medication Sig Dispense Refill   Vitamin D, Ergocalciferol, (DRISDOL) 1.25 MG (50000 UNIT) CAPS capsule Take 1 capsule (50,000 Units total) by mouth every 7 (seven) days. Please fill as a 90 day supply 12 capsule 0   busPIRone (BUSPAR) 10 MG tablet Take 1 tablet (10 mg total) by mouth 3 (three) times daily. 90 tablet 2   Elastic Bandages & Supports (LUMBAR BACK BRACE/SUPPORT PAD) MISC 1 each by Does not apply route daily. PLEASE FAX TO [phone] along with  demographics and insurance information 1 each 0   hydrOXYzine (ATARAX) 25 MG tablet Take 1 tablet (25 mg total) by mouth 3 (three) times daily as needed for anxiety. 90 tablet 2   lurasidone (LATUDA) 40 MG TABS tablet Take 1 tablet (40 mg total) by mouth daily with breakfast. 30 tablet 2   meloxicam (MOBIC) 7.5 MG tablet Take 1-2 tablets (7.5-15 mg total) by mouth daily. 60 tablet 0   traZODone (DESYREL) 300 MG tablet Take 1 tablet (300 mg total) by mouth at bedtime. 30 tablet 2   No current facility-administered medications for this visit.     Objective:  Psychiatric Specialty Exam: Blood pressure 138/78, pulse 86, height 5\' 9"  (1.753 m), weight 187 lb (84.8 kg).Body mass index is 27.62 kg/m.  General Appearance: Casual and Fairly Groomed  Eye Contact:  Good  Speech:  Clear and Coherent and Normal Rate  Volume:  Normal  Mood:   "good"  Affect:  Constricted and euthymic  Thought Content: occasional and fleeting AH and paranoia  Suicidal Thoughts:  No  Homicidal Thoughts:  No  Thought Process:  Goal Directed and Linear  Orientation:  Full (Time, Place, and Person)    Memory:   Grossly intact  Judgment:  Good  Insight:  Good  Concentration:  Concentration: Good  Recall:  NA  Fund of Knowledge:  Good  Language: Good  Psychomotor Activity: nml  Akathisia:  No  AIMS (if indicated): see above  Assets:  Communication Skills Desire for Improvement Housing Leisure Time Physical Health Transportation  ADL's:  Intact  Cognition: WNL  Sleep:  Good   Physical Exam Constitutional:      Appearance: the patient is not toxic-appearing.  Pulmonary:     Effort: Pulmonary effort is normal.  Neurological:     General: No focal deficit present.     Mental Status: the patient is alert and oriented to person, place, and time.   Review of Systems  Respiratory:  Negative for shortness of breath.   Cardiovascular:  Negative for chest pain.  Gastrointestinal:  Negative for abdominal pain, constipation, diarrhea, nausea and vomiting.  Neurological:  Negative for headaches.    Metabolic Disorder Labs: Lab Results  Component Value Date   HGBA1C 5.1 04/12/2016   MPG 100 04/12/2016   No results found for: "PROLACTIN" Lab Results  Component Value Date   CHOL 110 11/20/2022   TRIG 82 11/20/2022   HDL 33 (L) 11/20/2022   CHOLHDL 3.3 11/20/2022   VLDL 14 04/12/2016   LDLCALC 60 11/20/2022   LDLCALC 84 04/12/2016   Lab Results  Component Value Date   TSH 0.330 (L) 04/12/2016    Therapeutic Level Labs: No results found for: "LITHIUM" No results found for: "VALPROATE" No results found for: "CBMZ"  Screenings:  AIMS    Flowsheet Row Clinical Support from 10/06/2020 in [facility]  AIMS Total Score 0      GAD-7    Flowsheet Row Office Visit from 01/02/2023 in [facility] Office Visit from 11/20/2022 in [facility] Video Visit from 05/11/2022 in [facility] Office Visit from 02/01/2022 in [facility] Clinical Support from 08/23/2021 in [facility]  Total GAD-7 Score 6 10 9 6 1       PHQ2-9     Flowsheet Row Office Visit from 01/02/2023 in [facility] Office Visit  from 11/20/2022 in [facility] Video Visit from 05/11/2022 in [facility] Office Visit from 02/01/2022 in [facility] Clinical Support from 08/23/2021 in [facility]  PHQ-2 Total Score 3 0 2 0 0  PHQ-9 Total Score 5 0 3 -- 1      Flowsheet Row ED from 05/13/2022 in [facility] Office Visit from 02/01/2022 in [facility] Clinical Support from 02/20/2021 in [facility]  C-SSRS RISK CATEGORY No Risk No Risk No Risk       Collaboration of Care: none   I provided 30 minutes of face-to-face time during this encounter.  Carlyn Reichert, MD 01/25/2023, 8:33 AM    Patient with occasional experience of AH and paranoia but denies related dysfunction or mood disturbance with desire to continue medications as prescribed.  I reviewed the patient's chart and discussed the patient and plan of care with the resident. I agree with the findings and plan as documented in the resident's note and above addendum.   Daine Gip, MD 01/25/23

## 2023-01-28 ENCOUNTER — Telehealth: Payer: Commercial Managed Care - HMO | Admitting: Nurse Practitioner

## 2023-01-28 ENCOUNTER — Encounter: Payer: Self-pay | Admitting: Internal Medicine

## 2023-01-28 ENCOUNTER — Ambulatory Visit (AMBULATORY_SURGERY_CENTER): Payer: Commercial Managed Care - HMO | Admitting: Internal Medicine

## 2023-01-28 VITALS — BP 138/79 | HR 70 | Temp 97.5°F | Resp 16 | Ht 69.0 in | Wt 187.0 lb

## 2023-01-28 DIAGNOSIS — D123 Benign neoplasm of transverse colon: Secondary | ICD-10-CM

## 2023-01-28 DIAGNOSIS — Z1211 Encounter for screening for malignant neoplasm of colon: Secondary | ICD-10-CM

## 2023-01-28 MED ORDER — FLEET ENEMA 7-19 GM/118ML RE ENEM
1.0000 | ENEMA | Freq: Once | RECTAL | Status: AC
Start: 2023-01-28 — End: 2023-01-28
  Administered 2023-01-28: 1 via RECTAL

## 2023-01-28 MED ORDER — SODIUM CHLORIDE 0.9 % IV SOLN
500.0000 mL | Freq: Once | INTRAVENOUS | Status: DC
Start: 2023-01-28 — End: 2023-01-28

## 2023-01-28 NOTE — Progress Notes (Signed)
Vitals-DT  Pt's states no medical or surgical changes since previsit or office visit.  Patient ate yesterday, and states that he can't see to the bottom of the bowl.  Enema taken by patient. Instructed to pull cord when finished so staff can view stool.  Stool was cloudy yellow. Able to see through to the bottom of the bowl.  Pt has been stuck 6 times without successful.

## 2023-01-28 NOTE — Op Note (Signed)
[facility] Patient Name: Curtis Mcdonald Procedure Date: 01/28/2023 8:44 AM MRN: [id number] Endoscopist: Beverley Fiedler , MD, [id number] Age: 67 Referring MD:  Date of Birth: 18-Dec-1955 Gender: Male Account #: [id number] Procedure:                Colonoscopy Indications:              Screening for colorectal malignant neoplasm, Last                            colonoscopy: 2013 Medicines:                Monitored Anesthesia Care Procedure:                Pre-Anesthesia Assessment:                           - Prior to the procedure, a History and Physical                            was performed, and patient medications and                            allergies were reviewed. The patient's tolerance of                            previous anesthesia was also reviewed. The risks                            and benefits of the procedure and the sedation                            options and risks were discussed with the patient.                            All questions were answered, and informed consent                            was obtained. Prior Anticoagulants: The patient has                            taken no anticoagulant or antiplatelet agents. ASA                            Grade Assessment: II - A patient with mild systemic                            disease. After reviewing the risks and benefits,                            the patient was deemed in satisfactory condition to                            undergo the procedure.  After obtaining informed consent, the colonoscope                            was passed under direct vision. Throughout the                            procedure, the patient's blood pressure, pulse, and                            oxygen saturations were monitored continuously. The                            Olympus CF-HQ190L [id number]) Colonoscope was                            introduced through the anus and advanced to the                             cecum, identified by appendiceal orifice and                            ileocecal valve. The colonoscopy was performed                            without difficulty. The patient tolerated the                            procedure well. The quality of the bowel                            preparation was good. The ileocecal valve,                            appendiceal orifice, and rectum were photographed. Scope In: 8:51:48 AM Scope Out: 9:05:00 AM Scope Withdrawal Time: 0 hours 11 minutes 25 seconds  Total Procedure Duration: 0 hours 13 minutes 12 seconds  Findings:                 The digital rectal exam was normal.                           Two sessile polyps were found in the transverse                            colon. The polyps were 3 to 9 mm in size. These                            polyps were removed with a cold snare. Resection                            and retrieval were complete.                           Internal hemorrhoids were found during  retroflexion. The hemorrhoids were medium-sized. Complications:            No immediate complications. Estimated Blood Loss:     Estimated blood loss was minimal. Impression:               - Two 3 to 9 mm polyps in the transverse colon,                            removed with a cold snare. Resected and retrieved.                           - Internal hemorrhoids. Recommendation:           - Patient has a contact number available for                            emergencies. The signs and symptoms of potential                            delayed complications were discussed with the                            patient. Return to normal activities tomorrow.                            Written discharge instructions were provided to the                            patient.                           - Resume previous diet.                           - Continue present medications.                            - Await pathology results.                           - Repeat colonoscopy is recommended for                            surveillance. The colonoscopy date will be                            determined after pathology results from today's                            exam become available for review. Beverley Fiedler, MD 01/28/2023 9:11:02 AM This report has been signed electronically.

## 2023-01-28 NOTE — Telephone Encounter (Signed)
  [facility] is calling to check on the  brace order for pt earlier this month. Victorino Dike states that she faxed the office in regards to amendment made for notes in order for prior authorization for LSO (back brace). She is also needing the order form and has requested a signature from PCP.    Please call Victorino Dike: [phone] ext [phone]

## 2023-01-28 NOTE — Patient Instructions (Signed)
-  Handout on polyps, hemorrhoids provided -await pathology results -repeat colonoscopy for surveillance recommended. Date to be determined when pathology result become available   -Continue present medications    YOU HAD AN ENDOSCOPIC PROCEDURE TODAY AT THE [facility]:   Refer to the procedure report that was given to you for any specific questions about what was found during the examination.  If the procedure report does not answer your questions, please call your gastroenterologist to clarify.  If you requested that your care partner not be given the details of your procedure findings, then the procedure report has been included in a sealed envelope for you to review at your convenience later.  YOU SHOULD EXPECT: Some feelings of bloating in the abdomen. Passage of more gas than usual.  Walking can help get rid of the air that was put into your GI tract during the procedure and reduce the bloating. If you had a lower endoscopy (such as a colonoscopy or flexible sigmoidoscopy) you may notice spotting of blood in your stool or on the toilet paper. If you underwent a bowel prep for your procedure, you may not have a normal bowel movement for a few days.  Please Note:  You might notice some irritation and congestion in your nose or some drainage.  This is from the oxygen used during your procedure.  There is no need for concern and it should clear up in a day or so.  SYMPTOMS TO REPORT IMMEDIATELY:  Following lower endoscopy (colonoscopy or flexible sigmoidoscopy):  Excessive amounts of blood in the stool  Significant tenderness or worsening of abdominal pains  Swelling of the abdomen that is new, acute  Fever of 100F or higher  For urgent or emergent issues, a gastroenterologist can be reached at any hour by calling [phone]. Do not use MyChart messaging for urgent concerns.    DIET:  We do recommend a small meal at first, but then you may proceed to your regular diet.   Drink plenty of fluids but you should avoid alcoholic beverages for 24 hours.  ACTIVITY:  You should plan to take it easy for the rest of today and you should NOT DRIVE or use heavy machinery until tomorrow (because of the sedation medicines used during the test).    FOLLOW UP: Our staff will call the number listed on your records the next business day following your procedure.  We will call around 7:15- 8:00 am to check on you and address any questions or concerns that you may have regarding the information given to you following your procedure. If we do not reach you, we will leave a message.     If any biopsies were taken you will be contacted by phone or by letter within the next 1-3 weeks.  Please call us at [phone] if you have not heard about the biopsies in 3 weeks.    SIGNATURES/CONFIDENTIALITY: You and/or your care partner have signed paperwork which will be entered into your electronic medical record.  These signatures attest to the fact that that the information above on your After Visit Summary has been reviewed and is understood.  Full responsibility of the confidentiality of this discharge information lies with you and/or your care-partner.  

## 2023-01-28 NOTE — Progress Notes (Signed)
Called to room to assist during endoscopic procedure.  Patient ID and intended procedure confirmed with present staff. Received instructions for my participation in the procedure from the performing physician.  

## 2023-01-28 NOTE — Progress Notes (Signed)
Vss nad trans to pacu 

## 2023-01-28 NOTE — Progress Notes (Signed)
GASTROENTEROLOGY PROCEDURE H&P NOTE   Primary Care Physician: Claiborne Rigg, NP    Reason for Procedure:  Colon cancer screening  Plan:    Colonoscopy  Patient is appropriate for endoscopic procedure(s) in the ambulatory ([facility]) setting.  The nature of the procedure, as well as the risks, benefits, and alternatives were carefully and thoroughly reviewed with the patient. Ample time for discussion and questions allowed. The patient understood, was satisfied, and agreed to proceed.     HPI: Curtis Mcdonald is a 67 y.o. male who presents for colonoscopy.  Medical history as below.  Tolerated the prep.  No recent chest pain or shortness of breath.  No abdominal pain today.  Past Medical History:  Diagnosis Date   Anxiety    Paranoid schizophrenia    Stroke 2017    Past Surgical History:  Procedure Laterality Date   COLONOSCOPY  2013   WISDOM TOOTH EXTRACTION      Prior to Admission medications   Medication Sig Start Date End Date Taking? Authorizing Provider  busPIRone (BUSPAR) 10 MG tablet Take 1 tablet (10 mg total) by mouth 3 (three) times daily. 01/25/23 05/25/23 Yes Carlyn Reichert, MD  Elastic Bandages & Supports (LUMBAR BACK BRACE/SUPPORT PAD) MISC 1 each by Does not apply route daily. PLEASE FAX TO [phone] along with demographics and insurance information 01/02/23  Yes Claiborne Rigg, NP  hydrOXYzine (ATARAX) 25 MG tablet Take 1 tablet (25 mg total) by mouth daily. 01/25/23 07/24/23 Yes Carlyn Reichert, MD  lurasidone (LATUDA) 40 MG TABS tablet Take 1 tablet (40 mg total) by mouth daily with breakfast. 01/25/23 07/24/23 Yes Carlyn Reichert, MD  meloxicam (MOBIC) 7.5 MG tablet Take 1-2 tablets (7.5-15 mg total) by mouth daily. 12/19/22  Yes Claiborne Rigg, NP  traZODone (DESYREL) 300 MG tablet Take 1 tablet (300 mg total) by mouth at bedtime. 01/02/23 04/02/23 Yes Claiborne Rigg, NP  Vitamin D, Ergocalciferol, (DRISDOL) 1.25 MG (50000 UNIT) CAPS capsule Take 1  capsule (50,000 Units total) by mouth every 7 (seven) days. Please fill as a 90 day supply 01/16/23  Yes Claiborne Rigg, NP    Current Outpatient Medications  Medication Sig Dispense Refill   busPIRone (BUSPAR) 10 MG tablet Take 1 tablet (10 mg total) by mouth 3 (three) times daily. 180 tablet 1   Elastic Bandages & Supports (LUMBAR BACK BRACE/SUPPORT PAD) MISC 1 each by Does not apply route daily. PLEASE FAX TO [phone] along with demographics and insurance information 1 each 0   hydrOXYzine (ATARAX) 25 MG tablet Take 1 tablet (25 mg total) by mouth daily. 90 tablet 1   lurasidone (LATUDA) 40 MG TABS tablet Take 1 tablet (40 mg total) by mouth daily with breakfast. 90 tablet 1   meloxicam (MOBIC) 7.5 MG tablet Take 1-2 tablets (7.5-15 mg total) by mouth daily. 60 tablet 0   traZODone (DESYREL) 300 MG tablet Take 1 tablet (300 mg total) by mouth at bedtime. 30 tablet 2   Vitamin D, Ergocalciferol, (DRISDOL) 1.25 MG (50000 UNIT) CAPS capsule Take 1 capsule (50,000 Units total) by mouth every 7 (seven) days. Please fill as a 90 day supply 12 capsule 0   Current Facility-Administered Medications  Medication Dose Route Frequency Provider Last Rate Last Admin   0.9 %  sodium chloride infusion  500 mL Intravenous Once Kysa Calais, Carie Caddy, MD        Allergies as of 01/28/2023   (No Known Allergies)    Family History  Problem Relation Age of Onset   CAD Neg Hx    Diabetes Neg Hx    Colon cancer Neg Hx    Colon polyps Neg Hx    Esophageal cancer Neg Hx    Rectal cancer Neg Hx    Stomach cancer Neg Hx     Social History   Socioeconomic History   Marital status: Single    Spouse name: Not on file   Number of children: Not on file   Years of education: Not on file   Highest education level: GED or equivalent  Occupational History   Occupation: Atm assembly    Comment: [employer]  Tobacco Use   Smoking status: Never   Smokeless tobacco: Never  Vaping Use   Vaping Use: Never used   Substance and Sexual Activity   Alcohol use: No    Comment: fomer    Drug use: No   Sexual activity: Not Currently  Other Topics Concern   Not on file  Social History Narrative   Not on file   Social Determinants of Health   Financial Resource Strain: Low Risk  (12/29/2022)   Overall Financial Resource Strain (CARDIA)    Difficulty of Paying Living Expenses: Not hard at all  Food Insecurity: No Food Insecurity (12/29/2022)   Hunger Vital Sign    Worried About Running Out of Food in the Last Year: Never true    Ran Out of Food in the Last Year: Never true  Transportation Needs: Unknown (12/29/2022)   PRAPARE - Administrator, Civil Service (Medical): Not on file    Lack of Transportation (Non-Medical): No  Physical Activity: Unknown (12/29/2022)   Exercise Vital Sign    Days of Exercise per Week: Patient declined    Minutes of Exercise per Session: Not on file  Stress: Stress Concern Present (12/29/2022)   [employer] of Occupational Health - Occupational Stress Questionnaire    Feeling of Stress : To some extent  Social Connections: Moderately Isolated (12/29/2022)   Social Connection and Isolation Panel [NHANES]    Frequency of Communication with Friends and Family: Never    Frequency of Social Gatherings with Friends and Family: Never    Attends Religious Services: More than 4 times per year    Active Member of [employer] or Organizations: Yes    Attends Banker Meetings: More than 4 times per year    Marital Status: Never married  Intimate Partner Violence: Not At Risk (03/30/2020)   Humiliation, Afraid, Rape, and Kick questionnaire    Fear of Current or Ex-Partner: No    Emotionally Abused: No    Physically Abused: No    Sexually Abused: No    Physical Exam: Vital signs in last 24 hours:  119/76 (BP Location: Right Arm, Patient Position: Sitting, Cuff Size: Normal)   Pulse 76   Temp (!) 97.5 F (36.4 C) (Temporal)   Ht  (1.753 m)    Wt 187 lb (84.8 kg)   SpO2 95%   BMI 27.62 kg/m  GEN: NAD EYE: Sclerae anicteric ENT: MMM CV: Non-tachycardic Pulm: CTA b/l GI: Soft, NT/ND NEURO:  Alert & Oriented x 3   Erick Blinks, MD [facility]  01/28/2023 8:46 AM

## 2023-01-29 ENCOUNTER — Telehealth: Payer: Self-pay | Admitting: *Deleted

## 2023-01-29 NOTE — Telephone Encounter (Signed)
Provider aware and I will fax when signed.

## 2023-01-29 NOTE — Telephone Encounter (Signed)
No answer on  follow up call. Left message.   

## 2023-01-31 ENCOUNTER — Ambulatory Visit: Payer: Commercial Managed Care - HMO | Admitting: Physical Therapy

## 2023-01-31 ENCOUNTER — Encounter: Payer: Self-pay | Admitting: Internal Medicine

## 2023-01-31 ENCOUNTER — Encounter: Payer: Self-pay | Admitting: Physical Therapy

## 2023-01-31 DIAGNOSIS — M6281 Muscle weakness (generalized): Secondary | ICD-10-CM

## 2023-01-31 DIAGNOSIS — M5459 Other low back pain: Secondary | ICD-10-CM

## 2023-01-31 NOTE — Therapy (Signed)
OUTPATIENT PHYSICAL THERAPY TREATMENT NOTE   Patient Name: Curtis Mcdonald MRN: [id number] DOB:[birth year], 67 y.o., male Today's Date: 01/31/2023  PCP: Claiborne Rigg, NP   REFERRING PROVIDER: Claiborne Rigg, NP    END OF SESSION:   PT End of Session - 01/31/23 1016     Visit Number 3    Number of Visits 7    Date for PT Re-Evaluation 03/12/23    Authorization Type [employer]/MCD secondary    Authorization Time Period auth pending    PT Start Time 1017    PT Stop Time 1055    PT Time Calculation (min) 38 min    Activity Tolerance Patient tolerated treatment well;No increased pain    Behavior During Therapy [facility] for tasks assessed/performed              Past Medical History:  Diagnosis Date   Anxiety    Paranoid schizophrenia    Stroke 2017   Past Surgical History:  Procedure Laterality Date   COLONOSCOPY  2013   WISDOM TOOTH EXTRACTION     Patient Active Problem List   Diagnosis Date Noted   Schizophrenia, unspecified 03/02/2020   Generalized anxiety disorder 02/22/2020   LOC (loss of consciousness)    Intracranial hemorrhage 04/11/2016   Nasal bone fracture 04/11/2016   Syncope 04/11/2016   Syncope and collapse 04/11/2016   Lumbar back pain 04/07/2012    REFERRING DIAG: M54.50 (ICD-10-CM) - Lumbar back pain  THERAPY DIAG:  Other low back pain  Muscle weakness (generalized)  Rationale for Evaluation and Treatment Rehabilitation  PERTINENT HISTORY:  ICH, syncope, schizophrenia, anxiety (pt states these are all well controlled) PRECAUTIONS: hx ICH/syncope   SUBJECTIVE:                                                                                                                                                                                      SUBJECTIVE STATEMENT:   Pt arrives w/ 5/10 pain, states he thinks exercises last session were helpful. States HEP has been going well and reduces his pain.  PAIN:  Are you having pain:  5/10 Location/description: low back, starting midline back moving laterally BIL Best-worst over past week: 0-8/10, eases quickly   - aggravating factors: bending, lower body dressing  - Easing factors: brace, rest/changing positioning   OBJECTIVE: (objective measures completed at initial evaluation unless otherwise dated)   DIAGNOSTIC FINDINGS:  11/24/22 Lumbar XR: IMPRESSION: Minimal degenerative endplate changes in the lumbar spine.   PATIENT SURVEYS:  FOTO 63 current, 68 predicted   SCREENING FOR RED FLAGS: Red flag questioning/screening reassuring     COGNITION: Overall cognitive status: Within functional limits  for tasks assessed                          SENSATION: WFL BIL LE     POSTURE: forward head, increased lordosis   PALPATION: Concordant tenderness BIL distal QL and SIJ, unable to assess further as pt has back brace donned on arrival   LUMBAR ROM:    AROM eval  Flexion 100% * (touches ankles)  Extension 50%  Right lateral flexion Lateral knee *    Left lateral flexion Lateral knee *  Right rotation 25% s  Left rotation 25% s   (Blank rows = not tested) (Key: WFL = within functional limits not formally assessed, * = concordant pain, s = stiffness/stretching sensation, NT = not tested)    LOWER EXTREMITY ROM:      Active  Right eval Left eval  Hip flexion      Hip extension      Hip internal rotation      Hip external rotation      Knee extension      Knee flexion      (Blank rows = not tested) (Key: WFL = within functional limits not formally assessed, * = concordant pain, s = stiffness/stretching sensation, NT = not tested)  Comments:     LOWER EXTREMITY MMT:     MMT Right eval Left eval  Hip flexion 4+ 4-  Hip abduction (modified sitting) 5 5  Hip internal rotation      Hip external rotation      Knee flexion 4+ 4+  Knee extension 4+ 4  Ankle dorsiflexion 5 5    (Blank rows = not tested) (Key: WFL = within functional limits not  formally assessed, * = concordant pain, s = stiffness/stretching sensation, NT = not tested)  Comments: all MMT painless      FUNCTIONAL TESTS:  5xSTS: 13.12sec gentle UE support, no increase in pain   GAIT: Distance walked: within clinic Assistive device utilized: None Level of assistance: Complete Independence Comments: widened BOS, reduced gait speed/cadence   TODAY'S TREATMENT:                                                                                                                              [facility] Adult PT Treatment:                                                DATE: 01/31/23 Therapeutic Exercise: Nu step L5 LE/UE during subjective Supine LTR x8 BIL cues for comfortable ROM and breath control Supine SKTC 3x30sec BIL cues for breath control  Bridge 2x10 cues for reduced compensations  Seated adductor iso 2x12 cues for posture and breath control  Seated march x10 BIL cues for breath control and reduced trunk lean  CC paloff press 3# x8 BIL cues for posture  Seated swiss ball flexion x10 cues for comfortable ROM  Seated hamstring curl 2x30sec BIL LE  Seated GTB hip abduction 2x10 cues for posture HEP update + education    [facility] Adult PT Treatment:                                                DATE: 01/25/23 Therapeutic Exercise: Nustep L4 UE/LE x 5 minutes  Seated trunk rotation x 10 Supine LTR within comfortable ROM SKTC  10 sec x 4 each  PPT 3 sec 10 x 2  Side hip abduction x 10 each  SLR x 10 each with ab brace  Supine clam , green band with ab brace x 10 Hooklying ball squeeze with ab brace  x 10      PATIENT EDUCATION:  Education details: Pt education on PT impairments, prognosis, and POC. Informed consent. Rationale for interventions, safe/appropriate HEP performance Person educated: Patient Education method: Explanation, Demonstration, Tactile cues, Verbal cues, and Handouts Education comprehension: verbalized understanding, returned demonstration,  verbal cues required, tactile cues required, and needs further education     HOME EXERCISE PROGRAM: Access Code: [email or web address] URL: [email or web address] Date: 01/31/2023 Prepared by: Fransisco Hertz  Exercises - Pelvic tilt  - 1 x daily - 7 x weekly - 1-2 sets - 10 reps - 5 hold - Supine Lower Trunk Rotation  - 1 x daily - 7 x weekly - 2 sets - 8 reps - Seated Hip Abduction with Resistance  - 1 x daily - 7 x weekly - 3 sets - 10 reps    ASSESSMENT:   CLINICAL IMPRESSION: Pt arrives w/ report of 5/10 pain, states HEP is going well and seems to reduce pain, did well after last session. Today focusing on progression of lumbar mobility exercises and gentle lumbopelvic stability training. Pt tolerates well with cues as above, no increases in pain and no adverse events. Pt departs with report of 4/10 on NPS. Recommend continuing along current POC in order to address relevant deficits and improve functional tolerance. Pt departs today's session in no acute distress, all voiced questions/concerns addressed appropriately from PT perspective.      EVAL: Pt reports pain initially occurred in 2017, resolved but returned about 6 months ago without any recalled change in activity or MOI. Pt reports his primary limitation is transient pain with flexion, increasing pain with dressing/ADLs. On exam pt demos good ROM for flexion although this does provoke concordant pain, no pain with extension or rotation but does have ROM limitations. LE strength good overall but is limited on L more so than R (he denies any post CVA deficits). Pt tolerates rotational HEP well, although with trial of seated extension exercises pt demos gradual increase in resting pain, subsequently discontinued which resolves pain. Reports good understanding of modified HEP as above. No adverse events, denies any resting pain on departure. Recommend skilled PT to address relevant deficits with aim of maximizing functional tolerance for  ADLs. Pt departs today's session in no acute distress, all voiced questions/concerns addressed appropriately from PT perspective.      OBJECTIVE IMPAIRMENTS: decreased activity tolerance, decreased mobility, decreased ROM, decreased strength, improper body mechanics, and pain.    ACTIVITY LIMITATIONS: bending, squatting, and dressing   PARTICIPATION LIMITATIONS: cleaning and laundry   PERSONAL FACTORS: Time since  onset of injury/illness/exacerbation and 3+ comorbidities: anxiety/schizophrenia, hx CVA and syncope  are also affecting patient's functional outcome.    REHAB POTENTIAL: Good   CLINICAL DECISION MAKING: Stable/uncomplicated   EVALUATION COMPLEXITY: Low     GOALS: Goals reviewed with patient? No   SHORT TERM GOALS: Target date: 02/05/2023 Pt will demonstrate appropriate understanding and performance of initially prescribed HEP in order to facilitate improved independence with management of symptoms.  Baseline: HEP provided on eval Goal status: INITIAL     2.  Pt will demonstrate at least 50% normal lumbar rotation AROM in order to demonstrate improved tolerance to functional movement patterns.  Baseline: see MMT chart above Goal status: INITIAL     LONG TERM GOALS: Target date: 02/26/2023 Pt will score 68 on FOTO in order to demonstrate improved perception of functional status due to symptoms.  Baseline: 63 Goal status: INITIAL   2.  Pt will demonstrate at least 75% normal lumbar rotation AROM in order to demonstrate improved tolerance to functional movement patterns.  Baseline: see MMT chart above Goal status: INITIAL   3.  Pt will demonstrate symmetrical hip flexion MMT in order to demonstrate improved strength for functional movements.  Baseline: see MMT chart above Goal status: INITIAL   4. Pt will perform 5xSTS in <11 sec in order to demonstrate reduced fall risk and improved functional independence. (MCID of 2.3sec)            Baseline: 13 sec gentle UE  support            Goal status: INITIAL    5. Pt will demonstrate appropriate performance of final prescribed HEP in order to facilitate improved self-management of symptoms post-discharge.             Baseline: initial HEP prescribed            Goal status: INITIAL     PLAN:   PT FREQUENCY: 1x/week   PT DURATION: 6 weeks   PLANNED INTERVENTIONS: Therapeutic exercises, Therapeutic activity, Neuromuscular re-education, Balance training, Gait training, Patient/Family education, Self Care, Joint mobilization, Stair training, DME instructions, Aquatic Therapy, Dry Needling, Electrical stimulation, Spinal mobilization, Cryotherapy, Moist heat, Taping, Manual therapy, and Re-evaluation.   PLAN FOR NEXT SESSION: review/update HEP PRN. Progress lumbar mobility as able/appropriate, work on lumbopelvic stability, LE strengthening. Gradually build more tolerance to flexion  Ashley Murrain PT, DPT 01/31/2023 12:36 PM

## 2023-02-01 ENCOUNTER — Other Ambulatory Visit (HOSPITAL_COMMUNITY): Payer: Self-pay

## 2023-02-01 ENCOUNTER — Telehealth: Payer: Self-pay

## 2023-02-01 NOTE — Telephone Encounter (Signed)
[facility] Patient Product/process development scientist completed.    The patient is insured through [place].  Insurance will pay for Curtis Mcdonald   Medication will need a PA   We will continue to follow to see if copay assistance is needed.  [name], CPhT [facility] Phone: [phone] Fax:  [phone]

## 2023-02-04 ENCOUNTER — Ambulatory Visit (INDEPENDENT_AMBULATORY_CARE_PROVIDER_SITE_OTHER): Payer: Commercial Managed Care - HMO | Admitting: Infectious Disease

## 2023-02-04 ENCOUNTER — Other Ambulatory Visit: Payer: Self-pay

## 2023-02-04 ENCOUNTER — Encounter: Payer: Self-pay | Admitting: Infectious Disease

## 2023-02-04 VITALS — BP 138/85 | HR 84 | Temp 97.7°F | Ht 69.0 in | Wt 179.0 lb

## 2023-02-04 DIAGNOSIS — B182 Chronic viral hepatitis C: Secondary | ICD-10-CM

## 2023-02-04 HISTORY — DX: Chronic viral hepatitis C: B18.2

## 2023-02-04 NOTE — Progress Notes (Signed)
Reason for infectious ease consult: Chronic hepatitis C without hepatic coma  Requesting physician: Bertram Denver, NP  Subjective:    Patient ID: Curtis Mcdonald, male    DOB: Dec 21, 1955, 67 y.o.   MRN: [id number]  HPI  Curtis Mcdonald is a 67 year old Black man with hx of CVA, schizophrenia was referred to [place] for treatment of his chronic hepatitis C without hepatic coma.  He does not recall having had a blood transfusion but also has not had tattoos he is not sexually active for the last 10 years.  He denies intravenous drug use.  He is tested negative for HIV as well as hepatitis B.     Past Medical History:  Diagnosis Date   Anxiety    Paranoid schizophrenia (HCC)    Stroke (HCC) 2017    Past Surgical History:  Procedure Laterality Date   COLONOSCOPY  2013   WISDOM TOOTH EXTRACTION      Family History  Problem Relation Age of Onset   CAD Neg Hx    Diabetes Neg Hx    Colon cancer Neg Hx    Colon polyps Neg Hx    Esophageal cancer Neg Hx    Rectal cancer Neg Hx    Stomach cancer Neg Hx       Social History   Socioeconomic History   Marital status: Single    Spouse name: Not on file   Number of children: Not on file   Years of education: Not on file   Highest education level: GED or equivalent  Occupational History   Occupation: Atm assembly    Comment: [employer]  Tobacco Use   Smoking status: Never   Smokeless tobacco: Never  Vaping Use   Vaping Use: Never used  Substance and Sexual Activity   Alcohol use: No    Comment: fomer    Drug use: No   Sexual activity: Not Currently  Other Topics Concern   Not on file  Social History Narrative   Not on file   Social Determinants of Health   Financial Resource Strain: Low Risk  (12/29/2022)   Overall Financial Resource Strain (CARDIA)    Difficulty of Paying Living Expenses: Not hard at all  Food Insecurity: No Food Insecurity (12/29/2022)   Hunger Vital Sign    Worried About Running Out of Food in the Last  Year: Never true    Ran Out of Food in the Last Year: Never true  Transportation Needs: Unknown (12/29/2022)   PRAPARE - Administrator, Civil Service (Medical): Not on file    Lack of Transportation (Non-Medical): No  Physical Activity: Unknown (12/29/2022)   Exercise Vital Sign    Days of Exercise per Week: Patient declined    Minutes of Exercise per Session: Not on file  Stress: Stress Concern Present (12/29/2022)   [employer] of Occupational Health - Occupational Stress Questionnaire    Feeling of Stress : To some extent  Social Connections: Moderately Isolated (12/29/2022)   Social Connection and Isolation Panel [NHANES]    Frequency of Communication with Friends and Family: Never    Frequency of Social Gatherings with Friends and Family: Never    Attends Religious Services: More than 4 times per year    Active Member of [employer] or Organizations: Yes    Attends Engineer, structural: More than 4 times per year    Marital Status: Never married    No Known Allergies   Current Outpatient Medications:  busPIRone (BUSPAR) 10 MG tablet, Take 1 tablet (10 mg total) by mouth 3 (three) times daily., Disp: 180 tablet, Rfl: 1   Elastic Bandages & Supports (LUMBAR BACK BRACE/SUPPORT PAD) MISC, 1 each by Does not apply route daily. PLEASE FAX TO [phone] along with demographics and insurance information, Disp: 1 each, Rfl: 0   hydrOXYzine (ATARAX) 25 MG tablet, Take 1 tablet (25 mg total) by mouth daily., Disp: 90 tablet, Rfl: 1   lurasidone (LATUDA) 40 MG TABS tablet, Take 1 tablet (40 mg total) by mouth daily with breakfast., Disp: 90 tablet, Rfl: 1   meloxicam (MOBIC) 7.5 MG tablet, Take 1-2 tablets (7.5-15 mg total) by mouth daily., Disp: 60 tablet, Rfl: 0   traZODone (DESYREL) 300 MG tablet, Take 1 tablet (300 mg total) by mouth at bedtime., Disp: 30 tablet, Rfl: 2   Vitamin D, Ergocalciferol, (DRISDOL) 1.25 MG (50000 UNIT) CAPS capsule, Take 1 capsule (50,000  Units total) by mouth every 7 (seven) days. Please fill as a 90 day supply, Disp: 12 capsule, Rfl: 0   Review of Systems  Constitutional:  Negative for activity change, appetite change, chills, diaphoresis, fatigue, fever and unexpected weight change.  HENT:  Negative for congestion, rhinorrhea, sinus pressure, sneezing, sore throat and trouble swallowing.   Eyes:  Negative for photophobia and visual disturbance.  Respiratory:  Negative for cough, chest tightness, shortness of breath, wheezing and stridor.   Cardiovascular:  Negative for chest pain, palpitations and leg swelling.  Gastrointestinal:  Negative for abdominal distention, abdominal pain, anal bleeding, blood in stool, constipation, diarrhea, nausea and vomiting.  Genitourinary:  Negative for difficulty urinating, dysuria, flank pain and hematuria.  Musculoskeletal:  Negative for arthralgias, back pain, gait problem, joint swelling and myalgias.  Skin:  Negative for color change, pallor, rash and wound.  Neurological:  Negative for dizziness, tremors, weakness, light-headedness and headaches.  Hematological:  Negative for adenopathy. Does not bruise/bleed easily.  Psychiatric/Behavioral:  Negative for agitation, behavioral problems, confusion, decreased concentration, dysphoric mood, sleep disturbance and suicidal ideas.        Objective:   Physical Exam Constitutional:      Appearance: He is well-developed.  HENT:     Head: Normocephalic and atraumatic.  Eyes:     Conjunctiva/sclera: Conjunctivae normal.  Cardiovascular:     Rate and Rhythm: Normal rate and regular rhythm.  Pulmonary:     Effort: Pulmonary effort is normal. No respiratory distress.     Breath sounds: No wheezing.  Abdominal:     General: There is no distension.     Palpations: Abdomen is soft.  Musculoskeletal:        General: No tenderness. Normal range of motion.     Cervical back: Normal range of motion and neck supple.  Skin:    General: Skin is  warm and dry.     Coloration: Skin is not pale.     Findings: No erythema or rash.  Neurological:     General: No focal deficit present.     Mental Status: He is alert and oriented to person, place, and time.  Psychiatric:        Mood and Affect: Mood normal.        Behavior: Behavior normal.        Thought Content: Thought content normal.        Judgment: Judgment normal.           Assessment & Plan:   Chronic hepatitis C without hepatic coma:  I  will check hep A total abs, to be quantitative antibody, hepatitis C genotype, FibroSure ultrasound with elastography HIV RNA  We will endeavor to start Epclusa  I have personally spent 82 minutes involved in face-to-face and non-face-to-face activities for this patient on the day of the visit. Professional time spent includes the following activities: Preparing to see the patient (review of tests), Obtaining and/or reviewing separately obtained history (admission/discharge record), Performing a medically appropriate examination and/or evaluation , Ordering medications/tests/procedures, referring and communicating with other health care professionals, Documenting clinical information in the EMR, Independently interpreting results (not separately reported), Communicating results to the patient/family/caregiver, Counseling and educating the patient/family/caregiver and Care coordination (not separately reported).

## 2023-02-05 ENCOUNTER — Other Ambulatory Visit: Payer: Self-pay | Admitting: Nurse Practitioner

## 2023-02-05 DIAGNOSIS — E559 Vitamin D deficiency, unspecified: Secondary | ICD-10-CM

## 2023-02-05 LAB — HEPATITIS B SURFACE ANTIBODY, QUANTITATIVE: Hep B S AB Quant (Post): <5 m[IU]/mL — ABNORMAL LOW (ref >=10)

## 2023-02-05 MED ORDER — VITAMIN D (ERGOCALCIFEROL) 1.25 MG (50000 UNIT) PO CAPS
50000.0000 [IU] | ORAL_CAPSULE | ORAL | 0 refills | Status: DC
Start: 2023-02-05 — End: 2023-06-19

## 2023-02-05 NOTE — Therapy (Signed)
OUTPATIENT PHYSICAL THERAPY TREATMENT NOTE   Patient Name: Curtis Mcdonald MRN: [id number] DOB:1955-10-13, 67 y.o., male Today's Date: 02/06/2023  PCP: Claiborne Rigg, NP   REFERRING PROVIDER: Claiborne Rigg, NP    END OF SESSION:   PT End of Session - 02/06/23 1100     Visit Number 4    Number of Visits 7    Date for PT Re-Evaluation 03/12/23    Authorization Type [employer]/MCD secondary    Authorization Time Period 3 PT visis from 01/21/23-02/10/23    Authorization - Visit Number 3    Authorization - Number of Visits 3    PT Start Time 1101    PT Stop Time 1140    PT Time Calculation (min) 39 min    Activity Tolerance Patient tolerated treatment well;No increased pain    Behavior During Therapy [facility] for tasks assessed/performed               Past Medical History:  Diagnosis Date   Anxiety    Chronic hepatitis C without hepatic coma (HCC) 02/04/2023   Paranoid schizophrenia (HCC)    Stroke (HCC) 2017   Past Surgical History:  Procedure Laterality Date   COLONOSCOPY  2013   WISDOM TOOTH EXTRACTION     Patient Active Problem List   Diagnosis Date Noted   Chronic hepatitis C without hepatic coma (HCC) 02/04/2023   Schizophrenia, unspecified (HCC) 03/02/2020   Generalized anxiety disorder 02/22/2020   LOC (loss of consciousness) (HCC)    Intracranial hemorrhage (HCC) 04/11/2016   Nasal bone fracture 04/11/2016   Syncope 04/11/2016   Syncope and collapse 04/11/2016   Lumbar back pain 04/07/2012    REFERRING DIAG: M54.50 (ICD-10-CM) - Lumbar back pain  THERAPY DIAG:  Other low back pain  Muscle weakness (generalized)  Rationale for Evaluation and Treatment Rehabilitation  PERTINENT HISTORY:  ICH, syncope, schizophrenia, anxiety (pt states these are all well controlled) PRECAUTIONS: hx ICH/syncope, HEP C  SUBJECTIVE:                                                                                                                                                                                       SUBJECTIVE STATEMENT:   Pt states he feels he is improving since starting therapy, particularly with frequency of pain. Pt states he has been doing HEP but is seems to be a little more irritable. Notes his movement seems about the same    PAIN:  Are you having pain: 6/10 Location/description: low back, starting midline back moving laterally BIL Worst in past week 7/10  Per eval-  Best-worst over past week: 0-8/10, eases quickly   -  aggravating factors: bending, lower body dressing  - Easing factors: brace, rest/changing positioning   OBJECTIVE: (objective measures completed at initial evaluation unless otherwise dated)   DIAGNOSTIC FINDINGS:  11/24/22 Lumbar XR: IMPRESSION: Minimal degenerative endplate changes in the lumbar spine.   PATIENT SURVEYS:  FOTO 63 current, 68 predicted 02/06/23: 57    SCREENING FOR RED FLAGS: Red flag questioning/screening reassuring     COGNITION: Overall cognitive status: Within functional limits for tasks assessed                          SENSATION: WFL BIL LE     POSTURE: forward head, increased lordosis   PALPATION: Concordant tenderness BIL distal QL and SIJ, unable to assess further as pt has back brace donned on arrival   LUMBAR ROM:    AROM eval 02/06/23 AROM  Flexion 100% * (touches ankles)   Extension 50%   Right lateral flexion Lateral knee *     Left lateral flexion Lateral knee *   Right rotation 25% s 50% s  Left rotation 25% s 25% s   (Blank rows = not tested) (Key: WFL = within functional limits not formally assessed, * = concordant pain, s = stiffness/stretching sensation, NT = not tested)    LOWER EXTREMITY ROM:      Active  Right eval Left eval  Hip flexion      Hip extension      Hip internal rotation      Hip external rotation      Knee extension      Knee flexion      (Blank rows = not tested) (Key: WFL = within functional limits not formally assessed, * =  concordant pain, s = stiffness/stretching sensation, NT = not tested)  Comments:     LOWER EXTREMITY MMT:     MMT Right eval Left eval R/L 02/06/23  Hip flexion 4+ 4- 4+/4 painless BIL  Hip abduction (modified sitting) 5 5   Hip internal rotation       Hip external rotation       Knee flexion 4+ 4+   Knee extension 4+ 4   Ankle dorsiflexion 5 5     (Blank rows = not tested) (Key: WFL = within functional limits not formally assessed, * = concordant pain, s = stiffness/stretching sensation, NT = not tested)  Comments: all MMT painless      FUNCTIONAL TESTS:  5xSTS: 13.12sec gentle UE support, no increase in pain 02/06/23: 15.47sec gentle UE support no increase in pain   GAIT: Distance walked: within clinic Assistive device utilized: None Level of assistance: Complete Independence Comments: widened BOS, reduced gait speed/cadence  Vitals 02/06/23: HR 89-90, SpO2 97-98% RA, BP 130/75   TODAY'S TREATMENT:  [facility] Adult PT Treatment:                                                DATE: 02/06/23 Therapeutic Exercise: Nu step L5 UE/LE 5 min during subjective LTR x8 BIL cues for comfortable ROM  SKTC x3 BIL, 30sec hold cues for appropriate ROM and breath control  PPT 2x10 cues for breath control  RTB seated paloff press 2x10 each way cues for posture and form  Seated adductor iso 2x12 cues for upright posture and breath control  Seated GTB hip abduction 2x12 cues for form Verbal HEP review and education  Therapeutic Activity: MSK assessment + education  5xSTS + education FOTO + education  [facility] Adult PT Treatment:                                                DATE: 01/31/23 Therapeutic Exercise: Nu step L5 LE/UE during subjective Supine LTR x8 BIL cues for comfortable ROM and breath control Supine SKTC 3x30sec BIL cues for breath control  Bridge 2x10  cues for reduced compensations  Seated adductor iso 2x12 cues for posture and breath control  Seated march x10 BIL cues for breath control and reduced trunk lean  CC paloff press 3# x8 BIL cues for posture  Seated swiss ball flexion x10 cues for comfortable ROM  Seated hamstring curl 2x30sec BIL LE  Seated GTB hip abduction 2x10 cues for posture HEP update + education    [facility] Adult PT Treatment:                                                DATE: 01/25/23 Therapeutic Exercise: Nustep L4 UE/LE x 5 minutes  Seated trunk rotation x 10 Supine LTR within comfortable ROM SKTC  10 sec x 4 each  PPT 3 sec 10 x 2  Side hip abduction x 10 each  SLR x 10 each with ab brace  Supine clam , green band with ab brace x 10 Hooklying ball squeeze with ab brace  x 10      PATIENT EDUCATION:  Education details: rationale for interventions Person educated: Patient Education method: Explanation, Demonstration, Tactile cues, Verbal cues, and Handouts Education comprehension: verbalized understanding, returned demonstration, verbal cues required, tactile cues required, and needs further education     HOME EXERCISE PROGRAM: Access Code: [email or web address] URL: [email or web address] Date: 01/31/2023 Prepared by: Fransisco Hertz  Exercises - Pelvic tilt  - 1 x daily - 7 x weekly - 1-2 sets - 10 reps - 5 hold - Supine Lower Trunk Rotation  - 1 x daily - 7 x weekly - 2 sets - 8 reps - Seated Hip Abduction with Resistance  - 1 x daily - 7 x weekly - 3 sets - 10 reps    ASSESSMENT:   CLINICAL IMPRESSION: 02/06/2023 Pt arrives w/ 5/10 pain, notes that he continues to adhere well to HEP but seems like it's a little bit more irritating over past week. Looked at STG/LTG as noted below, some modest improvements in lumbar ROM and hip MMT although limited as expected with  today as fourth visit (including evaluation). Did note slight regression in 5xSTS and FOTO score today which may be attributable in part to  pt report of increased symptom irritability over past week. Pt also appears to have increased fatigue today, when discussing with pt he also describes a bit of dizziness earlier this AM, no adverse symptoms at present - vitals checked and WNL, pt states this is not unusual for him but this writer did encourage him to call provider if symptoms worsen or become more persistent. Departs session without increase in pain or adverse event. Recommend continuing along current POC in order to address relevant deficits and improve functional tolerance. Pt departs today's session in no acute distress, all voiced questions/concerns addressed appropriately from PT perspective.     EVAL: Pt reports pain initially occurred in 2017, resolved but returned about 6 months ago without any recalled change in activity or MOI. Pt reports his primary limitation is transient pain with flexion, increasing pain with dressing/ADLs. On exam pt demos good ROM for flexion although this does provoke concordant pain, no pain with extension or rotation but does have ROM limitations. LE strength good overall but is limited on L more so than R (he denies any post CVA deficits). Pt tolerates rotational HEP well, although with trial of seated extension exercises pt demos gradual increase in resting pain, subsequently discontinued which resolves pain. Reports good understanding of modified HEP as above. No adverse events, denies any resting pain on departure. Recommend skilled PT to address relevant deficits with aim of maximizing functional tolerance for ADLs. Pt departs today's session in no acute distress, all voiced questions/concerns addressed appropriately from PT perspective.      OBJECTIVE IMPAIRMENTS: decreased activity tolerance, decreased mobility, decreased ROM, decreased strength, improper body mechanics, and pain.    ACTIVITY LIMITATIONS: bending, squatting, and dressing   PARTICIPATION LIMITATIONS: cleaning and laundry   PERSONAL  FACTORS: Time since onset of injury/illness/exacerbation and 3+ comorbidities: anxiety/schizophrenia, hx CVA and syncope  are also affecting patient's functional outcome.    REHAB POTENTIAL: Good   CLINICAL DECISION MAKING: Stable/uncomplicated   EVALUATION COMPLEXITY: Low     GOALS: Goals reviewed with patient? No   SHORT TERM GOALS: Target date: 02/05/2023 Pt will demonstrate appropriate understanding and performance of initially prescribed HEP in order to facilitate improved independence with management of symptoms.  Baseline: HEP provided on eval 02/06/23: good HEP adherence although reports some irritation with activity  Goal status: MET    2.  Pt will demonstrate at least 50% normal lumbar rotation AROM in order to demonstrate improved tolerance to functional movement patterns.  Baseline: see ROM chart above 02/06/23: 50% to R, 25% to L  Goal status:  ONGOING      LONG TERM GOALS: Target date: 02/26/2023 Pt will score 68 on FOTO in order to demonstrate improved perception of functional status due to symptoms.  Baseline: 63 02/06/23: 57  Goal status: ONGOING   2.  Pt will demonstrate at least 75% normal lumbar rotation AROM in order to demonstrate improved tolerance to functional movement patterns.  Baseline: see MMT chart above 02/06/23: see MMT chart above Goal status: ONGOING   3.  Pt will demonstrate symmetrical hip flexion MMT in order to demonstrate improved strength for functional movements.  Baseline: see MMT chart above 02/06/23: see MMT chart above Goal status: ONGOING   4. Pt will perform 5xSTS in <11 sec in order to demonstrate reduced fall risk and improved functional independence. (MCID of  2.3sec)            Baseline: 13 sec gentle UE support  02/06/23: 15sec gentle UE support             Goal status: ONGOING    5. Pt will demonstrate appropriate performance of final prescribed HEP in order to facilitate improved self-management of symptoms post-discharge.              Baseline: initial HEP prescribed 02/06/23: adherence with HEP reported            Goal status: ONGOING   PLAN:   PT FREQUENCY: 1x/week   PT DURATION: 6 weeks   PLANNED INTERVENTIONS: Therapeutic exercises, Therapeutic activity, Neuromuscular re-education, Balance training, Gait training, Patient/Family education, Self Care, Joint mobilization, Stair training, DME instructions, Aquatic Therapy, Dry Needling, Electrical stimulation, Spinal mobilization, Cryotherapy, Moist heat, Taping, Manual therapy, and Re-evaluation.   PLAN FOR NEXT SESSION: review/update HEP PRN. Progress lumbar mobility as able/appropriate, work on lumbopelvic stability, LE strengthening. Gradually build more tolerance to flexion    Ashley Murrain PT, DPT 02/06/2023 12:10 PM

## 2023-02-06 ENCOUNTER — Ambulatory Visit: Payer: Commercial Managed Care - HMO | Attending: Nurse Practitioner | Admitting: Physical Therapy

## 2023-02-06 ENCOUNTER — Encounter: Payer: Self-pay | Admitting: Physical Therapy

## 2023-02-06 DIAGNOSIS — M6281 Muscle weakness (generalized): Secondary | ICD-10-CM | POA: Diagnosis present

## 2023-02-06 DIAGNOSIS — M5459 Other low back pain: Secondary | ICD-10-CM | POA: Insufficient documentation

## 2023-02-07 LAB — HCV FIBROSURE
ALPHA 2-MACROGLOBULINS, QN: 303 mg/dL — ABNORMAL HIGH (ref 110–276)
ALT (SGPT) P5P: 42 IU/L (ref 0–55)
Apolipoprotein A-1: 73 mg/dL — ABNORMAL LOW (ref 101–178)
Bilirubin, Total: 0.6 mg/dL (ref 0.0–1.2)
Fibrosis Score: 0.95 — ABNORMAL HIGH (ref 0.00–0.21)
GGT: 71 IU/L — ABNORMAL HIGH (ref 0–65)
Haptoglobin: <10 mg/dL — ABNORMAL LOW (ref 32–363)
Necroinflammat Activity Score: 0.45 — ABNORMAL HIGH (ref 0.00–0.17)

## 2023-02-10 LAB — HCV RNA,QN,PCR W/REFL TO GENOTYPE, LIPA(R): HCV RNA, PCR, QN (Log): 5.55 log IU/mL — ABNORMAL HIGH

## 2023-02-11 ENCOUNTER — Telehealth: Payer: Self-pay

## 2023-02-11 ENCOUNTER — Other Ambulatory Visit (HOSPITAL_COMMUNITY): Payer: Self-pay

## 2023-02-11 NOTE — Telephone Encounter (Signed)
FYI

## 2023-02-11 NOTE — Telephone Encounter (Signed)
Looks like elastography is still pending/scheduled for 5/30, so we will need to wait on that before Dr. Daiva Eves / Dr. Renold Don make final recommendation on treatment duration. Thanks Lupita Leash!

## 2023-02-11 NOTE — Therapy (Signed)
OUTPATIENT PHYSICAL THERAPY TREATMENT NOTE   Patient Name: Curtis Mcdonald MRN: [id number] DOB:09-Aug-1956, 67 y.o., male Today's Date: 02/12/2023  PCP: Claiborne Rigg, NP   REFERRING PROVIDER: Claiborne Rigg, NP    END OF SESSION:   PT End of Session - 02/12/23 1053     Visit Number 5    Number of Visits 7    Date for PT Re-Evaluation 03/12/23    Authorization Type [employer]/M[employer] secondary    Authorization Time Period 6 visits 02/11/23-03/24/23    Authorization - Visit Number 1    Authorization - Number of Visits 6    PT Start Time 1057    PT Stop Time 1138    PT Time Calculation (min) 41 min    Activity Tolerance Patient tolerated treatment well;No increased pain    Behavior During Therapy [facility] for tasks assessed/performed                Past Medical History:  Diagnosis Date   Anxiety    Chronic hepatitis C without hepatic coma (HCC) 02/04/2023   Paranoid schizophrenia (HCC)    Stroke (HCC) 2017   Past Surgical History:  Procedure Laterality Date   COLONOSCOPY  2013   WISDOM TOOTH EXTRACTION     Patient Active Problem List   Diagnosis Date Noted   Chronic hepatitis C without hepatic coma (HCC) 02/04/2023   Schizophrenia, unspecified (HCC) 03/02/2020   Generalized anxiety disorder 02/22/2020   LOC (loss of consciousness) (HCC)    Intracranial hemorrhage (HCC) 04/11/2016   Nasal bone fracture 04/11/2016   Syncope 04/11/2016   Syncope and collapse 04/11/2016   Lumbar back pain 04/07/2012    REFERRING DIAG: M54.50 (ICD-10-CM) - Lumbar back pain  THERAPY DIAG:  Other low back pain  Muscle weakness (generalized)  Rationale for Evaluation and Treatment Rehabilitation  PERTINENT HISTORY:  ICH, syncope, schizophrenia, anxiety (pt states these are all well controlled) PRECAUTIONS: hx ICH/syncope, HEP C  SUBJECTIVE:                                                                                                                                                                                       SUBJECTIVE STATEMENT:   Pt arrives w/ report of 6/10 pain, no issues since last session. Feels like modifications were helpful in reducing symptom irritability and HEP is going well. No other new complaints     PAIN:  Are you having pain: 6/10 Location/description: low back, starting midline back moving laterally BIL Worst in past week 7/10  Per eval-  Best-worst over past week: 0-8/10, eases quickly   - aggravating factors: bending, lower body dressing  -  Easing factors: brace, rest/changing positioning   OBJECTIVE: (objective measures completed at initial evaluation unless otherwise dated)   DIAGNOSTIC FINDINGS:  11/24/22 Lumbar XR: IMPRESSION: Minimal degenerative endplate changes in the lumbar spine.   PATIENT SURVEYS:  FOTO 63 current, 68 predicted 02/06/23: 57    SCREENING FOR RED FLAGS: Red flag questioning/screening reassuring     COGNITION: Overall cognitive status: Within functional limits for tasks assessed                          SENSATION: WFL BIL LE     POSTURE: forward head, increased lordosis   PALPATION: Concordant tenderness BIL distal QL and SIJ, unable to assess further as pt has back brace donned on arrival   LUMBAR ROM:    AROM eval 02/06/23 AROM  Flexion 100% * (touches ankles)   Extension 50%   Right lateral flexion Lateral knee *     Left lateral flexion Lateral knee *   Right rotation 25% s 50% s  Left rotation 25% s 25% s   (Blank rows = not tested) (Key: WFL = within functional limits not formally assessed, * = concordant pain, s = stiffness/stretching sensation, NT = not tested)    LOWER EXTREMITY ROM:      Active  Right eval Left eval  Hip flexion      Hip extension      Hip internal rotation      Hip external rotation      Knee extension      Knee flexion      (Blank rows = not tested) (Key: WFL = within functional limits not formally assessed, * = concordant pain, s =  stiffness/stretching sensation, NT = not tested)  Comments:     LOWER EXTREMITY MMT:     MMT Right eval Left eval R/L 02/06/23  Hip flexion 4+ 4- 4+/4 painless BIL  Hip abduction (modified sitting) 5 5   Hip internal rotation       Hip external rotation       Knee flexion 4+ 4+   Knee extension 4+ 4   Ankle dorsiflexion 5 5     (Blank rows = not tested) (Key: WFL = within functional limits not formally assessed, * = concordant pain, s = stiffness/stretching sensation, NT = not tested)  Comments: all MMT painless      FUNCTIONAL TESTS:  5xSTS: 13.12sec gentle UE support, no increase in pain 02/06/23: 15.47sec gentle UE support no increase in pain   GAIT: Distance walked: within clinic Assistive device utilized: None Level of assistance: Complete Independence Comments: widened BOS, reduced gait speed/cadence  Vitals 02/06/23: HR 89-90, SpO2 97-98% RA, BP 130/75   TODAY'S TREATMENT:                                                                                                                              [facility] Adult PT Treatment:  DATE: 02/12/23 Therapeutic Exercise: LTR x10 BIL cues for comfortable ROM  SKTC 3x30sec BIL Seated adduction iso w ball 2x15 cues for posture and breath control  Seated marches 2x8 BIL LE cues for reduced truncal compensations, control Standing hip 45 deg kickbacks 3x8 BIL LE cues for posture Heel raises 2x8 cues for form and reduced compensations Hip abduction GTB 2x15 seated cues for pacing Standing hamstring curl 2x10 cues for form and reduced knee ROM  RTB seated paloff press x10 each way RTB seated paloff press + OH press 2x8 cues for form and posture  Seated gastroc stretch with strap x5 each LE cues for appropriate form Seated swiss ball press down 3x5 cues for breath control and appropriate form, emphasis on avoiding valsalva HEP update + handout, provided w/ RTB and educated on set up for paloff  press   [facility] Adult PT Treatment:                                                DATE: 02/06/23 Therapeutic Exercise: Nu step L5 UE/LE 5 min during subjective LTR x8 BIL cues for comfortable ROM  SKTC x3 BIL, 30sec hold cues for appropriate ROM and breath control  PPT 2x10 cues for breath control  RTB seated paloff press 2x10 each way cues for posture and form  Seated adductor iso 2x12 cues for upright posture and breath control  Seated GTB hip abduction 2x12 cues for form Verbal HEP review and education  Therapeutic Activity: MSK assessment + education  5xSTS + education FOTO + education  [facility] Adult PT Treatment:                                                DATE: 01/31/23 Therapeutic Exercise: Nu step L5 LE/UE during subjective Supine LTR x8 BIL cues for comfortable ROM and breath control Supine SKTC 3x30sec BIL cues for breath control  Bridge 2x10 cues for reduced compensations  Seated adductor iso 2x12 cues for posture and breath control  Seated march x10 BIL cues for breath control and reduced trunk lean  CC paloff press 3# x8 BIL cues for posture  Seated swiss ball flexion x10 cues for comfortable ROM  Seated hamstring curl 2x30sec BIL LE  Seated GTB hip abduction 2x10 cues for posture HEP update + education    [facility] Adult PT Treatment:                                                DATE: 01/25/23 Therapeutic Exercise: Nustep L4 UE/LE x 5 minutes  Seated trunk rotation x 10 Supine LTR within comfortable ROM SKTC  10 sec x 4 each  PPT 3 sec 10 x 2  Side hip abduction x 10 each  SLR x 10 each with ab brace  Supine clam , green band with ab brace x 10 Hooklying ball squeeze with ab brace  x 10      PATIENT EDUCATION:  Education details: rationale for interventions Person educated: Patient Education method: Explanation, Demonstration, Tactile cues, Verbal cues, and Handouts Education comprehension: verbalized understanding, returned demonstration, verbal  cues  required, tactile cues required, and needs further education     HOME EXERCISE PROGRAM: Access Code: [email or web address] URL: [email or web address] Date: 02/12/2023 Prepared by: Fransisco Hertz  Exercises - Pelvic tilt  - 1 x daily - 7 x weekly - 1-2 sets - 10 reps - 5 hold - Supine Lower Trunk Rotation  - 1 x daily - 7 x weekly - 3 sets - 8 reps - Seated Hip Abduction with Resistance  - 1 x daily - 7 x weekly - 3 sets - 10 reps - Seated Anti-Rotation Press With Anchored Resistance  - 1 x daily - 7 x weekly - 3 sets - 10 reps    ASSESSMENT:   CLINICAL IMPRESSION: 02/12/2023 Pt arrives w/ 6/10 pain although he notes that modifications last session were helpful in reducing symptom irritability. Today focusing on gentle progression of volume with seated activities and progression back to more standing activities with emphasis on posture and pacing. No adverse events, pt reports gradual improvement in symptoms as session goes on, reports 2-3/10 pain on departure. Recommend continuing along current POC in order to address relevant deficits and improve functional tolerance. Pt departs today's session in no acute distress, all voiced questions/concerns addressed appropriately from PT perspective.    EVAL: Pt reports pain initially occurred in 2017, resolved but returned about 6 months ago without any recalled change in activity or MOI. Pt reports his primary limitation is transient pain with flexion, increasing pain with dressing/ADLs. On exam pt demos good ROM for flexion although this does provoke concordant pain, no pain with extension or rotation but does have ROM limitations. LE strength good overall but is limited on L more so than R (he denies any post CVA deficits). Pt tolerates rotational HEP well, although with trial of seated extension exercises pt demos gradual increase in resting pain, subsequently discontinued which resolves pain. Reports good understanding of modified HEP as above. No  adverse events, denies any resting pain on departure. Recommend skilled PT to address relevant deficits with aim of maximizing functional tolerance for ADLs. Pt departs today's session in no acute distress, all voiced questions/concerns addressed appropriately from PT perspective.      OBJECTIVE IMPAIRMENTS: decreased activity tolerance, decreased mobility, decreased ROM, decreased strength, improper body mechanics, and pain.    ACTIVITY LIMITATIONS: bending, squatting, and dressing   PARTICIPATION LIMITATIONS: cleaning and laundry   PERSONAL FACTORS: Time since onset of injury/illness/exacerbation and 3+ comorbidities: anxiety/schizophrenia, hx CVA and syncope  are also affecting patient's functional outcome.    REHAB POTENTIAL: Good   CLINICAL DECISION MAKING: Stable/uncomplicated   EVALUATION COMPLEXITY: Low     GOALS: Goals reviewed with patient? No   SHORT TERM GOALS: Target date: 02/05/2023 Pt will demonstrate appropriate understanding and performance of initially prescribed HEP in order to facilitate improved independence with management of symptoms.  Baseline: HEP provided on eval 02/06/23: good HEP adherence although reports some irritation with activity  Goal status: MET    2.  Pt will demonstrate at least 50% normal lumbar rotation AROM in order to demonstrate improved tolerance to functional movement patterns.  Baseline: see ROM chart above 02/06/23: 50% to R, 25% to L  Goal status:  ONGOING      LONG TERM GOALS: Target date: 02/26/2023 Pt will score 68 on FOTO in order to demonstrate improved perception of functional status due to symptoms.  Baseline: 63 02/06/23: 57  Goal status: ONGOING   2.  Pt will demonstrate at least 75% normal lumbar  rotation AROM in order to demonstrate improved tolerance to functional movement patterns.  Baseline: see MMT chart above 02/06/23: see MMT chart above Goal status: ONGOING   3.  Pt will demonstrate symmetrical hip flexion MMT in order  to demonstrate improved strength for functional movements.  Baseline: see MMT chart above 02/06/23: see MMT chart above Goal status: ONGOING   4. Pt will perform 5xSTS in <11 sec in order to demonstrate reduced fall risk and improved functional independence. (MCID of 2.3sec)            Baseline: 13 sec gentle UE support  02/06/23: 15sec gentle UE support             Goal status: ONGOING    5. Pt will demonstrate appropriate performance of final prescribed HEP in order to facilitate improved self-management of symptoms post-discharge.             Baseline: initial HEP prescribed 02/06/23: adherence with HEP reported            Goal status: ONGOING   PLAN:   PT FREQUENCY: 1x/week   PT DURATION: 6 weeks   PLANNED INTERVENTIONS: Therapeutic exercises, Therapeutic activity, Neuromuscular re-education, Balance training, Gait training, Patient/Family education, Self Care, Joint mobilization, Stair training, DME instructions, Aquatic Therapy, Dry Needling, Electrical stimulation, Spinal mobilization, Cryotherapy, Moist heat, Taping, Manual therapy, and Re-evaluation.   PLAN FOR NEXT SESSION: review/update HEP PRN. Progress lumbar mobility as able/appropriate, work on lumbopelvic stability, LE strengthening. Gradually build more tolerance to flexion    Ashley Murrain PT, DPT 02/12/2023 12:40 PM

## 2023-02-11 NOTE — Telephone Encounter (Signed)
[facility] Patient Advocate Encounter   Received notification from [employer] that prior authorization for EPCL[facility] is required.   PA submitted on 02/11/23 Key [id number] Status is pending    [facility] will continue to follow.   [name], CPhT [facility] Phone: [phone] Fax:  [phone]

## 2023-02-12 ENCOUNTER — Encounter: Payer: Self-pay | Admitting: Physical Therapy

## 2023-02-12 ENCOUNTER — Ambulatory Visit: Payer: Commercial Managed Care - HMO | Admitting: Physical Therapy

## 2023-02-12 DIAGNOSIS — M5459 Other low back pain: Secondary | ICD-10-CM | POA: Diagnosis not present

## 2023-02-12 DIAGNOSIS — M6281 Muscle weakness (generalized): Secondary | ICD-10-CM

## 2023-02-12 LAB — HIV ANTIBODY (ROUTINE TESTING W REFLEX): HIV 1&2 Ab, 4th Generation: NONREACTIVE

## 2023-02-12 LAB — HEPATITIS A ANTIBODY, TOTAL: Hepatitis A AB,Total: REACTIVE — AB

## 2023-02-12 LAB — HEPATITIS C GENOTYPE

## 2023-02-12 LAB — HEPATITIS C AB W/RFL RNA, PCR + GENO: Hepatitis C Ab: REACTIVE — AB

## 2023-02-12 LAB — HCV RNA,QN,PCR W/REFL TO GENOTYPE, LIPA(R): HCV RNA, PCR, QN: 358000 IU/mL — ABNORMAL HIGH

## 2023-02-21 ENCOUNTER — Ambulatory Visit (HOSPITAL_COMMUNITY): Payer: Commercial Managed Care - HMO | Admitting: Student

## 2023-02-21 ENCOUNTER — Telehealth (HOSPITAL_COMMUNITY): Payer: Self-pay | Admitting: *Deleted

## 2023-02-21 VITALS — BP 145/85 | HR 93 | Ht 69.0 in | Wt 180.0 lb

## 2023-02-21 DIAGNOSIS — F411 Generalized anxiety disorder: Secondary | ICD-10-CM

## 2023-02-21 DIAGNOSIS — F200 Paranoid schizophrenia: Secondary | ICD-10-CM

## 2023-02-21 DIAGNOSIS — G250 Essential tremor: Secondary | ICD-10-CM | POA: Insufficient documentation

## 2023-02-21 MED ORDER — HYDROXYZINE HCL 25 MG PO TABS
25.0000 mg | ORAL_TABLET | Freq: Every day | ORAL | 1 refills | Status: DC
Start: 2023-02-21 — End: 2023-04-05

## 2023-02-21 MED ORDER — LURASIDONE HCL 40 MG PO TABS
40.0000 mg | ORAL_TABLET | Freq: Every day | ORAL | 1 refills | Status: DC
Start: 2023-02-21 — End: 2023-04-05

## 2023-02-21 MED ORDER — BUSPIRONE HCL 10 MG PO TABS
10.0000 mg | ORAL_TABLET | Freq: Three times a day (TID) | ORAL | 1 refills | Status: DC
Start: 2023-02-21 — End: 2023-04-05

## 2023-02-21 MED ORDER — TRAZODONE HCL 300 MG PO TABS
300.0000 mg | ORAL_TABLET | Freq: Every evening | ORAL | 2 refills | Status: DC
Start: 2023-02-21 — End: 2023-04-05

## 2023-02-21 MED ORDER — PROPRANOLOL HCL 10 MG PO TABS
10.0000 mg | ORAL_TABLET | Freq: Two times a day (BID) | ORAL | 0 refills | Status: DC | PRN
Start: 2023-02-21 — End: 2023-04-05

## 2023-02-21 NOTE — Telephone Encounter (Signed)
Patient called asking what medication was prescribed to him today during his visit and wondering when he can pick it up. States he was a beta-blocker. Asking for the MD to call him to discuss what it was and how it works.

## 2023-02-22 NOTE — Progress Notes (Addendum)
BH MD Outpatient Progress Note  02/22/2023 9:16 AM Curtis Mcdonald  MRN:  [id number]  Assessment:  Curtis Mcdonald presents for follow-up evaluation. Today, 02/22/23, patient reports that he is doing well.  He reports occasional auditory hallucinations that are not particularly bothersome.  As with previous visits, the patient states that he is very satisfied with his current dose of Latuda and does not want a change.   Identifying Information: Curtis Mcdonald is a 67 y.o. male with a history of schizophrenia and anxiety who is an established patient with [facility] for treatment of schizophrenia.  Plan:  # Schizophrenia  Anxiety Past medication trials: unknown Status of problem: stable Interventions: -- Continue Latuda 40 mg daily - Aims of 0 on 1/25, 4/18 - EKG October 2023 with QTc of 483 -Lipids unremarkable in February 2023 -- Continue Buspar 10 mg TID -- Continue hydroxyzine at decreased dose of 25 mg daily (decreased on 4/18)   # Sleep Past medication trials: unknown Status of problem: stable Interventions: -- Continue trazodone 300 mg nightly  # Tremor, most likely essential tremor Past medication trials: none Interventions:  -- Start propranolol 10 mg twice daily as needed tremor - Discussed with the patient the risk of orthostasis and he expressed understanding.  Patient was given contact information for behavioral health clinic and was instructed to call 911 for emergencies.   Subjective:  Chief Complaint:  Chief Complaint  Patient presents with   Follow-up    Interval History:  The patient reports doing well and states that he occasionally experiences auditory hallucinations and sensations of paranoia (that someone is out to get him).  He says that these episodes are transitory and not particularly bothersome.  He declines any change in medication.  He states that he receives enjoyment from reading the Bible and watching the TV during the  day.  He reports that his mood has been "good".  He reports talking with family on the phone and in general feels his life is going well.  During this visit we discussed the patient's tremor.  It is noted to be a rhythmic movement of his right hand (with his left hand affected to a lesser degree).  The tremor is made worse with goal-directed motion, such as finger-nose testing.  He reports difficulty picking up glasses and drinking water. The patient has no difficulty writing his name or drawing a spiral.  There is no rigidity or bradykinesia noted.  Denies knowledge of family history of tremor. Does not drink etoh to know if tremor lessens with etoh use. Feel that the patient's tremor is consistent with essential tremor.  We discussed the treatment as listed above and the patient was agreeable.  Visit Diagnosis:    ICD-10-CM   1. Paranoid schizophrenia (HCC)  F20.0 lurasidone (LATUDA) 40 MG TABS tablet    trazodone (DESYREL) 300 MG tablet    2. Essential tremor  G25.0 propranolol (INDERAL) 10 MG tablet    3. Generalized anxiety disorder  F41.1 busPIRone (BUSPAR) 10 MG tablet    hydrOXYzine (ATARAX) 25 MG tablet      Past Psychiatric History:  Diagnoses: schizophrenia, anxiety, cocaine abuse  Medication trials: unknown Hospitalizations: denies Suicide attempts: denies Hx of abuse: denies Substance use:   -- Denies use of cannabis, cocaine (last used in 2004), or illicit drug use  -- Etoh: denies  -- Tobacco: denies  -- Caffeine: 1 cup once a week  Past Medical History:  Past Medical History:  Diagnosis Date   Anxiety    Chronic hepatitis C without hepatic coma (HCC) 02/04/2023   Paranoid schizophrenia (HCC)    Stroke (HCC) 2017    Past Surgical History:  Procedure Laterality Date   COLONOSCOPY  2013   WISDOM TOOTH EXTRACTION      Family Psychiatric History: denies  Family History:  Family History  Problem Relation Age of Onset   CAD Neg Hx    Diabetes Neg Hx    Colon  cancer Neg Hx    Colon polyps Neg Hx    Esophageal cancer Neg Hx    Rectal cancer Neg Hx    Stomach cancer Neg Hx     Social History:  Social History   Socioeconomic History   Marital status: Single    Spouse name: Not on file   Number of children: Not on file   Years of education: Not on file   Highest education level: GED or equivalent  Occupational History   Occupation: Atm assembly    Comment: [employer]  Tobacco Use   Smoking status: Never   Smokeless tobacco: Never  Vaping Use   Vaping Use: Never used  Substance and Sexual Activity   Alcohol use: No    Comment: fomer    Drug use: No   Sexual activity: Not Currently  Other Topics Concern   Not on file  Social History Narrative   Not on file   Social Determinants of Health   Financial Resource Strain: Low Risk  (12/29/2022)   Overall Financial Resource Strain (CARDIA)    Difficulty of Paying Living Expenses: Not hard at all  Food Insecurity: No Food Insecurity (12/29/2022)   Hunger Vital Sign    Worried About Running Out of Food in the Last Year: Never true    Ran Out of Food in the Last Year: Never true  Transportation Needs: Unknown (12/29/2022)   PRAPARE - Administrator, Civil Service (Medical): Not on file    Lack of Transportation (Non-Medical): No  Physical Activity: Unknown (12/29/2022)   Exercise Vital Sign    Days of Exercise per Week: Patient declined    Minutes of Exercise per Session: Not on file  Stress: Stress Concern Present (12/29/2022)   [employer] of Occupational Health - Occupational Stress Questionnaire    Feeling of Stress : To some extent  Social Connections: Moderately Isolated (12/29/2022)   Social Connection and Isolation Panel [NHANES]    Frequency of Communication with Friends and Family: Never    Frequency of Social Gatherings with Friends and Family: Never    Attends Religious Services: More than 4 times per year    Active Member of [employer] or Organizations: Yes     Attends Engineer, structural: More than 4 times per year    Marital Status: Never married    Allergies: No Known Allergies  Current Medications: Current Outpatient Medications  Medication Sig Dispense Refill   propranolol (INDERAL) 10 MG tablet Take 1 tablet (10 mg total) by mouth 2 (two) times daily as needed. 60 tablet 0   busPIRone (BUSPAR) 10 MG tablet Take 1 tablet (10 mg total) by mouth 3 (three) times daily. 180 tablet 1   Elastic Bandages & Supports (LUMBAR BACK BRACE/SUPPORT PAD) MISC 1 each by Does not apply route daily. PLEASE FAX TO [phone] along with demographics and insurance information 1 each 0   hydrOXYzine (ATARAX) 25 MG tablet Take 1 tablet (25 mg total) by mouth daily. 90 tablet  1   lurasidone (LATUDA) 40 MG TABS tablet Take 1 tablet (40 mg total) by mouth daily with breakfast. 90 tablet 1   meloxicam (MOBIC) 7.5 MG tablet Take 1-2 tablets (7.5-15 mg total) by mouth daily. 60 tablet 0   trazodone (DESYREL) 300 MG tablet Take 1 tablet (300 mg total) by mouth at bedtime. 30 tablet 2   Vitamin D, Ergocalciferol, (DRISDOL) 1.25 MG (50000 UNIT) CAPS capsule Take 1 capsule (50,000 Units total) by mouth every 7 (seven) days. Please fill as a 90 day supply 12 capsule 0   No current facility-administered medications for this visit.     Objective:  Psychiatric Specialty Exam: Blood pressure (!) 145/85, pulse 93, height 5\' 9"  (1.753 m), weight 180 lb (81.6 kg).Body mass index is 26.58 kg/m.  General Appearance: Casual and Fairly Groomed  Eye Contact:  Good  Speech:  Clear and Coherent and Normal Rate  Volume:  Normal  Mood:   "good"  Affect:  Constricted and euthymic  Thought Content: occasional and fleeting AH and paranoia  Suicidal Thoughts:  No  Homicidal Thoughts:  No  Thought Process:  Goal Directed and Linear  Orientation:  Full (Time, Place, and Person)    Memory:   Grossly intact  Judgment:  Good  Insight:  Good  Concentration:  Concentration:  Good  Recall:  NA  Fund of Knowledge: Good  Language: Good  Psychomotor Activity: bilateral hand tremor (R hand worse than L)  Akathisia:  No  AIMS (if indicated): see above  Assets:  Communication Skills Desire for Improvement Housing Leisure Time Physical Health Transportation  ADL's:  Intact  Cognition: WNL  Sleep:  Good   Physical Exam Constitutional:      Appearance: the patient is not toxic-appearing.  Pulmonary:     Effort: Pulmonary effort is normal.  Neurological:     General: Bilateral hand tremor present at rest worse on right compared to left; worsened with intention of finger to nose testing. Remains when arms extended against gravity. No micrographia or difficulty with spiral drawing. No cogwheeling, stiffness, or rigidity of upper extremities.    Mental Status: the patient is alert and oriented to person, place, and time.   Review of Systems  Respiratory:  Negative for shortness of breath.   Cardiovascular:  Negative for chest pain.  Gastrointestinal:  Negative for abdominal pain, constipation, diarrhea, nausea and vomiting.  Neurological:  Negative for headaches.    Metabolic Disorder Labs: Lab Results  Component Value Date   HGBA1C 5.1 04/12/2016   MPG 100 04/12/2016   No results found for: "PROLACTIN" Lab Results  Component Value Date   CHOL 110 11/20/2022   TRIG 82 11/20/2022   HDL 33 (L) 11/20/2022   CHOLHDL 3.3 11/20/2022   VLDL 14 04/12/2016   LDLCALC 60 11/20/2022   LDLCALC 84 04/12/2016   Lab Results  Component Value Date   TSH 0.330 (L) 04/12/2016    Therapeutic Level Labs: No results found for: "LITHIUM" No results found for: "VALPROATE" No results found for: "CBMZ"  Screenings:  AIMS    Flowsheet Row Clinical Support from 10/06/2020 in [facility]  AIMS Total Score 0      GAD-7    Flowsheet Row Office Visit from 01/02/2023 in [facility] Office Visit from  11/20/2022 in [facility] Video Visit from 05/11/2022 in [facility] Office Visit from 02/01/2022 in [facility] Clinical Support from 08/23/2021 in [facility]  Total GAD-7 Score 6 10 9 6 1       PHQ2-9    Flowsheet Row Office Visit from 02/04/2023 in [facility] Office Visit from 01/02/2023 in [facility] Office Visit from 11/20/2022 in [facility] Video Visit from 05/11/2022 in [facility] Office Visit from 02/01/2022 in [facility]  PHQ-2 Total Score 1 3 0 2 0  PHQ-9 Total Score -- 5 0 3 --      Flowsheet Row ED from 05/13/2022 in [facility] Office Visit from 02/01/2022 in [facility] Clinical Support from 02/20/2021 in [facility]  C-SSRS RISK CATEGORY No Risk No Risk No Risk       Collaboration of Care: none   I provided 30 minutes of face-to-face time during this encounter.  Carlyn Reichert, MD 02/22/2023, 9:16 AM    I saw and evaluated the patient. I discussed the case with the resident and agree with the findings and plan as documented in the resident's note and above addendum.   Daine Gip, MD 02/22/23

## 2023-02-27 ENCOUNTER — Encounter: Payer: Self-pay | Admitting: Physical Therapy

## 2023-02-27 ENCOUNTER — Ambulatory Visit: Payer: Commercial Managed Care - HMO | Admitting: Physical Therapy

## 2023-02-27 DIAGNOSIS — M5459 Other low back pain: Secondary | ICD-10-CM | POA: Diagnosis not present

## 2023-02-27 DIAGNOSIS — M6281 Muscle weakness (generalized): Secondary | ICD-10-CM

## 2023-02-27 NOTE — Therapy (Signed)
OUTPATIENT PHYSICAL THERAPY TREATMENT NOTE   Patient Name: Curtis Mcdonald MRN: [id number] DOB:September 05, 1956, 67 y.o., male Today's Date: 02/27/2023  PCP: Claiborne Rigg, NP   REFERRING PROVIDER: Claiborne Rigg, NP    END OF SESSION:   PT End of Session - 02/27/23 1200     Visit Number 6    Number of Visits 7    Date for PT Re-Evaluation 03/12/23    Authorization Type [employer]/MCD secondary    Authorization Time Period 6 visits 02/11/23-03/24/23    Authorization - Visit Number 2    Authorization - Number of Visits 6                Past Medical History:  Diagnosis Date   Anxiety    Chronic hepatitis C without hepatic coma (HCC) 02/04/2023   Paranoid schizophrenia (HCC)    Stroke (HCC) 2017   Past Surgical History:  Procedure Laterality Date   COLONOSCOPY  2013   WISDOM TOOTH EXTRACTION     Patient Active Problem List   Diagnosis Date Noted   Essential tremor 02/21/2023   Chronic hepatitis C without hepatic coma (HCC) 02/04/2023   Schizophrenia, unspecified (HCC) 03/02/2020   Generalized anxiety disorder 02/22/2020   LOC (loss of consciousness) (HCC)    Intracranial hemorrhage (HCC) 04/11/2016   Nasal bone fracture 04/11/2016   Syncope 04/11/2016   Syncope and collapse 04/11/2016   Lumbar back pain 04/07/2012    REFERRING DIAG: M54.50 (ICD-10-CM) - Lumbar back pain  THERAPY DIAG:  Other low back pain  Muscle weakness (generalized)  Rationale for Evaluation and Treatment Rehabilitation  PERTINENT HISTORY:  ICH, syncope, schizophrenia, anxiety (pt states these are all well controlled) PRECAUTIONS: hx ICH/syncope, HEP C  SUBJECTIVE:                                                                                                                                                                                      SUBJECTIVE STATEMENT:   Pt arrives w/ report of 6/10 pain, no issues since last session. Feels like back is getting there.    PAIN:  Are you  having pain: 6/10 Location/description: low back, starting midline back moving laterally BIL Worst in past week 7/10  Per eval-  Best-worst over past week: 0-8/10, eases quickly   - aggravating factors: bending, lower body dressing  - Easing factors: brace, rest/changing positioning   OBJECTIVE: (objective measures completed at initial evaluation unless otherwise dated)   DIAGNOSTIC FINDINGS:  11/24/22 Lumbar XR: IMPRESSION: Minimal degenerative endplate changes in the lumbar spine.   PATIENT SURVEYS:  FOTO 63 current, 68 predicted 02/06/23: 57    SCREENING FOR RED  FLAGS: Red flag questioning/screening reassuring     COGNITION: Overall cognitive status: Within functional limits for tasks assessed                          SENSATION: WFL BIL LE     POSTURE: forward head, increased lordosis   PALPATION: Concordant tenderness BIL distal QL and SIJ, unable to assess further as pt has back brace donned on arrival   LUMBAR ROM:    AROM eval 02/06/23 AROM 02/27/23 AROM  Flexion 100% * (touches ankles)    Extension 50%    Right lateral flexion Lateral knee *      Left lateral flexion Lateral knee *    Right rotation 25% s 50% s 50%*  Left rotation 25% s 25% s 50%   (Blank rows = not tested) (Key: WFL = within functional limits not formally assessed, * = concordant pain, s = stiffness/stretching sensation, NT = not tested)    LOWER EXTREMITY ROM:      Active  Right eval Left eval  Hip flexion      Hip extension      Hip internal rotation      Hip external rotation      Knee extension      Knee flexion      (Blank rows = not tested) (Key: WFL = within functional limits not formally assessed, * = concordant pain, s = stiffness/stretching sensation, NT = not tested)  Comments:     LOWER EXTREMITY MMT:     MMT Right eval Left eval R/L 02/06/23 R/L 02/27/23  Hip flexion 4+ 4- 4+/4 painless BIL 5 / 4+ painless  Hip abduction (modified sitting) 5 5    Hip internal  rotation        Hip external rotation        Knee flexion 4+ 4+    Knee extension 4+ 4    Ankle dorsiflexion 5 5      (Blank rows = not tested) (Key: WFL = within functional limits not formally assessed, * = concordant pain, s = stiffness/stretching sensation, NT = not tested)  Comments: all MMT painless      FUNCTIONAL TESTS:  5xSTS: 13.12sec gentle UE support, no increase in pain 02/06/23: 15.47sec gentle UE support no increase in pain 02/27/23: hands on thighs : 14.7 sec   GAIT: Distance walked: within clinic Assistive device utilized: None Level of assistance: Complete Independence Comments: widened BOS, reduced gait speed/cadence  Vitals 02/06/23: HR 89-90, SpO2 97-98% RA, BP 130/75   TODAY'S TREATMENT:                                                                                                                          [facility] Adult PT Treatment:  DATE: 02/27/23 Therapeutic Exercise: LTR  LTR with wide set feet  Supine marching with ab brace Supine clam with green band  SKTC SLR with ab brace 10 x 2 each Seated lumbar flexion Seated lumbar flexion with ball roll out    [facility] Adult PT Treatment:                                                DATE: 02/12/23 Therapeutic Exercise: LTR x10 BIL cues for comfortable ROM  SKTC 3x30sec BIL Seated adduction iso w ball 2x15 cues for posture and breath control  Seated marches 2x8 BIL LE cues for reduced truncal compensations, control Standing hip 45 deg kickbacks 3x8 BIL LE cues for posture Heel raises 2x8 cues for form and reduced compensations Hip abduction GTB 2x15 seated cues for pacing Standing hamstring curl 2x10 cues for form and reduced knee ROM  RTB seated paloff press x10 each way RTB seated paloff press + OH press 2x8 cues for form and posture  Seated gastroc stretch with strap x5 each LE cues for appropriate form Seated swiss ball press down 3x5 cues for breath control and  appropriate form, emphasis on avoiding valsalva HEP update + handout, provided w/ RTB and educated on set up for paloff press   [facility] Adult PT Treatment:                                                DATE: 02/06/23 Therapeutic Exercise: Nu step L5 UE/LE 5 min during subjective LTR x8 BIL cues for comfortable ROM  SKTC x3 BIL, 30sec hold cues for appropriate ROM and breath control  PPT 2x10 cues for breath control  RTB seated paloff press 2x10 each way cues for posture and form  Seated adductor iso 2x12 cues for upright posture and breath control  Seated GTB hip abduction 2x12 cues for form Verbal HEP review and education  Therapeutic Activity: MSK assessment + education  5xSTS + education FOTO + education       PATIENT EDUCATION:  Education details: rationale for interventions Person educated: Patient Education method: Explanation, Demonstration, Tactile cues, Verbal cues, and Handouts Education comprehension: verbalized understanding, returned demonstration, verbal cues required, tactile cues required, and needs further education     HOME EXERCISE PROGRAM: Access Code: [email or web address] URL: [email or web address] Date: 02/12/2023 Prepared by: Fransisco Hertz  Exercises - Pelvic tilt  - 1 x daily - 7 x weekly - 1-2 sets - 10 reps - 5 hold - Supine Lower Trunk Rotation  - 1 x daily - 7 x weekly - 3 sets - 8 reps - Seated Hip Abduction with Resistance  - 1 x daily - 7 x weekly - 3 sets - 10 reps - Seated Anti-Rotation Press With Anchored Resistance  - 1 x daily - 7 x weekly - 3 sets - 10 reps    ASSESSMENT:   CLINICAL IMPRESSION: 02/27/2023 Pt arrives w/ 6/10 pain and reports he is about the same since his last appointment 2 weeks ago. He reports most difficulty with bending over to pick up things or tie his shoes in a seated position, although he does report it is better when attempted in clinic today. Worked on lumbar flexion stretching, lumbar rotation stretching  and hip  flexion strengthening.  Right and left hip flexion MMT have both improved however not yet symmetrical, slightly weaker in Left.  His lumbar rotation has improved to 50% bilateral, meeting a STG. Added lumbar flexion stretch to HEP as he does not return until next week. Asked him to discontinue if he experienced increased pain. He arrived late today therefore session shortened.    EVAL: Pt reports pain initially occurred in 2017, resolved but returned about 6 months ago without any recalled change in activity or MOI. Pt reports his primary limitation is transient pain with flexion, increasing pain with dressing/ADLs. On exam pt demos good ROM for flexion although this does provoke concordant pain, no pain with extension or rotation but does have ROM limitations. LE strength good overall but is limited on L more so than R (he denies any post CVA deficits). Pt tolerates rotational HEP well, although with trial of seated extension exercises pt demos gradual increase in resting pain, subsequently discontinued which resolves pain. Reports good understanding of modified HEP as above. No adverse events, denies any resting pain on departure. Recommend skilled PT to address relevant deficits with aim of maximizing functional tolerance for ADLs. Pt departs today's session in no acute distress, all voiced questions/concerns addressed appropriately from PT perspective.      OBJECTIVE IMPAIRMENTS: decreased activity tolerance, decreased mobility, decreased ROM, decreased strength, improper body mechanics, and pain.    ACTIVITY LIMITATIONS: bending, squatting, and dressing   PARTICIPATION LIMITATIONS: cleaning and laundry   PERSONAL FACTORS: Time since onset of injury/illness/exacerbation and 3+ comorbidities: anxiety/schizophrenia, hx CVA and syncope  are also affecting patient's functional outcome.    REHAB POTENTIAL: Good   CLINICAL DECISION MAKING: Stable/uncomplicated   EVALUATION COMPLEXITY: Low      GOALS: Goals reviewed with patient? No   SHORT TERM GOALS: Target date: 02/05/2023 Pt will demonstrate appropriate understanding and performance of initially prescribed HEP in order to facilitate improved independence with management of symptoms.  Baseline: HEP provided on eval 02/06/23: good HEP adherence although reports some irritation with activity  Goal status: MET    2.  Pt will demonstrate at least 50% normal lumbar rotation AROM in order to demonstrate improved tolerance to functional movement patterns.  Baseline: see ROM chart above 02/06/23: 50% to R, 25% to L  02/27/23: 50% bilateral, pain to right  Goal status:  MET     LONG TERM GOALS: Target date: 02/26/2023 Pt will score 68 on FOTO in order to demonstrate improved perception of functional status due to symptoms.  Baseline: 63 02/06/23: 57  Goal status: ONGOING   2.  Pt will demonstrate at least 75% normal lumbar rotation AROM in order to demonstrate improved tolerance to functional movement patterns.  Baseline: see MMT chart above 02/06/23: see MMT chart above Goal status: ONGOING   3.  Pt will demonstrate symmetrical hip flexion MMT in order to demonstrate improved strength for functional movements.  Baseline: see MMT chart above 02/06/23: see MMT chart above 02/27/23:see MMT chart above Goal status: NEARLY MET   4. Pt will perform 5xSTS in <11 sec in order to demonstrate reduced fall risk and improved functional independence. (MCID of 2.3sec)            Baseline: 13 sec gentle UE support  02/06/23: 15sec gentle UE support   02/27/23: 14.7 sec            Goal status: ONGOING    5. Pt will demonstrate appropriate performance of final  prescribed HEP in order to facilitate improved self-management of symptoms post-discharge.             Baseline: initial HEP prescribed 02/06/23: adherence with HEP reported            Goal status: ONGOING   PLAN:   PT FREQUENCY: 1x/week   PT DURATION: 6 weeks   PLANNED INTERVENTIONS:  Therapeutic exercises, Therapeutic activity, Neuromuscular re-education, Balance training, Gait training, Patient/Family education, Self Care, Joint mobilization, Stair training, DME instructions, Aquatic Therapy, Dry Needling, Electrical stimulation, Spinal mobilization, Cryotherapy, Moist heat, Taping, Manual therapy, and Re-evaluation.   PLAN FOR NEXT SESSION: review/update HEP PRN. Progress lumbar mobility as able/appropriate, work on lumbopelvic stability, LE strengthening. Gradually build more tolerance to flexion    Jannette Spanner, PTA 02/27/23 12:00 PM Phone: [phone] Fax: [phone]

## 2023-03-01 NOTE — Therapy (Signed)
OUTPATIENT PHYSICAL THERAPY TREATMENT NOTE   Patient Name: Curtis Mcdonald MRN: [id number] DOB:26-May-1956, 67 y.o., male Today's Date: 03/01/2023  PCP: Claiborne Rigg, NP   REFERRING PROVIDER: Claiborne Rigg, NP    END OF SESSION:        Past Medical History:  Diagnosis Date   Anxiety    Chronic hepatitis C without hepatic coma (HCC) 02/04/2023   Paranoid schizophrenia (HCC)    Stroke (HCC) 2017   Past Surgical History:  Procedure Laterality Date   COLONOSCOPY  2013   WISDOM TOOTH EXTRACTION     Patient Active Problem List   Diagnosis Date Noted   Essential tremor 02/21/2023   Chronic hepatitis C without hepatic coma (HCC) 02/04/2023   Schizophrenia, unspecified (HCC) 03/02/2020   Generalized anxiety disorder 02/22/2020   LOC (loss of consciousness) (HCC)    Intracranial hemorrhage (HCC) 04/11/2016   Nasal bone fracture 04/11/2016   Syncope 04/11/2016   Syncope and collapse 04/11/2016   Lumbar back pain 04/07/2012    REFERRING DIAG: M54.50 (ICD-10-CM) - Lumbar back pain  THERAPY DIAG:  No diagnosis found.  Rationale for Evaluation and Treatment Rehabilitation  PERTINENT HISTORY:  ICH, syncope, schizophrenia, anxiety (pt states these are all well controlled) PRECAUTIONS: hx ICH/syncope, HEP C  SUBJECTIVE:                                                                                                                                                                                      SUBJECTIVE STATEMENT:   ***  *** Pt arrives w/ report of 6/10 pain, no issues since last session. Feels like back is getting there.    PAIN:  Are you having pain: 6/10 Location/description: low back, starting midline back moving laterally BIL Worst in past week 7/10  Per eval-  Best-worst over past week: 0-8/10, eases quickly   - aggravating factors: bending, lower body dressing  - Easing factors: brace, rest/changing positioning   OBJECTIVE: (objective measures  completed at initial evaluation unless otherwise dated)   DIAGNOSTIC FINDINGS:  11/24/22 Lumbar XR: IMPRESSION: Minimal degenerative endplate changes in the lumbar spine.   PATIENT SURVEYS:  FOTO 63 current, 68 predicted 02/06/23: 57    SCREENING FOR RED FLAGS: Red flag questioning/screening reassuring     COGNITION: Overall cognitive status: Within functional limits for tasks assessed                          SENSATION: WFL BIL LE     POSTURE: forward head, increased lordosis   PALPATION: Concordant tenderness BIL distal QL and SIJ, unable to assess further as pt has  back brace donned on arrival   LUMBAR ROM:    AROM eval 02/06/23 AROM 02/27/23 AROM  Flexion 100% * (touches ankles)    Extension 50%    Right lateral flexion Lateral knee *      Left lateral flexion Lateral knee *    Right rotation 25% s 50% s 50%*  Left rotation 25% s 25% s 50%   (Blank rows = not tested) (Key: WFL = within functional limits not formally assessed, * = concordant pain, s = stiffness/stretching sensation, NT = not tested)    LOWER EXTREMITY ROM:      Active  Right eval Left eval  Hip flexion      Hip extension      Hip internal rotation      Hip external rotation      Knee extension      Knee flexion      (Blank rows = not tested) (Key: WFL = within functional limits not formally assessed, * = concordant pain, s = stiffness/stretching sensation, NT = not tested)  Comments:     LOWER EXTREMITY MMT:     MMT Right eval Left eval R/L 02/06/23 R/L 02/27/23  Hip flexion 4+ 4- 4+/4 painless BIL 5 / 4+ painless  Hip abduction (modified sitting) 5 5    Hip internal rotation        Hip external rotation        Knee flexion 4+ 4+    Knee extension 4+ 4    Ankle dorsiflexion 5 5      (Blank rows = not tested) (Key: WFL = within functional limits not formally assessed, * = concordant pain, s = stiffness/stretching sensation, NT = not tested)  Comments: all MMT painless       FUNCTIONAL TESTS:  5xSTS: 13.12sec gentle UE support, no increase in pain 02/06/23: 15.47sec gentle UE support no increase in pain 02/27/23: hands on thighs : 14.7 sec   GAIT: Distance walked: within clinic Assistive device utilized: None Level of assistance: Complete Independence Comments: widened BOS, reduced gait speed/cadence  Vitals 02/06/23: HR 89-90, SpO2 97-98% RA, BP 130/75   TODAY'S TREATMENT:                                                                                                                         [facility] Adult PT Treatment:                                                DATE: 03/05/23 Therapeutic Exercise: *** Manual Therapy: *** Neuromuscular re-ed: *** Therapeutic Activity: *** Modalities: *** Self Care: [facility] Treatment:  DATE: 02/27/23 Therapeutic Exercise: LTR  LTR with wide set feet  Supine marching with ab brace Supine clam with green band  SKTC SLR with ab brace 10 x 2 each Seated lumbar flexion Seated lumbar flexion with ball roll out    [facility] Adult PT Treatment:                                                DATE: 02/12/23 Therapeutic Exercise: LTR x10 BIL cues for comfortable ROM  SKTC 3x30sec BIL Seated adduction iso w ball 2x15 cues for posture and breath control  Seated marches 2x8 BIL LE cues for reduced truncal compensations, control Standing hip 45 deg kickbacks 3x8 BIL LE cues for posture Heel raises 2x8 cues for form and reduced compensations Hip abduction GTB 2x15 seated cues for pacing Standing hamstring curl 2x10 cues for form and reduced knee ROM  RTB seated paloff press x10 each way RTB seated paloff press + OH press 2x8 cues for form and posture  Seated gastroc stretch with strap x5 each LE cues for appropriate form Seated swiss ball press down 3x5 cues for breath control and appropriate form, emphasis on avoiding valsalva HEP update + handout, provided w/ RTB  and educated on set up for paloff press   [facility] Adult PT Treatment:                                                DATE: 02/06/23 Therapeutic Exercise: Nu step L5 UE/LE 5 min during subjective LTR x8 BIL cues for comfortable ROM  SKTC x3 BIL, 30sec hold cues for appropriate ROM and breath control  PPT 2x10 cues for breath control  RTB seated paloff press 2x10 each way cues for posture and form  Seated adductor iso 2x12 cues for upright posture and breath control  Seated GTB hip abduction 2x12 cues for form Verbal HEP review and education  Therapeutic Activity: MSK assessment + education  5xSTS + education FOTO + education       PATIENT EDUCATION:  Education details: rationale for interventions Person educated: Patient Education method: Explanation, Demonstration, Tactile cues, Verbal cues, and Handouts Education comprehension: verbalized understanding, returned demonstration, verbal cues required, tactile cues required, and needs further education     HOME EXERCISE PROGRAM: Access Code: [email or web address] URL: [email or web address] Date: 02/12/2023 Prepared by: Fransisco Hertz  Exercises - Pelvic tilt  - 1 x daily - 7 x weekly - 1-2 sets - 10 reps - 5 hold - Supine Lower Trunk Rotation  - 1 x daily - 7 x weekly - 3 sets - 8 reps - Seated Hip Abduction with Resistance  - 1 x daily - 7 x weekly - 3 sets - 10 reps - Seated Anti-Rotation Press With Anchored Resistance  - 1 x daily - 7 x weekly - 3 sets - 10 reps    ASSESSMENT:   CLINICAL IMPRESSION: 03/01/2023 ***  *** Pt arrives w/ 6/10 pain and reports he is about the same since his last appointment 2 weeks ago. He reports most difficulty with bending over to pick up things or tie his shoes in a seated position, although he does report it is better when attempted in clinic today. Worked on lumbar flexion stretching,  lumbar rotation stretching and hip flexion strengthening.  Right and left hip flexion MMT have both improved  however not yet symmetrical, slightly weaker in Left.  His lumbar rotation has improved to 50% bilateral, meeting a STG. Added lumbar flexion stretch to HEP as he does not return until next week. Asked him to discontinue if he experienced increased pain. He arrived late today therefore session shortened.    EVAL: Pt reports pain initially occurred in 2017, resolved but returned about 6 months ago without any recalled change in activity or MOI. Pt reports his primary limitation is transient pain with flexion, increasing pain with dressing/ADLs. On exam pt demos good ROM for flexion although this does provoke concordant pain, no pain with extension or rotation but does have ROM limitations. LE strength good overall but is limited on L more so than R (he denies any post CVA deficits). Pt tolerates rotational HEP well, although with trial of seated extension exercises pt demos gradual increase in resting pain, subsequently discontinued which resolves pain. Reports good understanding of modified HEP as above. No adverse events, denies any resting pain on departure. Recommend skilled PT to address relevant deficits with aim of maximizing functional tolerance for ADLs. Pt departs today's session in no acute distress, all voiced questions/concerns addressed appropriately from PT perspective.      OBJECTIVE IMPAIRMENTS: decreased activity tolerance, decreased mobility, decreased ROM, decreased strength, improper body mechanics, and pain.    ACTIVITY LIMITATIONS: bending, squatting, and dressing   PARTICIPATION LIMITATIONS: cleaning and laundry   PERSONAL FACTORS: Time since onset of injury/illness/exacerbation and 3+ comorbidities: anxiety/schizophrenia, hx CVA and syncope  are also affecting patient's functional outcome.    REHAB POTENTIAL: Good   CLINICAL DECISION MAKING: Stable/uncomplicated   EVALUATION COMPLEXITY: Low     GOALS: Goals reviewed with patient? No   SHORT TERM GOALS: Target date:  02/05/2023 Pt will demonstrate appropriate understanding and performance of initially prescribed HEP in order to facilitate improved independence with management of symptoms.  Baseline: HEP provided on eval 02/06/23: good HEP adherence although reports some irritation with activity  Goal status: MET    2.  Pt will demonstrate at least 50% normal lumbar rotation AROM in order to demonstrate improved tolerance to functional movement patterns.  Baseline: see ROM chart above 02/06/23: 50% to R, 25% to L  02/27/23: 50% bilateral, pain to right  Goal status:  MET     LONG TERM GOALS: Target date: 02/26/2023 Pt will score 68 on FOTO in order to demonstrate improved perception of functional status due to symptoms.  Baseline: 63 02/06/23: 57  Goal status: ONGOING   2.  Pt will demonstrate at least 75% normal lumbar rotation AROM in order to demonstrate improved tolerance to functional movement patterns.  Baseline: see MMT chart above 02/06/23: see MMT chart above Goal status: ONGOING   3.  Pt will demonstrate symmetrical hip flexion MMT in order to demonstrate improved strength for functional movements.  Baseline: see MMT chart above 02/06/23: see MMT chart above 02/27/23:see MMT chart above Goal status: NEARLY MET   4. Pt will perform 5xSTS in <11 sec in order to demonstrate reduced fall risk and improved functional independence. (MCID of 2.3sec)            Baseline: 13 sec gentle UE support  02/06/23: 15sec gentle UE support   02/27/23: 14.7 sec            Goal status: ONGOING    5. Pt will demonstrate appropriate  performance of final prescribed HEP in order to facilitate improved self-management of symptoms post-discharge.             Baseline: initial HEP prescribed 02/06/23: adherence with HEP reported            Goal status: ONGOING   PLAN:   PT FREQUENCY: 1x/week   PT DURATION: 6 weeks   PLANNED INTERVENTIONS: Therapeutic exercises, Therapeutic activity, Neuromuscular re-education, Balance  training, Gait training, Patient/Family education, Self Care, Joint mobilization, Stair training, DME instructions, Aquatic Therapy, Dry Needling, Electrical stimulation, Spinal mobilization, Cryotherapy, Moist heat, Taping, Manual therapy, and Re-evaluation.   PLAN FOR NEXT SESSION: review/update HEP PRN. Progress lumbar mobility as able/appropriate, work on lumbopelvic stability, LE strengthening. Gradually build more tolerance to flexion    Ashley Murrain PT, DPT 03/01/2023 10:46 AM

## 2023-03-05 ENCOUNTER — Encounter: Payer: Self-pay | Admitting: Physical Therapy

## 2023-03-05 ENCOUNTER — Ambulatory Visit: Payer: Commercial Managed Care - HMO | Admitting: Physical Therapy

## 2023-03-05 DIAGNOSIS — M6281 Muscle weakness (generalized): Secondary | ICD-10-CM

## 2023-03-05 DIAGNOSIS — M5459 Other low back pain: Secondary | ICD-10-CM | POA: Diagnosis not present

## 2023-03-06 ENCOUNTER — Ambulatory Visit: Payer: Commercial Managed Care - HMO | Admitting: Infectious Disease

## 2023-03-06 ENCOUNTER — Other Ambulatory Visit: Payer: Self-pay

## 2023-03-06 VITALS — BP 122/80 | HR 66 | Temp 98.0°F | Wt 179.4 lb

## 2023-03-06 DIAGNOSIS — Z23 Encounter for immunization: Secondary | ICD-10-CM | POA: Diagnosis not present

## 2023-03-06 DIAGNOSIS — Z7185 Encounter for immunization safety counseling: Secondary | ICD-10-CM

## 2023-03-06 DIAGNOSIS — B182 Chronic viral hepatitis C: Secondary | ICD-10-CM | POA: Diagnosis not present

## 2023-03-06 NOTE — Progress Notes (Signed)
Complaint follow-up for hepatitis C Subjective:    Patient ID: Curtis Mcdonald, male    DOB: [birth year], 67 y.o.   MRN: [id number]  HPI  Curtis Mcdonald is a 67 year old Black man with hx of CVA, schizophrenia was referred to [place] for treatment of his chronic hepatitis C without hepatic coma.  He does not recall having had a blood transfusion but also has not had tattoos he is not sexually active for the last 10 years.  He denies intravenous drug use.  He is tested negative for HIV as well as hepatitis B.  He still needs elastography prior to initiation of a regimen he is scheduled tomorrow in the morning.  He does not have antibodies to hepatitis B but does to hepatitis A.       Past Medical History:  Diagnosis Date   Anxiety    Chronic hepatitis C without hepatic coma (HCC) 02/04/2023   Paranoid schizophrenia (HCC)    Stroke (HCC) 2017    Past Surgical History:  Procedure Laterality Date   COLONOSCOPY  2013   WISDOM TOOTH EXTRACTION      Family History  Problem Relation Age of Onset   CAD Neg Hx    Diabetes Neg Hx    Colon cancer Neg Hx    Colon polyps Neg Hx    Esophageal cancer Neg Hx    Rectal cancer Neg Hx    Stomach cancer Neg Hx       Social History   Socioeconomic History   Marital status: Single    Spouse name: Not on file   Number of children: Not on file   Years of education: Not on file   Highest education level: GED or equivalent  Occupational History   Occupation: Atm assembly    Comment: [employer]  Tobacco Use   Smoking status: Never   Smokeless tobacco: Never  Vaping Use   Vaping Use: Never used  Substance and Sexual Activity   Alcohol use: No    Comment: fomer    Drug use: No   Sexual activity: Not Currently  Other Topics Concern   Not on file  Social History Narrative   Not on file   Social Determinants of Health   Financial Resource Strain: Low Risk  (12/29/2022)   Overall Financial Resource Strain (CARDIA)    Difficulty of Paying  Living Expenses: Not hard at all  Food Insecurity: No Food Insecurity (12/29/2022)   Hunger Vital Sign    Worried About Running Out of Food in the Last Year: Never true    Ran Out of Food in the Last Year: Never true  Transportation Needs: Unknown (12/29/2022)   PRAPARE - Administrator, Civil Service (Medical): Not on file    Lack of Transportation (Non-Medical): No  Physical Activity: Unknown (12/29/2022)   Exercise Vital Sign    Days of Exercise per Week: Patient declined    Minutes of Exercise per Session: Not on file  Stress: Stress Concern Present (12/29/2022)   [employer] of Occupational Health - Occupational Stress Questionnaire    Feeling of Stress : To some extent  Social Connections: Moderately Isolated (12/29/2022)   Social Connection and Isolation Panel [NHANES]    Frequency of Communication with Friends and Family: Never    Frequency of Social Gatherings with Friends and Family: Never    Attends Religious Services: More than 4 times per year    Active Member of Clubs or Organizations: Yes  Attends Engineer, structural: More than 4 times per year    Marital Status: Never married    No Known Allergies   Current Outpatient Medications:    busPIRone (BUSPAR) 10 MG tablet, Take 1 tablet (10 mg total) by mouth 3 (three) times daily., Disp: 180 tablet, Rfl: 1   Elastic Bandages & Supports (LUMBAR BACK BRACE/SUPPORT PAD) MISC, 1 each by Does not apply route daily. PLEASE FAX TO [phone] along with demographics and insurance information, Disp: 1 each, Rfl: 0   hydrOXYzine (ATARAX) 25 MG tablet, Take 1 tablet (25 mg total) by mouth daily., Disp: 90 tablet, Rfl: 1   lurasidone (LATUDA) 40 MG TABS tablet, Take 1 tablet (40 mg total) by mouth daily with breakfast., Disp: 90 tablet, Rfl: 1   meloxicam (MOBIC) 7.5 MG tablet, Take 1-2 tablets (7.5-15 mg total) by mouth daily., Disp: 60 tablet, Rfl: 0   propranolol (INDERAL) 10 MG tablet, Take 1 tablet (10  mg total) by mouth 2 (two) times daily as needed., Disp: 60 tablet, Rfl: 0   trazodone (DESYREL) 300 MG tablet, Take 1 tablet (300 mg total) by mouth at bedtime., Disp: 30 tablet, Rfl: 2   Vitamin D, Ergocalciferol, (DRISDOL) 1.25 MG (50000 UNIT) CAPS capsule, Take 1 capsule (50,000 Units total) by mouth every 7 (seven) days. Please fill as a 90 day supply, Disp: 12 capsule, Rfl: 0   Review of Systems  Constitutional:  Negative for activity change, appetite change, chills, diaphoresis, fatigue, fever and unexpected weight change.  HENT:  Negative for congestion, rhinorrhea, sinus pressure, sneezing, sore throat and trouble swallowing.   Eyes:  Negative for photophobia and visual disturbance.  Respiratory:  Negative for cough, chest tightness, shortness of breath, wheezing and stridor.   Cardiovascular:  Negative for chest pain, palpitations and leg swelling.  Gastrointestinal:  Negative for abdominal distention, abdominal pain, anal bleeding, blood in stool, constipation, diarrhea, nausea and vomiting.  Genitourinary:  Negative for difficulty urinating, dysuria, flank pain and hematuria.  Musculoskeletal:  Negative for arthralgias, back pain, gait problem, joint swelling and myalgias.  Skin:  Negative for color change, pallor, rash and wound.  Neurological:  Negative for dizziness, tremors, weakness and light-headedness.  Hematological:  Negative for adenopathy. Does not bruise/bleed easily.  Psychiatric/Behavioral:  Negative for agitation, behavioral problems, confusion, decreased concentration, dysphoric mood and sleep disturbance.        Objective:   Physical Exam Constitutional:      Appearance: He is well-developed.  HENT:     Head: Normocephalic and atraumatic.  Eyes:     Conjunctiva/sclera: Conjunctivae normal.  Cardiovascular:     Rate and Rhythm: Normal rate and regular rhythm.  Pulmonary:     Effort: Pulmonary effort is normal. No respiratory distress.     Breath sounds: No  wheezing.  Abdominal:     General: There is no distension.     Palpations: Abdomen is soft.  Musculoskeletal:        General: No tenderness. Normal range of motion.     Cervical back: Normal range of motion and neck supple.  Skin:    General: Skin is warm and dry.     Coloration: Skin is not pale.     Findings: No erythema or rash.  Neurological:     General: No focal deficit present.     Mental Status: He is alert and oriented to person, place, and time.  Psychiatric:        Mood and Affect: Mood normal.  Behavior: Behavior normal.        Thought Content: Thought content normal.        Judgment: Judgment normal.           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  Ultrasound of the right upper quadrant with elastography is scheduled for tomorrow.  After this we will endeavor to start Epclusa and is through 3 months of therapy.  Vaccine counseling recommended hepatitis B vaccination which we gave him dose #1 today.  I have scheduled him to come back in the clinic and see me in roughly a month's time.

## 2023-03-07 ENCOUNTER — Ambulatory Visit
Admission: RE | Admit: 2023-03-07 | Discharge: 2023-03-07 | Disposition: A | Discharge status: Home / Self Care | Payer: Commercial Managed Care - HMO | Source: Ambulatory Visit | Attending: Infectious Disease | Admitting: Infectious Disease

## 2023-03-07 DIAGNOSIS — B182 Chronic viral hepatitis C: Secondary | ICD-10-CM

## 2023-03-11 NOTE — Progress Notes (Signed)
Yeah, since he was F4, wanted to make sure [place] did not show any concerns before we completed the pharmacy side of things. We will work on approval. Thanks!

## 2023-03-12 ENCOUNTER — Telehealth: Payer: Self-pay | Admitting: Pharmacist

## 2023-03-12 NOTE — Telephone Encounter (Signed)
Submitted and faxed appeal to [employer] today after receiving Epclusa denial. Will await response.  Margarite Gouge, PharmD, CPP, BCIDP, AAHIVP Clinical Pharmacist Practitioner Infectious Diseases Clinical Pharmacist [facility]

## 2023-03-18 ENCOUNTER — Other Ambulatory Visit: Payer: Self-pay | Admitting: Nurse Practitioner

## 2023-03-18 DIAGNOSIS — G8929 Other chronic pain: Secondary | ICD-10-CM

## 2023-04-02 ENCOUNTER — Other Ambulatory Visit (HOSPITAL_COMMUNITY): Payer: Self-pay

## 2023-04-04 ENCOUNTER — Ambulatory Visit (INDEPENDENT_AMBULATORY_CARE_PROVIDER_SITE_OTHER): Payer: Commercial Managed Care - HMO | Admitting: Student

## 2023-04-04 VITALS — BP 106/75 | HR 72 | Ht 69.0 in | Wt 184.0 lb

## 2023-04-04 DIAGNOSIS — G250 Essential tremor: Secondary | ICD-10-CM

## 2023-04-04 DIAGNOSIS — F200 Paranoid schizophrenia: Secondary | ICD-10-CM

## 2023-04-04 DIAGNOSIS — F411 Generalized anxiety disorder: Secondary | ICD-10-CM | POA: Diagnosis not present

## 2023-04-04 NOTE — Progress Notes (Signed)
BH MD Outpatient Progress Note  04/05/2023 8:39 AM Curtis Mcdonald  MRN:  [id number]  Assessment:  Curtis Mcdonald presents for follow-up evaluation. Today, 04/05/23, the patient reports that he is functioning well and has no complaints.  He states that he has occasional auditory hallucinations that are unchanged from the past several visits.  No medication changes have been made for some time, in accordance with the patient's wishes.  It is notable that the patient has experienced significant objective improvement in his tremor with the addition of propranolol.  The patient reports some benefit to taking this medication.   Identifying Information: Curtis Mcdonald is a 67 y.o. male with a history of schizophrenia and anxiety who is an established patient with [facility] for treatment of schizophrenia.  Plan:  # Schizophrenia  Anxiety Interventions: -- Continue Latuda 40 mg daily - Aims of 0 on 1/25, 4/18 - EKG October 2023 with QTc of 483 -Lipids unremarkable in February 2023 -- Continue Buspar 10 mg TID -- Continue hydroxyzine at decreased dose of 25 mg daily (decreased on 4/18)   # Sleep Past medication trials: unknown Status of problem: stable Interventions: -- Continue trazodone 300 mg nightly  # Tremor, most likely essential tremor Past medication trials: none Interventions:  -- Continue propranolol 10 mg twice daily for tremor  Patient was given contact information for behavioral health clinic and was instructed to call 911 for emergencies.   Subjective:  Chief Complaint:  Chief Complaint  Patient presents with   Follow-up    Interval History:  Today the patient reports that he has been spending most of his time reading the Bible and watching TV.  He finds both of these activities enjoyable and states that he is doing well.  It is noted from previous encounters that the patient has a strong spiritual community.  He says that he feels depressed  occasionally when watching the news but this is transitory.  He states that he experiences auditory hallucinations approximately every other day.  He reports that he hears a voice telling him "be on the look out".  He is not disturbed by this voice and does not take any action guarding what it says.  He says that he experiences paranoia "sometimes" but that again this is not particularly bothersome.  The paranoia is characterized by worry that people are out to get him.  Throughout the visit he demonstrates a linear and logical thought process.  His affect is constricted.  He declines any change to his medications, saying he is very satisfied with how he is doing with respect to his hallucinations and paranoia.  It is noted that his resting and intention tremor are absent, even on finger-to-nose testing.   Visit Diagnosis:    ICD-10-CM   1. Paranoid schizophrenia (HCC)  F20.0     2. Essential tremor  G25.0        Past Psychiatric History:  Diagnoses: schizophrenia, anxiety, cocaine abuse  Medication trials: unknown Hospitalizations: denies Suicide attempts: denies Hx of abuse: denies Substance use:   -- Denies use of cannabis, cocaine (last used in 2004), or illicit drug use  -- Etoh: denies  -- Tobacco: denies  -- Caffeine: 1 cup once a week  Past Medical History:  Past Medical History:  Diagnosis Date   Anxiety    Chronic hepatitis C without hepatic coma (HCC) 02/04/2023   Paranoid schizophrenia (HCC)    Stroke (HCC) 2017    Past Surgical History:  Procedure Laterality Date   COLONOSCOPY  2013   WISDOM TOOTH EXTRACTION      Family Psychiatric History: denies  Family History:  Family History  Problem Relation Age of Onset   CAD Neg Hx    Diabetes Neg Hx    Colon cancer Neg Hx    Colon polyps Neg Hx    Esophageal cancer Neg Hx    Rectal cancer Neg Hx    Stomach cancer Neg Hx     Social History:  Social History   Socioeconomic History   Marital status: Single     Spouse name: Not on file   Number of children: Not on file   Years of education: Not on file   Highest education level: GED or equivalent  Occupational History   Occupation: Atm assembly    Comment: [employer]  Tobacco Use   Smoking status: Never   Smokeless tobacco: Never  Vaping Use   Vaping Use: Never used  Substance and Sexual Activity   Alcohol use: No    Comment: fomer    Drug use: No   Sexual activity: Not Currently  Other Topics Concern   Not on file  Social History Narrative   Not on file   Social Determinants of Health   Financial Resource Strain: Low Risk  (12/29/2022)   Overall Financial Resource Strain (CARDIA)    Difficulty of Paying Living Expenses: Not hard at all  Food Insecurity: No Food Insecurity (12/29/2022)   Hunger Vital Sign    Worried About Running Out of Food in the Last Year: Never true    Ran Out of Food in the Last Year: Never true  Transportation Needs: Unknown (12/29/2022)   PRAPARE - Administrator, Civil Service (Medical): Not on file    Lack of Transportation (Non-Medical): No  Physical Activity: Unknown (12/29/2022)   Exercise Vital Sign    Days of Exercise per Week: Patient declined    Minutes of Exercise per Session: Not on file  Stress: Stress Concern Present (12/29/2022)   [employer] of Occupational Health - Occupational Stress Questionnaire    Feeling of Stress : To some extent  Social Connections: Moderately Isolated (12/29/2022)   Social Connection and Isolation Panel [NHANES]    Frequency of Communication with Friends and Family: Never    Frequency of Social Gatherings with Friends and Family: Never    Attends Religious Services: More than 4 times per year    Active Member of [employer] or Organizations: Yes    Attends Engineer, structural: More than 4 times per year    Marital Status: Never married    Allergies: No Known Allergies  Current Medications: Current Outpatient Medications  Medication Sig  Dispense Refill   busPIRone (BUSPAR) 10 MG tablet Take 1 tablet (10 mg total) by mouth 3 (three) times daily. 180 tablet 1   Elastic Bandages & Supports (LUMBAR BACK BRACE/SUPPORT PAD) MISC 1 each by Does not apply route daily. PLEASE FAX TO [phone] along with demographics and insurance information 1 each 0   hydrOXYzine (ATARAX) 25 MG tablet Take 1 tablet (25 mg total) by mouth daily. 90 tablet 1   lurasidone (LATUDA) 40 MG TABS tablet Take 1 tablet (40 mg total) by mouth daily with breakfast. 90 tablet 1   meloxicam (MOBIC) 7.5 MG tablet TAKE 1-2 TABLETS BY MOUTH DAILY 60 tablet 0   propranolol (INDERAL) 10 MG tablet Take 1 tablet (10 mg total) by mouth 2 (two) times daily  as needed. 60 tablet 0   trazodone (DESYREL) 300 MG tablet Take 1 tablet (300 mg total) by mouth at bedtime. 30 tablet 2   Vitamin D, Ergocalciferol, (DRISDOL) 1.25 MG (50000 UNIT) CAPS capsule Take 1 capsule (50,000 Units total) by mouth every 7 (seven) days. Please fill as a 90 day supply 12 capsule 0   No current facility-administered medications for this visit.     Objective:  Psychiatric Specialty Exam: Blood pressure 106/75, pulse 72, height 5\' 9"  (1.753 m), weight 184 lb (83.5 kg).Body mass index is 27.17 kg/m.  General Appearance: Casual and Fairly Groomed  Eye Contact:  Good  Speech:  Clear and Coherent and Normal Rate  Volume:  Normal  Mood:   "good"  Affect: Constricted  Thought Content: occasional and fleeting AH and paranoia  Suicidal Thoughts:  No  Homicidal Thoughts:  No  Thought Process:  Goal Directed and Linear  Orientation:  Full (Time, Place, and Person)    Memory:   Grossly intact  Judgment:  Good  Insight:  Good  Concentration:  Concentration: Good  Recall:  NA  Fund of Knowledge: Good  Language: Good  Psychomotor Activity: Normal  Akathisia:  No  AIMS (if indicated): see above  Assets:  Communication Skills Desire for Improvement Housing Leisure Time Physical  Health Transportation  ADL's:  Intact  Cognition: WNL  Sleep:  Good   Physical Exam Constitutional:      Appearance: the patient is not toxic-appearing.  Pulmonary:     Effort: Pulmonary effort is normal.  Neurological:     General: No tremor    Mental Status: the patient is alert and oriented to person, place, and time.   Review of Systems  Respiratory:  Negative for shortness of breath.   Cardiovascular:  Negative for chest pain.  Gastrointestinal:  Negative for abdominal pain, constipation, diarrhea, nausea and vomiting.  Neurological:  Negative for headaches.    Metabolic Disorder Labs: Lab Results  Component Value Date   HGBA1C 5.1 04/12/2016   MPG 100 04/12/2016   No results found for: "PROLACTIN" Lab Results  Component Value Date   CHOL 110 11/20/2022   TRIG 82 11/20/2022   HDL 33 (L) 11/20/2022   CHOLHDL 3.3 11/20/2022   VLDL 14 04/12/2016   LDLCALC 60 11/20/2022   LDLCALC 84 04/12/2016   Lab Results  Component Value Date   TSH 0.330 (L) 04/12/2016    Therapeutic Level Labs: No results found for: "LITHIUM" No results found for: "VALPROATE" No results found for: "CBMZ"  Screenings:  AIMS    Flowsheet Row Clinical Support from 10/06/2020 in [facility]  AIMS Total Score 0      GAD-7    Flowsheet Row Office Visit from 01/02/2023 in [facility] Office Visit from 11/20/2022 in [facility] Video Visit from 05/11/2022 in [facility] Office Visit from 02/01/2022 in [facility] Clinical Support from 08/23/2021 in [facility]  Total GAD-7 Score 6 10 9 6 1       PHQ2-9    Flowsheet Row Office Visit from 02/04/2023 in [facility] Office Visit from 01/02/2023 in [facility] Office Visit from 11/20/2022 in [facility] Video Visit from 05/11/2022 in [facility] Office Visit from 02/01/2022 in [facility]  PHQ-2 Total Score 1 3 0 2 0  PHQ-9 Total Score -- 5 0 3 --      Flowsheet Row ED from 05/13/2022 in [facility] Office Visit from 02/01/2022 in [facility] Clinical Support from 02/20/2021 in [facility]  C-SSRS RISK CATEGORY No Risk No Risk No Risk       Collaboration of Care: none   I provided 30 minutes of face-to-face time during this encounter.  Carlyn Reichert, MD 04/05/2023, 8:39 AM

## 2023-04-05 ENCOUNTER — Other Ambulatory Visit: Payer: Self-pay | Admitting: Pharmacist

## 2023-04-05 ENCOUNTER — Other Ambulatory Visit (HOSPITAL_COMMUNITY): Payer: Self-pay

## 2023-04-05 ENCOUNTER — Encounter (HOSPITAL_COMMUNITY): Payer: Self-pay | Admitting: Student

## 2023-04-05 ENCOUNTER — Telehealth: Payer: Self-pay

## 2023-04-05 ENCOUNTER — Other Ambulatory Visit: Payer: Self-pay

## 2023-04-05 DIAGNOSIS — B182 Chronic viral hepatitis C: Secondary | ICD-10-CM

## 2023-04-05 MED ORDER — LURASIDONE HCL 40 MG PO TABS
40.0000 mg | ORAL_TABLET | Freq: Every day | ORAL | 1 refills | Status: DC
Start: 2023-04-05 — End: 2023-07-04

## 2023-04-05 MED ORDER — BUSPIRONE HCL 10 MG PO TABS
10.0000 mg | ORAL_TABLET | Freq: Three times a day (TID) | ORAL | 1 refills | Status: DC
Start: 2023-04-05 — End: 2023-07-04

## 2023-04-05 MED ORDER — TRAZODONE HCL 300 MG PO TABS
300.0000 mg | ORAL_TABLET | Freq: Every evening | ORAL | 2 refills | Status: DC
Start: 2023-04-05 — End: 2023-07-04

## 2023-04-05 MED ORDER — HYDROXYZINE HCL 25 MG PO TABS
25.0000 mg | ORAL_TABLET | Freq: Every day | ORAL | 1 refills | Status: DC
Start: 2023-04-05 — End: 2023-07-04

## 2023-04-05 MED ORDER — SOFOSBUVIR-VELPATASVIR 400-100 MG PO TABS
1.0000 | ORAL_TABLET | Freq: Every day | ORAL | 2 refills | Status: AC
Start: 2023-04-05 — End: ?
  Filled 2023-04-05 – 2023-04-09 (×6): qty 28, 28d supply, fill #0
  Filled 2023-04-30: qty 28, 28d supply, fill #1
  Filled 2023-05-24: qty 28, 28d supply, fill #2

## 2023-04-05 MED ORDER — PROPRANOLOL HCL 10 MG PO TABS
10.0000 mg | ORAL_TABLET | Freq: Two times a day (BID) | ORAL | 0 refills | Status: DC | PRN
Start: 2023-04-05 — End: 2023-05-08

## 2023-04-05 NOTE — Telephone Encounter (Signed)
[facility] Patient Advocate Encounter  Prior Authorization for Epclusa (generic) has been approved.    PA# [id number] Effective dates: 04/04/23 through 07/05/23  Patients co-pay is $5,944.71.   Prescription will be able to be filled at [facility].   I have a copay card through [employer] for $5.00, but I have to call [employer] @ [phone] to see if we can get additional assistance because the copay is so high.       [facility] Clinic will continue to follow.  [name], CPhT Specialty Pharmacy Patient [facility] for Infectious Disease Phone: [phone] Fax:  [phone]

## 2023-04-08 ENCOUNTER — Other Ambulatory Visit: Payer: Self-pay

## 2023-04-08 ENCOUNTER — Other Ambulatory Visit (HOSPITAL_COMMUNITY): Payer: Self-pay

## 2023-04-09 ENCOUNTER — Telehealth: Payer: Self-pay | Admitting: Pharmacist

## 2023-04-09 ENCOUNTER — Other Ambulatory Visit (HOSPITAL_COMMUNITY): Payer: Self-pay

## 2023-04-09 ENCOUNTER — Other Ambulatory Visit: Payer: Self-pay

## 2023-04-09 ENCOUNTER — Other Ambulatory Visit: Payer: Self-pay | Admitting: Nurse Practitioner

## 2023-04-09 ENCOUNTER — Encounter: Payer: Self-pay | Admitting: Infectious Disease

## 2023-04-09 ENCOUNTER — Ambulatory Visit: Payer: Commercial Managed Care - HMO | Admitting: Infectious Disease

## 2023-04-09 VITALS — BP 124/83 | HR 64 | Temp 97.8°F | Ht 69.0 in | Wt 181.0 lb

## 2023-04-09 DIAGNOSIS — B182 Chronic viral hepatitis C: Secondary | ICD-10-CM

## 2023-04-09 DIAGNOSIS — Z7185 Encounter for immunization safety counseling: Secondary | ICD-10-CM

## 2023-04-09 DIAGNOSIS — K7460 Unspecified cirrhosis of liver: Secondary | ICD-10-CM | POA: Diagnosis not present

## 2023-04-09 DIAGNOSIS — G8929 Other chronic pain: Secondary | ICD-10-CM

## 2023-04-09 DIAGNOSIS — Z23 Encounter for immunization: Secondary | ICD-10-CM | POA: Diagnosis not present

## 2023-04-09 NOTE — Addendum Note (Signed)
Addended by: Philippa Chester on: 04/09/2023 10:15 AM   Modules accepted: Orders

## 2023-04-09 NOTE — Progress Notes (Signed)
Complaint follow-up for hepatitis C Subjective:    Patient ID: Curtis Mcdonald, male    DOB: 1956/01/05, 67 y.o.   MRN: [id number]  HPI  Curtis Mcdonald is a 67 year old Black man with hx of CVA, schizophrenia was referred to [place] for treatment of his chronic hepatitis C without hepatic coma.  He does not recall having had a blood transfusion but also has not had tattoos he is not sexually active for the last 10 years.  He denies intravenous drug use.  He is tested negative for HIV as well as hepatitis B.  He still needs elastography prior to initiation of a regimen he is scheduled tomorrow in the morning.  He does not have antibodies to hepatitis B but does to hepatitis A.  Gave him a first dose of hepatitis B vaccine at last visit.  Elastography showed significant cirrhosis and we endeavored to get him Epclusa.  Unfortunately his public insurance Medicaid, Medicare did not yet cover meds in an affordable way which is ridiculous.       Past Medical History:  Diagnosis Date   Anxiety    Chronic hepatitis C without hepatic coma (HCC) 02/04/2023   Paranoid schizophrenia (HCC)    Stroke (HCC) 2017    Past Surgical History:  Procedure Laterality Date   COLONOSCOPY  2013   WISDOM TOOTH EXTRACTION      Family History  Problem Relation Age of Onset   CAD Neg Hx    Diabetes Neg Hx    Colon cancer Neg Hx    Colon polyps Neg Hx    Esophageal cancer Neg Hx    Rectal cancer Neg Hx    Stomach cancer Neg Hx       Social History   Socioeconomic History   Marital status: Single    Spouse name: Not on file   Number of children: Not on file   Years of education: Not on file   Highest education level: GED or equivalent  Occupational History   Occupation: Atm assembly    Comment: [employer]  Tobacco Use   Smoking status: Never   Smokeless tobacco: Never  Vaping Use   Vaping Use: Never used  Substance and Sexual Activity   Alcohol use: No    Comment: fomer    Drug use: No   Sexual  activity: Not Currently  Other Topics Concern   Not on file  Social History Narrative   Not on file   Social Determinants of Health   Financial Resource Strain: Low Risk  (12/29/2022)   Overall Financial Resource Strain (CARDIA)    Difficulty of Paying Living Expenses: Not hard at all  Food Insecurity: No Food Insecurity (12/29/2022)   Hunger Vital Sign    Worried About Running Out of Food in the Last Year: Never true    Ran Out of Food in the Last Year: Never true  Transportation Needs: Unknown (12/29/2022)   PRAPARE - Administrator, Civil Service (Medical): Not on file    Lack of Transportation (Non-Medical): No  Physical Activity: Unknown (12/29/2022)   Exercise Vital Sign    Days of Exercise per Week: Patient declined    Minutes of Exercise per Session: Not on file  Stress: Stress Concern Present (12/29/2022)   [employer] of Occupational Health - Occupational Stress Questionnaire    Feeling of Stress : To some extent  Social Connections: Moderately Isolated (12/29/2022)   Social Connection and Isolation Panel [NHANES]    Frequency  of Communication with Friends and Family: Never    Frequency of Social Gatherings with Friends and Family: Never    Attends Religious Services: More than 4 times per year    Active Member of Clubs or Organizations: Yes    Attends Engineer, structural: More than 4 times per year    Marital Status: Never married    No Known Allergies   Current Outpatient Medications:    busPIRone (BUSPAR) 10 MG tablet, Take 1 tablet (10 mg total) by mouth 3 (three) times daily., Disp: 180 tablet, Rfl: 1   Elastic Bandages & Supports (LUMBAR BACK BRACE/SUPPORT PAD) MISC, 1 each by Does not apply route daily. PLEASE FAX TO [phone] along with demographics and insurance information, Disp: 1 each, Rfl: 0   hydrOXYzine (ATARAX) 25 MG tablet, Take 1 tablet (25 mg total) by mouth daily., Disp: 90 tablet, Rfl: 1   lurasidone (LATUDA) 40 MG TABS  tablet, Take 1 tablet (40 mg total) by mouth daily with breakfast., Disp: 90 tablet, Rfl: 1   meloxicam (MOBIC) 7.5 MG tablet, TAKE 1-2 TABLETS BY MOUTH DAILY, Disp: 60 tablet, Rfl: 0   propranolol (INDERAL) 10 MG tablet, Take 1 tablet (10 mg total) by mouth 2 (two) times daily as needed., Disp: 60 tablet, Rfl: 0   trazodone (DESYREL) 300 MG tablet, Take 1 tablet (300 mg total) by mouth at bedtime., Disp: 30 tablet, Rfl: 2   Vitamin D, Ergocalciferol, (DRISDOL) 1.25 MG (50000 UNIT) CAPS capsule, Take 1 capsule (50,000 Units total) by mouth every 7 (seven) days. Please fill as a 90 day supply, Disp: 12 capsule, Rfl: 0   Sofosbuvir-Velpatasvir (EPCLUSA) 400-100 MG TABS, Take 1 tablet by mouth daily., Disp: 28 tablet, Rfl: 2   Review of Systems  Constitutional:  Negative for activity change, appetite change, chills, diaphoresis, fatigue, fever and unexpected weight change.  HENT:  Negative for congestion, rhinorrhea, sinus pressure, sneezing, sore throat and trouble swallowing.   Eyes:  Negative for photophobia and visual disturbance.  Respiratory:  Negative for cough, chest tightness, shortness of breath, wheezing and stridor.   Cardiovascular:  Negative for chest pain, palpitations and leg swelling.  Gastrointestinal:  Negative for abdominal distention, abdominal pain, anal bleeding, blood in stool, constipation, diarrhea, nausea and vomiting.  Genitourinary:  Negative for difficulty urinating, dysuria, flank pain and hematuria.  Musculoskeletal:  Negative for arthralgias, back pain, gait problem, joint swelling and myalgias.  Skin:  Negative for color change, pallor, rash and wound.  Neurological:  Negative for dizziness, tremors, weakness and light-headedness.  Hematological:  Negative for adenopathy. Does not bruise/bleed easily.  Psychiatric/Behavioral:  Negative for agitation, behavioral problems, confusion, decreased concentration, dysphoric mood and sleep disturbance.        Objective:    Physical Exam Constitutional:      Appearance: He is well-developed.  HENT:     Head: Normocephalic and atraumatic.  Eyes:     Conjunctiva/sclera: Conjunctivae normal.  Cardiovascular:     Rate and Rhythm: Normal rate and regular rhythm.  Pulmonary:     Effort: Pulmonary effort is normal. No respiratory distress.     Breath sounds: No wheezing.  Abdominal:     General: There is no distension.     Palpations: Abdomen is soft.  Musculoskeletal:        General: No tenderness. Normal range of motion.     Cervical back: Normal range of motion and neck supple.  Skin:    General: Skin is warm and dry.  Coloration: Skin is not pale.     Findings: No erythema or rash.  Neurological:     General: No focal deficit present.     Mental Status: He is alert and oriented to person, place, and time.  Psychiatric:        Mood and Affect: Mood normal.        Behavior: Behavior normal.        Thought Content: Thought content normal.        Judgment: Judgment normal.           Assessment & Plan:   Chronic Hepatitis C without hepatic coma:  His labs including his FibroSure and his elastography  We are continuing to try to obtain Epclusa for 3 months for him  Elastography had shown significant cirrhosis with suggestion of potential portal hypertension:  I am referring him to GI for evaluation.  If our [facility] GI physicians are not comfortable with him we can certainly refer to [facility] hepatology  Will need to screen him for hepatocellular carcinoma periodically.  Vaccine counseling gave him second dose of hep B vaccine

## 2023-04-09 NOTE — Telephone Encounter (Signed)
Patient is approved to receive Epclusa x 12 weeks for chronic Hepatitis C infection. Counseled patient to take Epclusa daily with or without food. Encouraged patient not to miss any doses and explained how their chance of cure could go down with each dose missed. Counseled patient on what to do if dose is missed - if it is closer to the missed dose take immediately; if closer to next dose skip dose and take the next dose at the usual time. Counseled patient on common side effects such as headache, fatigue, and nausea and that these normally decrease with time. I reviewed patient medications and found no drug interactions. Discussed with patient that there are several drug interactions including acid suppressants. Instructed patient to call clinic if he wishes to start a new medication during course of therapy. Also advised patient to call if he experiences any side effects. Patient will follow-up with Cassie in the pharmacy clinic on 8/5.  Margarite Gouge, PharmD, CPP, BCIDP, AAHIVP Clinical Pharmacist Practitioner Infectious Diseases Clinical Pharmacist [facility] for Infectious Disease

## 2023-04-10 ENCOUNTER — Encounter: Payer: Self-pay | Admitting: Nurse Practitioner

## 2023-04-10 ENCOUNTER — Other Ambulatory Visit: Payer: Self-pay

## 2023-04-10 ENCOUNTER — Ambulatory Visit: Payer: Commercial Managed Care - HMO | Attending: Nurse Practitioner | Admitting: Nurse Practitioner

## 2023-04-10 VITALS — BP 139/80 | HR 62 | Ht 69.0 in | Wt 182.8 lb

## 2023-04-10 DIAGNOSIS — G8929 Other chronic pain: Secondary | ICD-10-CM

## 2023-04-10 DIAGNOSIS — R748 Abnormal levels of other serum enzymes: Secondary | ICD-10-CM

## 2023-04-10 DIAGNOSIS — R7989 Other specified abnormal findings of blood chemistry: Secondary | ICD-10-CM | POA: Diagnosis not present

## 2023-04-10 DIAGNOSIS — R7309 Other abnormal glucose: Secondary | ICD-10-CM

## 2023-04-10 DIAGNOSIS — Z23 Encounter for immunization: Secondary | ICD-10-CM | POA: Diagnosis not present

## 2023-04-10 DIAGNOSIS — M545 Low back pain, unspecified: Secondary | ICD-10-CM

## 2023-04-10 MED ORDER — MELOXICAM 7.5 MG PO TABS
7.5000 mg | ORAL_TABLET | Freq: Every day | ORAL | 0 refills | Status: DC
Start: 2023-04-10 — End: 2023-05-10

## 2023-04-10 NOTE — Progress Notes (Signed)
Assessment & Plan:  Curtis Mcdonald was seen today for back pain.  Diagnoses and all orders for this visit:  Chronic bilateral low back pain without sciatica Continue meloxicam for back pain. May alternate with tylenol for pain relief.  -     meloxicam (MOBIC) 7.5 MG tablet; Take 1-2 tablets (7.5-15 mg total) by mouth daily. -     Ambulatory referral to Orthopedic Surgery  Need for shingles vaccine -     Varicella-zoster vaccine IM  Low serum calcium -     CMP14+EGFR  Elevated liver enzymes -     Lipid panel  Elevated glucose -     Hemoglobin A1c    Patient has been counseled on age-appropriate routine health concerns for screening and prevention. These are reviewed and up-to-date. Referrals have been placed accordingly. Immunizations are up-to-date or declined.    Subjective:   Chief Complaint  Patient presents with   Back Pain    Curtis Mcdonald 67 y.o. male presents to office today for follow up to chronic conditions. His psychiatrist has asked that we obtain EKG, A1c and lipids due to Curtis Mcdonald being prescribed Latuda.   He has a past medical history of Anxiety, Paranoid schizophrenia, and Stroke with no residual deficits(2017). Followed by behavioral health   At his last visit  with me 01-02-2023 trazodone was increased to 300 mg due to insomnia.   Chronic back pain Several years (2017) on and off over the past few years. Takes ibuprofen over the counter for back pain. His current back brace that he purchased over the counter does not seem to provide relief of pain. Unrelated to any injury or trauma. Pain described as aching and tightness 6/10. Aggravating factors: Bending over to tie shoes. Denies sciatica symptoms or incontinence of urine or stool.  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 Had to wear back brace at that time as well.  Physical therapy was ordered at his last visit with me 3 months  ago. He was discharged from therapy services in May due to lack of progress. At this time we will refer to ortho. Lumbar xray 11-21-22 Minimal degenerative endplate changes in the lumbar spine.  Review of Systems  Constitutional:  Negative for fever, malaise/fatigue and weight loss.  HENT: Negative.  Negative for nosebleeds.   Eyes: Negative.  Negative for blurred vision, double vision and photophobia.  Respiratory: Negative.  Negative for cough and shortness of breath.   Cardiovascular: Negative.  Negative for chest pain, palpitations and leg swelling.  Gastrointestinal: Negative.  Negative for heartburn, nausea and vomiting.  Musculoskeletal:  Positive for back pain. Negative for myalgias.  Neurological: Negative.  Negative for dizziness, focal weakness, seizures and headaches.  Psychiatric/Behavioral: Negative.  Negative for suicidal ideas.     Past Medical History:  Diagnosis Date   Anxiety    Chronic hepatitis C without hepatic coma (HCC) 02/04/2023   Paranoid schizophrenia (HCC)    Stroke (HCC) 2017    Past Surgical History:  Procedure Laterality Date   COLONOSCOPY  2013   WISDOM TOOTH EXTRACTION      Family History  Problem Relation Age of Onset   CAD Neg Hx    Diabetes Neg Hx    Colon cancer Neg Hx    Colon polyps Neg Hx    Esophageal cancer Neg Hx    Rectal cancer Neg Hx    Stomach  cancer Neg Hx     Social History Reviewed with no changes to be made today.   Outpatient Medications Prior to Visit  Medication Sig Dispense Refill   busPIRone (BUSPAR) 10 MG tablet Take 1 tablet (10 mg total) by mouth 3 (three) times daily. 180 tablet 1   Elastic Bandages & Supports (LUMBAR BACK BRACE/SUPPORT PAD) MISC 1 each by Does not apply route daily. PLEASE FAX TO [phone] along with demographics and insurance information 1 each 0   hydrOXYzine (ATARAX) 25 MG tablet Take 1 tablet (25 mg total) by mouth daily. 90 tablet 1   lurasidone (LATUDA) 40 MG TABS tablet Take 1  tablet (40 mg total) by mouth daily with breakfast. 90 tablet 1   propranolol (INDERAL) 10 MG tablet Take 1 tablet (10 mg total) by mouth 2 (two) times daily as needed. 60 tablet 0   Sofosbuvir-Velpatasvir (EPCLUSA) 400-100 MG TABS Take 1 tablet by mouth daily. 28 tablet 2   trazodone (DESYREL) 300 MG tablet Take 1 tablet (300 mg total) by mouth at bedtime. 30 tablet 2   Vitamin D, Ergocalciferol, (DRISDOL) 1.25 MG (50000 UNIT) CAPS capsule Take 1 capsule (50,000 Units total) by mouth every 7 (seven) days. Please fill as a 90 day supply 12 capsule 0   meloxicam (MOBIC) 7.5 MG tablet TAKE 1-2 TABLETS BY MOUTH DAILY 60 tablet 0   No facility-administered medications prior to visit.    No Known Allergies     Objective:    BP (!) 145/81 (BP Location: Left Arm, Patient Position: Sitting, Cuff Size: Normal)   Pulse 62   Ht 5\' 9"  (1.753 m)   Wt 182 lb 12.8 oz (82.9 kg)   SpO2 98%   BMI 26.99 kg/m  Wt Readings from Last 3 Encounters:  04/10/23 182 lb 12.8 oz (82.9 kg)  04/09/23 181 lb (82.1 kg)  03/06/23 179 lb 6.4 oz (81.4 kg)    Physical Exam Vitals and nursing note reviewed.  Constitutional:      Appearance: He is well-developed.  HENT:     Head: Normocephalic and atraumatic.  Cardiovascular:     Rate and Rhythm: Normal rate and regular rhythm.     Heart sounds: Normal heart sounds. No murmur heard.    No friction rub. No gallop.  Pulmonary:     Effort: Pulmonary effort is normal. No tachypnea or respiratory distress.     Breath sounds: Normal breath sounds. No decreased breath sounds, wheezing, rhonchi or rales.  Chest:     Chest wall: No tenderness.  Abdominal:     General: Bowel sounds are normal.     Palpations: Abdomen is soft.  Musculoskeletal:        General: Normal range of motion.     Cervical back: Normal range of motion.  Skin:    General: Skin is warm and dry.  Neurological:     Mental Status: He is alert and oriented to person, place, and time.      Coordination: Coordination normal.  Psychiatric:        Behavior: Behavior normal. Behavior is cooperative.        Thought Content: Thought content normal.        Judgment: Judgment normal.          Patient has been counseled extensively about nutrition and exercise as well as the importance of adherence with medications and regular follow-up. The patient was given clear instructions to go to ER or return to medical center if symptoms  don't improve, worsen or new problems develop. The patient verbalized understanding.   Follow-up: No follow-ups on file.   Claiborne Rigg, FNP-BC [facility] [place], [place] [phone]   04/10/2023, 8:54 AM

## 2023-04-11 LAB — CMP14+EGFR
ALT: 58 IU/L — ABNORMAL HIGH (ref 0–44)
AST: 68 IU/L — ABNORMAL HIGH (ref 0–40)
Albumin: 3.3 g/dL — ABNORMAL LOW (ref 3.9–4.9)
Alkaline Phosphatase: 168 IU/L — ABNORMAL HIGH (ref 44–121)
BUN/Creatinine Ratio: 10 (ref 10–24)
BUN: 11 mg/dL (ref 8–27)
Bilirubin Total: 1.2 mg/dL (ref 0.0–1.2)
CO2: 22 mmol/L (ref 20–29)
Calcium: 8.6 mg/dL (ref 8.6–10.2)
Chloride: 103 mmol/L (ref 96–106)
Creatinine, Ser: 1.05 mg/dL (ref 0.76–1.27)
Globulin, Total: 3.9 g/dL (ref 1.5–4.5)
Glucose: 86 mg/dL (ref 70–99)
Potassium: 4.6 mmol/L (ref 3.5–5.2)
Sodium: 135 mmol/L (ref 134–144)
Total Protein: 7.2 g/dL (ref 6.0–8.5)
eGFR: 79 mL/min/{1.73_m2} (ref >59)

## 2023-04-11 LAB — HEMOGLOBIN A1C
Est. average glucose Bld gHb Est-mCnc: 103 mg/dL
Hgb A1c MFr Bld: 5.2 % (ref 4.8–5.6)

## 2023-04-11 LAB — LIPID PANEL
Chol/HDL Ratio: 3.1 ratio (ref 0.0–5.0)
Cholesterol, Total: 114 mg/dL (ref 100–199)
HDL: 37 mg/dL — ABNORMAL LOW (ref >39)
LDL Chol Calc (NIH): 62 mg/dL (ref 0–99)
Triglycerides: 70 mg/dL (ref 0–149)
VLDL Cholesterol Cal: 15 mg/dL (ref 5–40)

## 2023-04-18 ENCOUNTER — Ambulatory Visit (INDEPENDENT_AMBULATORY_CARE_PROVIDER_SITE_OTHER): Payer: Commercial Managed Care - HMO | Admitting: Physical Medicine and Rehabilitation

## 2023-04-18 ENCOUNTER — Encounter: Payer: Self-pay | Admitting: Physical Medicine and Rehabilitation

## 2023-04-18 DIAGNOSIS — M47816 Spondylosis without myelopathy or radiculopathy, lumbar region: Secondary | ICD-10-CM | POA: Diagnosis not present

## 2023-04-18 DIAGNOSIS — M7918 Myalgia, other site: Secondary | ICD-10-CM | POA: Diagnosis not present

## 2023-04-18 DIAGNOSIS — M545 Low back pain, unspecified: Secondary | ICD-10-CM | POA: Diagnosis not present

## 2023-04-18 DIAGNOSIS — G8929 Other chronic pain: Secondary | ICD-10-CM

## 2023-04-18 NOTE — Progress Notes (Signed)
Curtis Mcdonald - 67 y.o. male MRN [id number]  Date of birth: 03/15/1956  Office Visit Note: Visit Date: 04/18/2023 PCP: Claiborne Rigg, NP Referred by: Claiborne Rigg, NP  Subjective: Chief Complaint  Patient presents with   Lower Back - Pain   HPI: Curtis Mcdonald is a 67 y.o. male who comes in today per the request of Curtis Denver, NP for evaluation of chronic, worsening and severe right sided lower back pain, intermittent radiation of pain up to right thoracic region. Pain ongoing intermittently for 5 plus years, worsens with movement and activity. He describes pain as shooting sensation, currently denies pain at this time. Some relief of pain with home exercise regimen, lumbar brace, rest and use of medications. Good relief of pain with Meloxicam. History of recent formal physical therapy, no relief of pain with these treatments. Recent lumbar x-rays exhibits facet arthropathy to lower lumbar spine. No prior history of lumbar surgery/injections. Patient denies focal weakness, numbness and tingling. No recent trauma or falls.    Review of Systems  Musculoskeletal:  Positive for back pain and myalgias.  Neurological:  Negative for tingling, sensory change, focal weakness and weakness.  All other systems reviewed and are negative.  Otherwise per HPI.  Assessment & Plan: Visit Diagnoses:    ICD-10-CM   1. Chronic bilateral low back pain without sciatica  M54.50 MR LUMBAR SPINE WO CONTRAST   G89.29     2. Spondylosis without myelopathy or radiculopathy, lumbar region  M47.816 MR LUMBAR SPINE WO CONTRAST    3. Myofascial pain syndrome  M79.18 MR LUMBAR SPINE WO CONTRAST       Plan: Findings:  Chronic, worsening and severe right sided lower back pain, intermittent radiation of pain up to right thoracic region. Patient continues to have severe pain despite good conservative therapies such as formal physical therapy, home exercise regimen, rest and use of medications. Patients clinical  presentation and exam are complex, differentials include facet mediated pain vs myofascial pain syndrome. Tenderness noted to right thoracic paraspinal region upon exam today. Next step is to obtain lumbar MRI imaging, depending on imaging we discussed possible lumbar injections. He can continue current medication regimen with PCP. I encouraged pain to remain active as tolerated. No red flag symptoms noted upon exam today.     Meds & Orders: No orders of the defined types were placed in this encounter.   Orders Placed This Encounter  Procedures   MR LUMBAR SPINE WO CONTRAST    Follow-up: Return for Lumbar MRI review.   Procedures: No procedures performed      Clinical History: No specialty comments available.   He reports that he has never smoked. He has never used smokeless tobacco.  Recent Labs    04/10/23 0915  HGBA1C 5.2    Objective:  VS:  HT:    WT:   BMI:     BP:   HR: bpm  TEMP: ( )  RESP:  Physical Exam Vitals and nursing note reviewed.  HENT:     Head: Normocephalic and atraumatic.     Right Ear: External ear normal.     Left Ear: External ear normal.     Nose: Nose normal.     Mouth/Throat:     Mouth: Mucous membranes are moist.  Eyes:     Pupils: Pupils are equal, round, and reactive to light.  Cardiovascular:     Rate and Rhythm: Normal rate.     Pulses: Normal pulses.  Pulmonary:     Effort: Pulmonary effort is normal.  Abdominal:     General: Abdomen is flat. There is no distension.  Musculoskeletal:        General: Tenderness present.     Cervical back: Normal range of motion.     Comments: Patient rises from seated position to standing without difficulty. Good lumbar range of motion. No pain noted with facet loading. 5/5 strength noted with bilateral hip flexion, knee flexion/extension, ankle dorsiflexion/plantarflexion and EHL. No clonus noted bilaterally. No pain upon palpation of greater trochanters. No pain with internal/external rotation of  bilateral hips. Sensation intact bilaterally. Tenderness noted to right thoracic paraspinal region. Negative slump test bilaterally. Ambulates without aid, gait steady.     Skin:    General: Skin is warm and dry.     Capillary Refill: Capillary refill takes less than 2 seconds.  Neurological:     General: No focal deficit present.     Mental Status: He is alert and oriented to person, place, and time.  Psychiatric:        Mood and Affect: Mood normal.        Behavior: Behavior normal.     Ortho Exam  Imaging: No results found.  Past Medical/Family/Surgical/Social History: Medications & Allergies reviewed per EMR, new medications updated. Patient Active Problem List   Diagnosis Date Noted   Essential tremor 02/21/2023   Chronic hepatitis C without hepatic coma (HCC) 02/04/2023   Schizophrenia, unspecified (HCC) 03/02/2020   Generalized anxiety disorder 02/22/2020   LOC (loss of consciousness) (HCC)    Intracranial hemorrhage (HCC) 04/11/2016   Nasal bone fracture 04/11/2016   Syncope 04/11/2016   Syncope and collapse 04/11/2016   Lumbar back pain 04/07/2012   Past Medical History:  Diagnosis Date   Anxiety    Chronic hepatitis C without hepatic coma (HCC) 02/04/2023   Paranoid schizophrenia (HCC)    Stroke (HCC) 2017   Family History  Problem Relation Age of Onset   CAD Neg Hx    Diabetes Neg Hx    Colon cancer Neg Hx    Colon polyps Neg Hx    Esophageal cancer Neg Hx    Rectal cancer Neg Hx    Stomach cancer Neg Hx    Past Surgical History:  Procedure Laterality Date   COLONOSCOPY  2013   WISDOM TOOTH EXTRACTION     Social History   Occupational History   Occupation: Atm assembly    Comment: [employer]  Tobacco Use   Smoking status: Never   Smokeless tobacco: Never  Vaping Use   Vaping status: Never Used  Substance and Sexual Activity   Alcohol use: No    Comment: fomer    Drug use: No   Sexual activity: Not Currently

## 2023-04-18 NOTE — Progress Notes (Signed)
Functional Pain Scale - descriptive words and definitions  Mild (2)   Noticeable when not distracted/no impact on ADL's/sleep only slightly affected and able to   use both passive and active distraction for comfort. Mild range order  Average Pain 5  Lower back pain on the right with no radiation

## 2023-04-29 ENCOUNTER — Other Ambulatory Visit: Payer: Commercial Managed Care - HMO

## 2023-04-30 ENCOUNTER — Other Ambulatory Visit (HOSPITAL_COMMUNITY): Payer: Self-pay

## 2023-05-01 ENCOUNTER — Other Ambulatory Visit (HOSPITAL_COMMUNITY): Payer: Self-pay

## 2023-05-06 ENCOUNTER — Other Ambulatory Visit: Payer: Self-pay | Admitting: Nurse Practitioner

## 2023-05-06 DIAGNOSIS — G250 Essential tremor: Secondary | ICD-10-CM

## 2023-05-06 NOTE — Telephone Encounter (Signed)
Medication Refill - Medication: propranolol (INDERAL) 10 MG tablet Pt states that he has one tablet left.   Has the patient contacted their pharmacy? No.  Preferred Pharmacy (with phone number or street name): [id number] - [place], [place] - [address] [place] [address]  Phone: [phone] Fax: [phone]  Has the patient been seen for an appointment in the last year OR does the patient have an upcoming appointment? Yes.    Agent: Please be advised that RX refills may take up to 3 business days. We ask that you follow-up with your pharmacy.

## 2023-05-07 ENCOUNTER — Other Ambulatory Visit: Payer: Self-pay

## 2023-05-07 NOTE — Telephone Encounter (Signed)
Requested medications are due for refill today.  yes  Requested medications are on the active medications list.  yes  Last refill. 04/05/2023 #60 0 rf  Future visit scheduled.   yes  Notes to clinic.  Rx signed by Carlyn Reichert.    Requested Prescriptions  Pending Prescriptions Disp Refills   propranolol (INDERAL) 10 MG tablet 60 tablet 0    Sig: Take 1 tablet (10 mg total) by mouth 2 (two) times daily as needed.     Cardiovascular:  Beta Blockers Passed - 05/06/2023  1:49 PM      Passed - Last BP in normal range    BP Readings from Last 1 Encounters:  04/10/23 139/80         Passed - Last Heart Rate in normal range    Pulse Readings from Last 1 Encounters:  04/10/23 62         Passed - Valid encounter within last 6 months    Recent Outpatient Visits           3 weeks ago Chronic bilateral low back pain without sciatica   [facility] [place], Shea Stakes, NP   4 months ago Lumbar back pain   [facility] [place], Shea Stakes, NP   5 months ago Encounter to establish care   [facility] Claiborne Rigg, NP       Future Appointments             In 1 month Claiborne Rigg, NP [facility]

## 2023-05-08 ENCOUNTER — Telehealth (HOSPITAL_COMMUNITY): Payer: Self-pay | Admitting: *Deleted

## 2023-05-08 ENCOUNTER — Other Ambulatory Visit (HOSPITAL_COMMUNITY): Payer: Self-pay | Admitting: Student

## 2023-05-08 DIAGNOSIS — G250 Essential tremor: Secondary | ICD-10-CM

## 2023-05-08 MED ORDER — PROPRANOLOL HCL 10 MG PO TABS
10.0000 mg | ORAL_TABLET | Freq: Two times a day (BID) | ORAL | 0 refills | Status: DC | PRN
Start: 2023-05-08 — End: 2023-06-05

## 2023-05-08 NOTE — Telephone Encounter (Signed)
Patient called asking for refill of Inderal. Next appointment in Sept.

## 2023-05-10 ENCOUNTER — Other Ambulatory Visit: Payer: Self-pay | Admitting: Nurse Practitioner

## 2023-05-10 DIAGNOSIS — G8929 Other chronic pain: Secondary | ICD-10-CM

## 2023-05-13 ENCOUNTER — Ambulatory Visit: Payer: Commercial Managed Care - HMO | Admitting: Pharmacist

## 2023-05-16 ENCOUNTER — Other Ambulatory Visit: Payer: Self-pay

## 2023-05-16 ENCOUNTER — Ambulatory Visit (INDEPENDENT_AMBULATORY_CARE_PROVIDER_SITE_OTHER): Payer: Commercial Managed Care - HMO | Admitting: Pharmacist

## 2023-05-16 DIAGNOSIS — B1920 Unspecified viral hepatitis C without hepatic coma: Secondary | ICD-10-CM

## 2023-05-16 DIAGNOSIS — K7460 Unspecified cirrhosis of liver: Secondary | ICD-10-CM

## 2023-05-16 NOTE — Progress Notes (Signed)
05/16/2023  HPI: Curtis Mcdonald is a 67 y.o. male who presents to the [facility] pharmacy clinic for Hepatitis C follow-up.  Medication: Epclusa x 12 weeks  Start Date: 04/10/23  Hepatitis C Genotype: 1a  Hepatitis C RNA: 358,000 on 02/04/23  Patient Active Problem List   Diagnosis Date Noted   Essential tremor 02/21/2023   Chronic hepatitis C without hepatic coma (HCC) 02/04/2023   Schizophrenia, unspecified (HCC) 03/02/2020   Generalized anxiety disorder 02/22/2020   LOC (loss of consciousness) (HCC)    Intracranial hemorrhage (HCC) 04/11/2016   Nasal bone fracture 04/11/2016   Syncope 04/11/2016   Syncope and collapse 04/11/2016   Lumbar back pain 04/07/2012    Patient's Medications  New Prescriptions   No medications on file  Previous Medications   BUSPIRONE (BUSPAR) 10 MG TABLET    Take 1 tablet (10 mg total) by mouth 3 (three) times daily.   ELASTIC BANDAGES & SUPPORTS (LUMBAR BACK BRACE/SUPPORT PAD) MISC    1 each by Does not apply route daily. PLEASE FAX TO [phone] along with demographics and insurance information   HYDROXYZINE (ATARAX) 25 MG TABLET    Take 1 tablet (25 mg total) by mouth daily.   LURASIDONE (LATUDA) 40 MG TABS TABLET    Take 1 tablet (40 mg total) by mouth daily with breakfast.   MELOXICAM (MOBIC) 7.5 MG TABLET    TAKE 1-2 TABLETS BY MOUTH DAILY   PROPRANOLOL (INDERAL) 10 MG TABLET    Take 1 tablet (10 mg total) by mouth 2 (two) times daily as needed.   SOFOSBUVIR-VELPATASVIR (EPCLUSA) 400-100 MG TABS    Take 1 tablet by mouth daily.   TRAZODONE (DESYREL) 300 MG TABLET    Take 1 tablet (300 mg total) by mouth at bedtime.   VITAMIN D, ERGOCALCIFEROL, (DRISDOL) 1.25 MG (50000 UNIT) CAPS CAPSULE    Take 1 capsule (50,000 Units total) by mouth every 7 (seven) days. Please fill as a 90 day supply  Modified Medications   No medications on file  Discontinued Medications   No medications on file    Labs: Hepatitis C Lab Results  Component Value Date    HCVGENOTYPE 1a 02/04/2023   HEPCAB REACTIVE (A) 02/04/2023   HCVRNAPCRQN 358,000 (H) 02/04/2023   FIBROSTAGE Comment 02/04/2023   Hepatitis B Lab Results  Component Value Date   HEPBSAG Negative 04/13/2016   Hepatitis A Lab Results  Component Value Date   HAV REACTIVE (A) 02/04/2023   HIV Lab Results  Component Value Date   HIV NON-REACTIVE 02/04/2023   HIV Non Reactive 04/13/2016   Lab Results  Component Value Date   CREATININE 1.05 04/10/2023   CREATININE 0.85 01/02/2023   CREATININE 0.97 11/20/2022   CREATININE 1.11 05/13/2022   CREATININE 0.80 04/12/2016   Lab Results  Component Value Date   AST 68 (H) 04/10/2023   AST 69 (H) 01/02/2023   AST 54 (H) 11/20/2022   ALT 58 (H) 04/10/2023   ALT 57 (H) 01/02/2023   ALT 50 (H) 11/20/2022    Assessment: Curtis Mcdonald comes in today for his 4 week follow up after starting Epclusa for his Hepatitis C infection. He is doing well on the medication and has already started his 2nd month. He is not having any side effects and states that he takes it every morning and has not missed any doses. Congratulated him on this. Reminded him that he will need 3 total months of treatment (1 more refill). He has no questions  today. Will check labs and have him see Dr. Daiva Eves when he completes treatment.   He has completed his Hepatitis B vaccine series so will check for immunity today.  Plan: - HCV RNA + CMP + Hepatitis B surface antibody today - Continue Epclusa for 12 weeks total - F/u with Dr. Daiva Eves on 08/01/23  Curtis Mcdonald, PharmD, BCIDP, AAHIVP, CPP Clinical Pharmacist Practitioner Infectious Diseases Clinical Pharmacist [facility] 05/16/2023, 8:49 AM

## 2023-05-23 ENCOUNTER — Other Ambulatory Visit (HOSPITAL_COMMUNITY): Payer: Self-pay

## 2023-05-24 ENCOUNTER — Other Ambulatory Visit (HOSPITAL_COMMUNITY): Payer: Self-pay

## 2023-06-03 ENCOUNTER — Telehealth (HOSPITAL_COMMUNITY): Payer: Self-pay

## 2023-06-05 ENCOUNTER — Other Ambulatory Visit (HOSPITAL_COMMUNITY): Payer: Self-pay | Admitting: Student

## 2023-06-05 DIAGNOSIS — G250 Essential tremor: Secondary | ICD-10-CM

## 2023-06-05 MED ORDER — PROPRANOLOL HCL 10 MG PO TABS
10.0000 mg | ORAL_TABLET | Freq: Two times a day (BID) | ORAL | 2 refills | Status: DC | PRN
Start: 2023-06-05 — End: 2023-07-04

## 2023-06-07 ENCOUNTER — Other Ambulatory Visit: Payer: Self-pay | Admitting: Nurse Practitioner

## 2023-06-07 DIAGNOSIS — G8929 Other chronic pain: Secondary | ICD-10-CM

## 2023-06-07 MED ORDER — MELOXICAM 7.5 MG PO TABS
7.5000 mg | ORAL_TABLET | Freq: Every day | ORAL | 0 refills | Status: DC
Start: 2023-06-07 — End: 2023-07-15

## 2023-06-14 ENCOUNTER — Encounter: Payer: Self-pay | Admitting: Nurse Practitioner

## 2023-06-14 ENCOUNTER — Ambulatory Visit: Payer: Commercial Managed Care - HMO | Attending: Nurse Practitioner | Admitting: Nurse Practitioner

## 2023-06-14 VITALS — BP 118/75 | HR 63 | Ht 69.0 in | Wt 181.2 lb

## 2023-06-14 DIAGNOSIS — Z0000 Encounter for general adult medical examination without abnormal findings: Secondary | ICD-10-CM | POA: Diagnosis not present

## 2023-06-14 DIAGNOSIS — H538 Other visual disturbances: Secondary | ICD-10-CM

## 2023-06-14 DIAGNOSIS — Z23 Encounter for immunization: Secondary | ICD-10-CM

## 2023-06-14 NOTE — Progress Notes (Signed)
Assessment & Plan:  Curtis Mcdonald was seen today for annual exam.  Diagnoses and all orders for this visit:  Encounter for annual physical exam -     CMP14+EGFR  Blurred vision, bilateral -     Ambulatory referral to Ophthalmology    Patient has been counseled on age-appropriate routine health concerns for screening and prevention. These are reviewed and up-to-date. Referrals have been placed accordingly. Immunizations are up-to-date or declined.    Subjective:   Chief Complaint  Patient presents with   Annual Exam   HPI Curtis Mcdonald 67 y.o. male presents to office today for physical exam  He has a past medical history of Anxiety, Paranoid schizophrenia, and Stroke with no residual deficits(2017). Followed by behavioral health     Review of Systems  Constitutional:  Negative for fever, malaise/fatigue and weight loss.  HENT: Negative.  Negative for nosebleeds.   Eyes:  Positive for blurred vision. Negative for double vision, photophobia, pain, discharge and redness.  Respiratory: Negative.  Negative for cough and shortness of breath.   Cardiovascular: Negative.  Negative for chest pain, palpitations and leg swelling.  Gastrointestinal: Negative.  Negative for heartburn, nausea and vomiting.  Genitourinary: Negative.   Musculoskeletal:  Positive for back pain. Negative for myalgias.  Skin: Negative.   Neurological: Negative.  Negative for dizziness, focal weakness, seizures and headaches.  Endo/Heme/Allergies: Negative.   Psychiatric/Behavioral: Negative.  Negative for suicidal ideas.     Past Medical History:  Diagnosis Date   Anxiety    Chronic hepatitis C without hepatic coma (HCC) 02/04/2023   Paranoid schizophrenia (HCC)    Stroke (HCC) 2017    Past Surgical History:  Procedure Laterality Date   COLONOSCOPY  2013   WISDOM TOOTH EXTRACTION      Family History  Problem Relation Age of Onset   CAD Neg Hx    Diabetes Neg Hx    Colon cancer Neg Hx    Colon  polyps Neg Hx    Esophageal cancer Neg Hx    Rectal cancer Neg Hx    Stomach cancer Neg Hx     Social History Reviewed with no changes to be made today.   Outpatient Medications Prior to Visit  Medication Sig Dispense Refill   busPIRone (BUSPAR) 10 MG tablet Take 1 tablet (10 mg total) by mouth 3 (three) times daily. 180 tablet 1   Elastic Bandages & Supports (LUMBAR BACK BRACE/SUPPORT PAD) MISC 1 each by Does not apply route daily. PLEASE FAX TO [phone] along with demographics and insurance information 1 each 0   hydrOXYzine (ATARAX) 25 MG tablet Take 1 tablet (25 mg total) by mouth daily. 90 tablet 1   lurasidone (LATUDA) 40 MG TABS tablet Take 1 tablet (40 mg total) by mouth daily with breakfast. 90 tablet 1   meloxicam (MOBIC) 7.5 MG tablet Take 1-2 tablets (7.5-15 mg total) by mouth daily. 60 tablet 0   propranolol (INDERAL) 10 MG tablet Take 1 tablet (10 mg total) by mouth 2 (two) times daily as needed. 60 tablet 2   Sofosbuvir-Velpatasvir (EPCLUSA) 400-100 MG TABS Take 1 tablet by mouth daily. 28 tablet 2   trazodone (DESYREL) 300 MG tablet Take 1 tablet (300 mg total) by mouth at bedtime. 30 tablet 2   Vitamin D, Ergocalciferol, (DRISDOL) 1.25 MG (50000 UNIT) CAPS capsule Take 1 capsule (50,000 Units total) by mouth every 7 (seven) days. Please fill as a 90 day supply 12 capsule 0   No facility-administered medications  prior to visit.    No Known Allergies     Objective:    BP 118/75 (BP Location: Left Arm, Patient Position: Sitting, Cuff Size: Normal)   Pulse 63   Ht 5\' 9"  (1.753 m)   Wt 181 lb 3.2 oz (82.2 kg)   SpO2 95%   BMI 26.76 kg/m  Wt Readings from Last 3 Encounters:  06/14/23 181 lb 3.2 oz (82.2 kg)  04/10/23 182 lb 12.8 oz (82.9 kg)  04/09/23 181 lb (82.1 kg)    Physical Exam Constitutional:      Appearance: He is well-developed.  HENT:     Head: Normocephalic and atraumatic.     Right Ear: Hearing, tympanic membrane, ear canal and external ear  normal.     Left Ear: Hearing, tympanic membrane, ear canal and external ear normal.     Nose: Nose normal. No mucosal edema or rhinorrhea.     Right Turbinates: Not enlarged.     Left Turbinates: Not enlarged.     Mouth/Throat:     Lips: Pink.     Mouth: Mucous membranes are moist.     Dentition: Dental caries present. No gingival swelling, dental abscesses or gum lesions.     Pharynx: Uvula midline.     Tonsils: No tonsillar exudate. 1+ on the right. 1+ on the left.  Eyes:     General: Lids are normal. No scleral icterus.    Extraocular Movements: Extraocular movements intact.     Conjunctiva/sclera: Conjunctivae normal.     Pupils: Pupils are equal, round, and reactive to light.  Neck:     Thyroid: No thyromegaly.     Trachea: No tracheal deviation.  Cardiovascular:     Rate and Rhythm: Normal rate and regular rhythm.     Heart sounds: Normal heart sounds. No murmur heard.    No friction rub. No gallop.  Pulmonary:     Effort: Pulmonary effort is normal. No respiratory distress.     Breath sounds: Normal breath sounds. No wheezing or rales.  Chest:     Chest wall: No mass or tenderness.  Breasts:    Right: No inverted nipple, mass, nipple discharge, skin change or tenderness.     Left: No inverted nipple, mass, nipple discharge, skin change or tenderness.  Abdominal:     General: Bowel sounds are normal. There is no distension.     Palpations: Abdomen is soft. There is no mass.     Tenderness: There is no abdominal tenderness. There is no guarding or rebound.  Musculoskeletal:        General: No tenderness or deformity. Normal range of motion.     Cervical back: Normal range of motion and neck supple.     Comments: Back brace present  Lymphadenopathy:     Cervical: No cervical adenopathy.  Skin:    General: Skin is warm and dry.     Capillary Refill: Capillary refill takes less than 2 seconds.     Findings: No erythema.  Neurological:     Mental Status: He is alert  and oriented to person, place, and time.     Cranial Nerves: No cranial nerve deficit.     Sensory: Sensation is intact.     Motor: No abnormal muscle tone.     Coordination: Coordination is intact. Coordination normal.     Gait: Gait is intact.     Deep Tendon Reflexes: Reflexes normal.     Reflex Scores:      Patellar  reflexes are 1+ on the right side and 1+ on the left side. Psychiatric:        Attention and Perception: Attention normal.        Mood and Affect: Mood normal.        Speech: Speech normal.        Behavior: Behavior normal.        Thought Content: Thought content normal.        Judgment: Judgment normal.          Patient has been counseled extensively about nutrition and exercise as well as the importance of adherence with medications and regular follow-up. The patient was given clear instructions to go to ER or return to medical center if symptoms don't improve, worsen or new problems develop. The patient verbalized understanding.   Follow-up: Return in about 6 months (around 12/12/2023).   Claiborne Rigg, FNP-BC [facility] [place], [place] [phone]   06/14/2023, 10:28 AM

## 2023-06-17 ENCOUNTER — Ambulatory Visit: Payer: Commercial Managed Care - HMO | Admitting: Physician Assistant

## 2023-06-18 ENCOUNTER — Other Ambulatory Visit (HOSPITAL_COMMUNITY): Payer: Self-pay

## 2023-06-19 ENCOUNTER — Other Ambulatory Visit: Payer: Self-pay | Admitting: Nurse Practitioner

## 2023-06-19 DIAGNOSIS — E559 Vitamin D deficiency, unspecified: Secondary | ICD-10-CM

## 2023-06-26 ENCOUNTER — Other Ambulatory Visit (HOSPITAL_COMMUNITY): Payer: Self-pay

## 2023-07-03 NOTE — Progress Notes (Signed)
BH MD Outpatient Progress Note  07/05/2023 1:49 PM Curtis Mcdonald  MRN:  [id number]  Assessment:  Curtis Mcdonald presents for follow-up evaluation.  For background, this patient has been stable on his present dose of Latuda for approximately a year.  He experiences minor hallucinations and paranoia, which are not distressing.  He has opted to keep the dose of his medication the same throughout this period of time.  Unfortunately, today it appears the patient has developed tardive dyskinesia.  On AIMS testing, he scored a 3 for tongue movements.  Previous examination was negative for signs and symptoms of tardive dyskinesia.  We discussed the different treatment options with the patient, including considering reduction in dose of the Latuda.  Through shared decision making, we decided to keep the dose of Latuda the same and start Ingrezza.  The patient was agreeable to this plan.  We will see him again in 6 weeks to assess the efficacy of this intervention.   Identifying Information: Curtis Mcdonald is a 67 y.o. male with a history of schizophrenia and anxiety who is an established patient with [facility] for treatment of schizophrenia.  Plan:  # Schizophrenia  Anxiety Interventions: - Start Ingrezza 40 mg daily - Patient counseled on side effect of potential depression -- Continue Latuda 40 mg daily - Aims of 0 on 1/25, 4/18 ; AIMS of 3 on 9/26 for tongue movements - EKG July 2024 with NSR QTc of 474 (previous Qtc of 480) -Lipids unremarkable in July 2024 - A1c 5.2 in July 2024 -- Continue Buspar 10 mg TID -- Continue hydroxyzine at decreased dose of 25 mg daily (decreased on 4/18)   # Sleep Past medication trials: unknown Status of problem: stable Interventions: -- Continue trazodone 300 mg nightly  # Tremor, most likely essential tremor Past medication trials: none Interventions:  -- Continue propranolol 10 mg twice daily for tremor, can consider an increase  since the patient reports this is no longer fully effective  Patient was given contact information for behavioral health clinic and was instructed to call 911 for emergencies.   Subjective:  Chief Complaint:  Chief Complaint  Patient presents with   Follow-up    Interval History:  Today the patient reports spending much of his time watching [facility].  He states that he is reading the book of Ecclesiastes.  He reports obtaining approximately 6 to 8 hours of sleep per night and eating 3 good sized meals per day.  He reports continued hallucinations that say "be on the look out".  He says that he does not find this bothersome but thinks it is helpful.  He reports experiencing paranoia and mistrust of people he meets.  He says "I do not know what they are up to".  He denies ever experiencing HI.  His medication regimen is reviewed and he reports full adherence.  He reports experiencing a tremor each time he drinks and that this is bothersome.  We discussed the patient's abnormal tongue movements and the patient said he was unaware of them.  He says they are not bothersome.    Visit Diagnosis:    ICD-10-CM   1. Tardive dyskinesia  G24.01 valbenazine (INGREZZA) 40 MG capsule    2. Paranoid schizophrenia (HCC)  F20.0 trazodone (DESYREL) 300 MG tablet    lurasidone (LATUDA) 40 MG TABS tablet    3. Essential tremor  G25.0 propranolol (INDERAL) 10 MG tablet    4. Generalized anxiety disorder  F41.1 busPIRone (BUSPAR) 10 MG tablet    hydrOXYzine (ATARAX) 25 MG tablet        Past Psychiatric History:  Diagnoses: schizophrenia, anxiety, cocaine abuse  Medication trials: unknown Hospitalizations: denies Suicide attempts: denies Hx of abuse: denies Substance use:   -- Denies use of cannabis, cocaine (last used in 2004), or illicit drug use  -- Etoh: denies  -- Tobacco: denies  -- Caffeine: 1 cup once a week  Past Medical History:  Past Medical History:  Diagnosis Date    Anxiety    Chronic hepatitis C without hepatic coma (HCC) 02/04/2023   Paranoid schizophrenia (HCC)    Stroke (HCC) 2017    Past Surgical History:  Procedure Laterality Date   COLONOSCOPY  2013   WISDOM TOOTH EXTRACTION      Family Psychiatric History: denies  Family History:  Family History  Problem Relation Age of Onset   CAD Neg Hx    Diabetes Neg Hx    Colon cancer Neg Hx    Colon polyps Neg Hx    Esophageal cancer Neg Hx    Rectal cancer Neg Hx    Stomach cancer Neg Hx     Social History:  Social History   Socioeconomic History   Marital status: Single    Spouse name: Not on file   Number of children: Not on file   Years of education: Not on file   Highest education level: GED or equivalent  Occupational History   Occupation: Atm assembly    Comment: [employer]  Tobacco Use   Smoking status: Never   Smokeless tobacco: Never  Vaping Use   Vaping status: Never Used  Substance and Sexual Activity   Alcohol use: No    Comment: fomer    Drug use: No   Sexual activity: Not Currently    Birth control/protection: Abstinence  Other Topics Concern   Not on file  Social History Narrative   Not on file   Social Determinants of Health   Financial Resource Strain: Low Risk  (12/29/2022)   Overall Financial Resource Strain (CARDIA)    Difficulty of Paying Living Expenses: Not hard at all  Food Insecurity: No Food Insecurity (12/29/2022)   Hunger Vital Sign    Worried About Running Out of Food in the Last Year: Never true    Ran Out of Food in the Last Year: Never true  Transportation Needs: Unknown (12/29/2022)   PRAPARE - Administrator, Civil Service (Medical): Not on file    Lack of Transportation (Non-Medical): No  Physical Activity: Unknown (12/29/2022)   Exercise Vital Sign    Days of Exercise per Week: Patient declined    Minutes of Exercise per Session: Not on file  Stress: Stress Concern Present (12/29/2022)   [employer] of Occupational  Health - Occupational Stress Questionnaire    Feeling of Stress : To some extent  Social Connections: Moderately Isolated (12/29/2022)   Social Connection and Isolation Panel [NHANES]    Frequency of Communication with Friends and Family: Never    Frequency of Social Gatherings with Friends and Family: Never    Attends Religious Services: More than 4 times per year    Active Member of [employer] or Organizations: Yes    Attends Engineer, structural: More than 4 times per year    Marital Status: Never married    Allergies: No Known Allergies  Current Medications: Current Outpatient Medications  Medication Sig Dispense Refill   valbenazine (INGREZZA)  40 MG capsule Take 1 capsule (40 mg total) by mouth daily. 30 capsule 2   busPIRone (BUSPAR) 10 MG tablet Take 1 tablet (10 mg total) by mouth 3 (three) times daily. 90 tablet 2   Elastic Bandages & Supports (LUMBAR BACK BRACE/SUPPORT PAD) MISC 1 each by Does not apply route daily. PLEASE FAX TO [phone] along with demographics and insurance information 1 each 0   hydrOXYzine (ATARAX) 25 MG tablet Take 1 tablet (25 mg total) by mouth daily. 30 tablet 2   lurasidone (LATUDA) 40 MG TABS tablet Take 1 tablet (40 mg total) by mouth daily with breakfast. 30 tablet 2   meloxicam (MOBIC) 7.5 MG tablet Take 1-2 tablets (7.5-15 mg total) by mouth daily. 60 tablet 0   propranolol (INDERAL) 10 MG tablet Take 1 tablet (10 mg total) by mouth 2 (two) times daily as needed. 60 tablet 2   Sofosbuvir-Velpatasvir (EPCLUSA) 400-100 MG TABS Take 1 tablet by mouth daily. 28 tablet 2   trazodone (DESYREL) 300 MG tablet Take 1 tablet (300 mg total) by mouth at bedtime. 30 tablet 2   Vitamin D, Ergocalciferol, (DRISDOL) 1.25 MG (50000 UNIT) CAPS capsule TAKE 1 CAPSULE BY MOUTH ONCE EVERY 7 DAYS 4 capsule 0   No current facility-administered medications for this visit.     Objective:  Psychiatric Specialty Exam: There were no vitals taken for this  visit.There is no height or weight on file to calculate BMI.  General Appearance: Casual and Fairly Groomed  Eye Contact:  Good  Speech:  Clear and Coherent and Normal Rate  Volume:  Normal  Mood:   "good"  Affect: Constricted  Thought Content: occasional and fleeting AH and paranoia  Suicidal Thoughts:  No  Homicidal Thoughts:  No  Thought Process:  Goal Directed and Linear  Orientation:  Full (Time, Place, and Person)    Memory:   Grossly intact  Judgment:  Good  Insight:  Good  Concentration:  Concentration: Good  Recall:  NA  Fund of Knowledge: Good  Language: Good  Psychomotor Activity: Normal  Akathisia:  No  AIMS (if indicated): see above  Assets:  Communication Skills Desire for Improvement Housing Leisure Time Physical Health Transportation  ADL's:  Intact  Cognition: WNL  Sleep:  Good   Physical Exam Constitutional:      Appearance: the patient is not toxic-appearing.  Pulmonary:     Effort: Pulmonary effort is normal.  Neurological:     General: No tremor    Mental Status: the patient is alert and oriented to person, place, and time.   Review of Systems  Respiratory:  Negative for shortness of breath.   Cardiovascular:  Negative for chest pain.  Gastrointestinal:  Negative for abdominal pain, constipation, diarrhea, nausea and vomiting.  Neurological:  Negative for headaches.    Metabolic Disorder Labs: Lab Results  Component Value Date   HGBA1C 5.2 04/10/2023   MPG 100 04/12/2016   No results found for: "PROLACTIN" Lab Results  Component Value Date   CHOL 114 04/10/2023   TRIG 70 04/10/2023   HDL 37 (L) 04/10/2023   CHOLHDL 3.1 04/10/2023   VLDL 14 04/12/2016   LDLCALC 62 04/10/2023   LDLCALC 60 11/20/2022   Lab Results  Component Value Date   TSH 0.330 (L) 04/12/2016    Therapeutic Level Labs: No results found for: "LITHIUM" No results found for: "VALPROATE" No results found for: "CBMZ"  Screenings:  AIMS    Flowsheet Row  Clinical Support from  10/06/2020 in [facility]  AIMS Total Score 0      GAD-7    Flowsheet Row Office Visit from 06/14/2023 in [facility] Office Visit from 04/10/2023 in [facility] Office Visit from 01/02/2023 in [facility] Office Visit from 11/20/2022 in [facility] Video Visit from 05/11/2022 in [facility]  Total GAD-7 Score 6 7 6 10 9       PHQ2-9    Flowsheet Row Office Visit from 06/14/2023 in [facility] Office Visit from 04/10/2023 in [facility] Office Visit from 04/09/2023 in [facility] Office Visit from 02/04/2023 in [facility] Office Visit from 01/02/2023 in [facility]  PHQ-2 Total Score 1 5 0 1 3  PHQ-9 Total Score 5 6 -- -- 5      Flowsheet Row ED from 05/13/2022 in [facility] at [facility] Office Visit from 02/01/2022 in [facility] Clinical Support from 02/20/2021 in [facility]  C-SSRS RISK CATEGORY No Risk No Risk No Risk       Collaboration of Care: none   I provided 30 minutes of face-to-face time during this encounter.  Carlyn Reichert, MD 07/05/2023, 1:49 PM

## 2023-07-04 ENCOUNTER — Ambulatory Visit (INDEPENDENT_AMBULATORY_CARE_PROVIDER_SITE_OTHER): Payer: Commercial Managed Care - HMO | Admitting: Student

## 2023-07-04 DIAGNOSIS — F200 Paranoid schizophrenia: Secondary | ICD-10-CM

## 2023-07-04 DIAGNOSIS — G2401 Drug induced subacute dyskinesia: Secondary | ICD-10-CM

## 2023-07-04 DIAGNOSIS — F411 Generalized anxiety disorder: Secondary | ICD-10-CM

## 2023-07-04 DIAGNOSIS — G250 Essential tremor: Secondary | ICD-10-CM

## 2023-07-04 MED ORDER — PROPRANOLOL HCL 10 MG PO TABS: 10.0000 mg | ORAL_TABLET | Freq: Two times a day (BID) | ORAL | 2 refills | Status: DC | PRN

## 2023-07-04 MED ORDER — HYDROXYZINE HCL 25 MG PO TABS: 25.0000 mg | ORAL_TABLET | Freq: Every day | ORAL | 2 refills | Status: DC

## 2023-07-04 MED ORDER — LURASIDONE HCL 40 MG PO TABS: 40.0000 mg | ORAL_TABLET | Freq: Every day | ORAL | 2 refills | Status: DC

## 2023-07-04 MED ORDER — VALBENAZINE TOSYLATE 40 MG PO CAPS
40.0000 mg | ORAL_CAPSULE | Freq: Every day | ORAL | 2 refills | Status: DC
Start: 2023-07-04 — End: 2023-08-15

## 2023-07-04 MED ORDER — TRAZODONE HCL 300 MG PO TABS: 300.0000 mg | ORAL_TABLET | Freq: Every evening | ORAL | 2 refills | Status: DC

## 2023-07-04 MED ORDER — BUSPIRONE HCL 10 MG PO TABS: 10.0000 mg | ORAL_TABLET | Freq: Three times a day (TID) | ORAL | 2 refills | Status: DC

## 2023-07-05 DIAGNOSIS — G2401 Drug induced subacute dyskinesia: Secondary | ICD-10-CM | POA: Insufficient documentation

## 2023-07-15 ENCOUNTER — Other Ambulatory Visit: Payer: Self-pay | Admitting: Family Medicine

## 2023-07-15 DIAGNOSIS — G8929 Other chronic pain: Secondary | ICD-10-CM

## 2023-08-01 ENCOUNTER — Other Ambulatory Visit: Payer: Self-pay

## 2023-08-01 ENCOUNTER — Ambulatory Visit (INDEPENDENT_AMBULATORY_CARE_PROVIDER_SITE_OTHER): Payer: Managed Care, Other (non HMO) | Admitting: Infectious Disease

## 2023-08-01 ENCOUNTER — Encounter: Payer: Self-pay | Admitting: Infectious Disease

## 2023-08-01 VITALS — BP 114/75 | HR 63 | Temp 98.1°F | Ht 69.0 in | Wt 180.0 lb

## 2023-08-01 DIAGNOSIS — B182 Chronic viral hepatitis C: Secondary | ICD-10-CM

## 2023-08-01 DIAGNOSIS — Z7185 Encounter for immunization safety counseling: Secondary | ICD-10-CM | POA: Insufficient documentation

## 2023-08-01 DIAGNOSIS — K7469 Other cirrhosis of liver: Secondary | ICD-10-CM

## 2023-08-01 HISTORY — DX: Encounter for immunization safety counseling: Z71.85

## 2023-08-01 NOTE — Progress Notes (Signed)
Chief complaint: Follow-up for chronic hepatitis C without hepatic coma Subjective:    Patient ID: Curtis Mcdonald, male    DOB: September 16, 1956, 67 y.o.   MRN: [id number]  HPI  Discussed the use of AI scribe software for clinical note transcription with the patient, who gave verbal consent to proceed.  History of Present Illness   The patient, with a history of Hepatitis C, completed a three-month course of Epclusa. The last dose was taken in September. They report no new symptoms or side effects since completion of the treatment.  The patient's liver function tests indicate significant liver damage, necessitating the three-month course of Epclusa. They have not yet seen a hepatologist for further management of their liver disease.  The patient received a flu vaccine in September. They have not yet received a COVID-19 vaccine or an RSV vaccine. They are 67 years old.           Past Medical History:  Diagnosis Date   Anxiety    Chronic hepatitis C without hepatic coma (HCC) 02/04/2023   Paranoid schizophrenia (HCC)    Stroke (HCC) 2017    Past Surgical History:  Procedure Laterality Date   COLONOSCOPY  2013   WISDOM TOOTH EXTRACTION      Family History  Problem Relation Age of Onset   CAD Neg Hx    Diabetes Neg Hx    Colon cancer Neg Hx    Colon polyps Neg Hx    Esophageal cancer Neg Hx    Rectal cancer Neg Hx    Stomach cancer Neg Hx       Social History   Socioeconomic History   Marital status: Single    Spouse name: Not on file   Number of children: Not on file   Years of education: Not on file   Highest education level: GED or equivalent  Occupational History   Occupation: Atm assembly    Comment: [employer]  Tobacco Use   Smoking status: Never   Smokeless tobacco: Never  Vaping Use   Vaping status: Never Used  Substance and Sexual Activity   Alcohol use: No    Comment: fomer    Drug use: No   Sexual activity: Not Currently    Birth control/protection:  Abstinence  Other Topics Concern   Not on file  Social History Narrative   Not on file   Social Determinants of Health   Financial Resource Strain: Low Risk  (12/29/2022)   Overall Financial Resource Strain (CARDIA)    Difficulty of Paying Living Expenses: Not hard at all  Food Insecurity: No Food Insecurity (12/29/2022)   Hunger Vital Sign    Worried About Running Out of Food in the Last Year: Never true    Ran Out of Food in the Last Year: Never true  Transportation Needs: Unknown (12/29/2022)   PRAPARE - Administrator, Civil Service (Medical): Not on file    Lack of Transportation (Non-Medical): No  Physical Activity: Unknown (12/29/2022)   Exercise Vital Sign    Days of Exercise per Week: Patient declined    Minutes of Exercise per Session: Not on file  Stress: Stress Concern Present (12/29/2022)   [employer] of Occupational Health - Occupational Stress Questionnaire    Feeling of Stress : To some extent  Social Connections: Moderately Isolated (12/29/2022)   Social Connection and Isolation Panel [NHANES]    Frequency of Communication with Friends and Family: Never    Frequency of Social Gatherings with  Friends and Family: Never    Attends Religious Services: More than 4 times per year    Active Member of Clubs or Organizations: Yes    Attends Engineer, structural: More than 4 times per year    Marital Status: Never married    No Known Allergies   Current Outpatient Medications:    busPIRone (BUSPAR) 10 MG tablet, Take 1 tablet (10 mg total) by mouth 3 (three) times daily., Disp: 90 tablet, Rfl: 2   Elastic Bandages & Supports (LUMBAR BACK BRACE/SUPPORT PAD) MISC, 1 each by Does not apply route daily. PLEASE FAX TO [phone] along with demographics and insurance information, Disp: 1 each, Rfl: 0   hydrOXYzine (ATARAX) 25 MG tablet, Take 1 tablet (25 mg total) by mouth daily., Disp: 30 tablet, Rfl: 2   lurasidone (LATUDA) 40 MG TABS tablet, Take  1 tablet (40 mg total) by mouth daily with breakfast., Disp: 30 tablet, Rfl: 2   meloxicam (MOBIC) 7.5 MG tablet, TAKE 1 TO 2 TABLETS BY MOUTH DAILY, Disp: 60 tablet, Rfl: 0   propranolol (INDERAL) 10 MG tablet, Take 1 tablet (10 mg total) by mouth 2 (two) times daily as needed., Disp: 60 tablet, Rfl: 2   Sofosbuvir-Velpatasvir (EPCLUSA) 400-100 MG TABS, Take 1 tablet by mouth daily., Disp: 28 tablet, Rfl: 2   trazodone (DESYREL) 300 MG tablet, Take 1 tablet (300 mg total) by mouth at bedtime., Disp: 30 tablet, Rfl: 2   Vitamin D, Ergocalciferol, (DRISDOL) 1.25 MG (50000 UNIT) CAPS capsule, TAKE 1 CAPSULE BY MOUTH ONCE EVERY 7 DAYS, Disp: 4 capsule, Rfl: 0   valbenazine (INGREZZA) 40 MG capsule, Take 1 capsule (40 mg total) by mouth daily. (Patient not taking: Reported on 08/01/2023), Disp: 30 capsule, Rfl: 2   Review of Systems  Constitutional:  Negative for activity change, appetite change, chills, diaphoresis, fatigue, fever and unexpected weight change.  HENT:  Negative for congestion, rhinorrhea, sinus pressure, sneezing, sore throat and trouble swallowing.   Eyes:  Negative for photophobia and visual disturbance.  Respiratory:  Negative for cough, chest tightness, shortness of breath, wheezing and stridor.   Cardiovascular:  Negative for chest pain, palpitations and leg swelling.  Gastrointestinal:  Negative for abdominal distention, abdominal pain, anal bleeding, blood in stool, constipation, diarrhea, nausea and vomiting.  Genitourinary:  Negative for difficulty urinating, dysuria, flank pain and hematuria.  Musculoskeletal:  Negative for arthralgias, back pain, gait problem, joint swelling and myalgias.  Skin:  Negative for color change, pallor, rash and wound.  Neurological:  Negative for dizziness, tremors, weakness and light-headedness.  Hematological:  Negative for adenopathy. Does not bruise/bleed easily.  Psychiatric/Behavioral:  Negative for agitation, behavioral problems,  confusion, decreased concentration, dysphoric mood and sleep disturbance.        Objective:   Physical Exam Constitutional:      Appearance: He is well-developed.  HENT:     Head: Normocephalic and atraumatic.  Eyes:     Conjunctiva/sclera: Conjunctivae normal.  Cardiovascular:     Rate and Rhythm: Normal rate and regular rhythm.  Pulmonary:     Effort: Pulmonary effort is normal. No respiratory distress.     Breath sounds: No wheezing.  Abdominal:     General: There is no distension.     Palpations: Abdomen is soft.  Musculoskeletal:        General: No tenderness. Normal range of motion.     Cervical back: Normal range of motion and neck supple.  Skin:    General: Skin  is warm and dry.     Coloration: Skin is not pale.     Findings: No erythema or rash.  Neurological:     General: No focal deficit present.     Mental Status: He is alert and oriented to person, place, and time.  Psychiatric:        Mood and Affect: Mood normal.        Behavior: Behavior normal.        Thought Content: Thought content normal.        Judgment: Judgment normal.           Assessment & Plan:   Assessment and Plan    Hepatitis C Completed treatment with Abclusa. Last dose taken in September. Liver damage noted. -Check labs today to assess for Hepatitis C recurrence. -Plan for test of cure in December. -Refer to hepatology for long-term liver care. -Consider liver ultrasound if not recently done.  Vaccinations Received flu vaccine in September. COVID and RSV vaccines recommended. -Schedule appointment for COVID vaccine at this clinic. -Advise to get RSV vaccine at pharmacy.  Follow-up in two months.     NOTE HE WAS TREATED WITH EPCLUSA (AI PROGRAM MADE AN ERROR)  Fibro sure and ultrasound that was done in May have shown evidence of cirrhosis:  With cirrhosis of the liver he needs to be seen by hepatology but he no showed for the appointment we have given him the phone number  so he can get in touch with them again and reschedule

## 2023-08-01 NOTE — Patient Instructions (Signed)
Please call GI to reschedule Phone: [phone]

## 2023-08-04 LAB — COMPLETE METABOLIC PANEL WITH GFR
AG Ratio: 0.8 (calc) — ABNORMAL LOW (ref 1.0–2.5)
ALT: 18 U/L (ref 9–46)
AST: 24 U/L (ref 10–35)
Albumin: 3.5 g/dL — ABNORMAL LOW (ref 3.6–5.1)
Alkaline phosphatase (APISO): 105 U/L (ref 35–144)
BUN: 12 mg/dL (ref 7–25)
CO2: 25 mmol/L (ref 20–32)
Calcium: 8.9 mg/dL (ref 8.6–10.3)
Chloride: 101 mmol/L (ref 98–110)
Creat: 1.08 mg/dL (ref 0.70–1.35)
Globulin: 4.2 g/dL — ABNORMAL HIGH (ref 1.9–3.7)
Glucose, Bld: 81 mg/dL (ref 65–99)
Potassium: 4.3 mmol/L (ref 3.5–5.3)
Sodium: 134 mmol/L — ABNORMAL LOW (ref 135–146)
Total Bilirubin: 1 mg/dL (ref 0.2–1.2)
Total Protein: 7.7 g/dL (ref 6.1–8.1)
eGFR: 76 mL/min/{1.73_m2} (ref >=60)

## 2023-08-04 LAB — HEPATITIS C RNA QUANTITATIVE
HCV Quantitative Log: <1.18 {Log_IU}/mL
HCV RNA, PCR, QN: <15 [IU]/mL

## 2023-08-04 LAB — CBC WITH DIFFERENTIAL/PLATELET
Absolute Lymphocytes: 2755 {cells}/uL (ref 850–3900)
Absolute Monocytes: 423 {cells}/uL (ref 200–950)
Basophils Absolute: 17 {cells}/uL (ref 0–200)
Basophils Relative: 0.3 %
Eosinophils Absolute: 162 {cells}/uL (ref 15–500)
Eosinophils Relative: 2.8 %
HCT: 41.3 % (ref 38.5–50.0)
Hemoglobin: 13.8 g/dL (ref 13.2–17.1)
MCH: 33.3 pg — ABNORMAL HIGH (ref 27.0–33.0)
MCHC: 33.4 g/dL (ref 32.0–36.0)
MCV: 99.5 fL (ref 80.0–100.0)
MPV: 10.3 fL (ref 7.5–12.5)
Monocytes Relative: 7.3 %
Neutro Abs: 2442 {cells}/uL (ref 1500–7800)
Neutrophils Relative %: 42.1 %
Platelets: 105 10*3/uL — ABNORMAL LOW (ref 140–400)
RBC: 4.15 10*6/uL — ABNORMAL LOW (ref 4.20–5.80)
RDW: 13.1 % (ref 11.0–15.0)
Total Lymphocyte: 47.5 %
WBC: 5.8 10*3/uL (ref 3.8–10.8)

## 2023-08-07 ENCOUNTER — Other Ambulatory Visit: Payer: Self-pay | Admitting: Nurse Practitioner

## 2023-08-07 DIAGNOSIS — E559 Vitamin D deficiency, unspecified: Secondary | ICD-10-CM

## 2023-08-12 ENCOUNTER — Ambulatory Visit (INDEPENDENT_AMBULATORY_CARE_PROVIDER_SITE_OTHER): Payer: Managed Care, Other (non HMO)

## 2023-08-12 ENCOUNTER — Other Ambulatory Visit: Payer: Self-pay

## 2023-08-12 DIAGNOSIS — Z23 Encounter for immunization: Secondary | ICD-10-CM | POA: Diagnosis not present

## 2023-08-12 NOTE — Progress Notes (Signed)
Nurse Visit  Curtis Mcdonald 02-24-1956   No Known Allergies   Reviewed allergies with patient.   Medications administered: none  Immunizations administered: COVID-19 [employer])  Patient tolerated well.   Sandie Ano, RN

## 2023-08-14 ENCOUNTER — Other Ambulatory Visit: Payer: Self-pay | Admitting: Nurse Practitioner

## 2023-08-14 DIAGNOSIS — E559 Vitamin D deficiency, unspecified: Secondary | ICD-10-CM

## 2023-08-14 NOTE — Progress Notes (Signed)
BH MD Outpatient Progress Note  08/16/2023 2:26 PM Curtis Mcdonald  MRN:  [id number]  Assessment:  Curtis Mcdonald presents for follow-up evaluation.  At the last visit the patient was prescribed Ingrezza for tardive dyskinesia.  The patient states that he was unable to pick up his Ingrezza due to "insurance issues".  After the visit, the prior authorization was completed, and the patient was notified to pick up his medication.  The patient's psychotic symptoms remain mild and stable.  The patient continues to have intention tremor L>R that interferes with daily activities such as picking up drinking glasses.  He is currently on a low-dose of propranolol and we will increase this to help address his tremor.  He was counseled on the risk of orthostasis and expressed his understanding.  Identifying Information: Curtis Mcdonald is a 67 y.o. male with a history of schizophrenia and anxiety who is an established patient with [facility] for treatment of schizophrenia.  Plan:  # Schizophrenia  Anxiety  tardive dyskinesia Interventions: - Start Ingrezza 40 mg daily - Patient counseled on side effect of potential depression -- Continue Latuda 40 mg daily - Aims of 0 on 1/25, 4/18 ; AIMS of 3 on 9/26 and 11/8 for tongue movements - EKG July 2024 with NSR QTc of 474 (previous Qtc of 480) -Lipids unremarkable in July 2024 - A1c 5.2 in July 2024 -- Continue Buspar 10 mg TID -- Continue hydroxyzine at decreased dose of 25 mg daily (decreased on 4/18) - Will likely discontinue hydroxyzine due to development of tardive dyskinesia   # Sleep Past medication trials: unknown Status of problem: stable Interventions: -- Continue trazodone 300 mg nightly  # Tremor, most likely essential tremor Past medication trials: none Interventions:  -- Increase propranolol from 10 mg twice daily to 20 mg twice daily.  Patient was given contact information for behavioral health clinic and was  instructed to call 911 for emergencies.   Subjective:  Chief Complaint:  Chief Complaint  Patient presents with   Follow-up    Interval History:  Patient reports that he has been doing well since the last visit.  He continues to be active in his faith community and reads the Bible frequently.  He reports unchanged auditory hallucinations, primarily voices that consist of "be on the look out".  He finds the voice helpful.  He denies concerns about safety.  The patient reports a predominantly euthymic mood and denies experiencing any hopelessness or suicidal thoughts.  He states that he has become aware of his tongue movements since the last visit.    Visit Diagnosis:    ICD-10-CM   1. Paranoid schizophrenia (HCC)  F20.0 trazodone (DESYREL) 300 MG tablet    lurasidone (LATUDA) 40 MG TABS tablet    2. Vitamin D deficiency disease  E55.9 Vitamin D, Ergocalciferol, (DRISDOL) 1.25 MG (50000 UNIT) CAPS capsule    3. Tardive dyskinesia  G24.01 valbenazine (INGREZZA) 40 MG capsule    4. Essential tremor  G25.0 propranolol (INDERAL) 20 MG tablet    5. Generalized anxiety disorder  F41.1 busPIRone (BUSPAR) 10 MG tablet    hydrOXYzine (ATARAX) 25 MG tablet         Past Psychiatric History:  Diagnoses: schizophrenia, anxiety, cocaine abuse  Medication trials: unknown Hospitalizations: denies Suicide attempts: denies Hx of abuse: denies Substance use:   -- Denies use of cannabis, cocaine (last used in 2004), or illicit drug use  -- Etoh: denies  -- Tobacco: denies  --  Caffeine: 1 cup once a week  Past Medical History:  Past Medical History:  Diagnosis Date   Anxiety    Chronic hepatitis C without hepatic coma (HCC) 02/04/2023   Paranoid schizophrenia (HCC)    Stroke (HCC) 2017   Vaccine counseling 08/01/2023    Past Surgical History:  Procedure Laterality Date   COLONOSCOPY  2013   WISDOM TOOTH EXTRACTION      Family Psychiatric History: denies  Family History:   Family History  Problem Relation Age of Onset   CAD Neg Hx    Diabetes Neg Hx    Colon cancer Neg Hx    Colon polyps Neg Hx    Esophageal cancer Neg Hx    Rectal cancer Neg Hx    Stomach cancer Neg Hx     Social History:  Social History   Socioeconomic History   Marital status: Single    Spouse name: Not on file   Number of children: Not on file   Years of education: Not on file   Highest education level: GED or equivalent  Occupational History   Occupation: Atm assembly    Comment: [employer]  Tobacco Use   Smoking status: Never   Smokeless tobacco: Never  Vaping Use   Vaping status: Never Used  Substance and Sexual Activity   Alcohol use: No    Comment: fomer    Drug use: No   Sexual activity: Not Currently    Birth control/protection: Abstinence  Other Topics Concern   Not on file  Social History Narrative   Not on file   Social Determinants of Health   Financial Resource Strain: Low Risk  (12/29/2022)   Overall Financial Resource Strain (CARDIA)    Difficulty of Paying Living Expenses: Not hard at all  Food Insecurity: No Food Insecurity (12/29/2022)   Hunger Vital Sign    Worried About Running Out of Food in the Last Year: Never true    Ran Out of Food in the Last Year: Never true  Transportation Needs: Unknown (12/29/2022)   PRAPARE - Administrator, Civil Service (Medical): Not on file    Lack of Transportation (Non-Medical): No  Physical Activity: Unknown (12/29/2022)   Exercise Vital Sign    Days of Exercise per Week: Patient declined    Minutes of Exercise per Session: Not on file  Stress: Stress Concern Present (12/29/2022)   [employer] of Occupational Health - Occupational Stress Questionnaire    Feeling of Stress : To some extent  Social Connections: Moderately Isolated (12/29/2022)   Social Connection and Isolation Panel [NHANES]    Frequency of Communication with Friends and Family: Never    Frequency of Social Gatherings with  Friends and Family: Never    Attends Religious Services: More than 4 times per year    Active Member of [employer] or Organizations: Yes    Attends Engineer, structural: More than 4 times per year    Marital Status: Never married    Allergies: No Known Allergies  Current Medications: Current Outpatient Medications  Medication Sig Dispense Refill   propranolol (INDERAL) 20 MG tablet Take 1 tablet (20 mg total) by mouth 2 (two) times daily. 60 tablet 2   busPIRone (BUSPAR) 10 MG tablet Take 1 tablet (10 mg total) by mouth 3 (three) times daily. 90 tablet 2   Elastic Bandages & Supports (LUMBAR BACK BRACE/SUPPORT PAD) MISC 1 each by Does not apply route daily. PLEASE FAX TO [phone] along with demographics  and insurance information 1 each 0   hydrOXYzine (ATARAX) 25 MG tablet Take 1 tablet (25 mg total) by mouth daily. 30 tablet 2   lurasidone (LATUDA) 40 MG TABS tablet Take 1 tablet (40 mg total) by mouth daily with breakfast. 30 tablet 2   meloxicam (MOBIC) 7.5 MG tablet TAKE 1 TO 2 TABLETS BY MOUTH DAILY 60 tablet 0   Sofosbuvir-Velpatasvir (EPCLUSA) 400-100 MG TABS Take 1 tablet by mouth daily. 28 tablet 2   trazodone (DESYREL) 300 MG tablet Take 1 tablet (300 mg total) by mouth at bedtime. 30 tablet 2   valbenazine (INGREZZA) 40 MG capsule Take 1 capsule (40 mg total) by mouth daily. 30 capsule 2   Vitamin D, Ergocalciferol, (DRISDOL) 1.25 MG (50000 UNIT) CAPS capsule Take 1 capsule (50,000 Units total) by mouth every 7 (seven) days. 12 capsule 2   No current facility-administered medications for this visit.     Objective:  Psychiatric Specialty Exam: Blood pressure 137/86, pulse 64.There is no height or weight on file to calculate BMI.  General Appearance: Casual and Fairly Groomed  Eye Contact:  Good  Speech:  Clear and Coherent and Normal Rate  Volume:  Normal  Mood:   "good"  Affect: Constricted  Thought Content: occasional and fleeting AH and paranoia  Suicidal  Thoughts:  No  Homicidal Thoughts:  No  Thought Process:  Goal Directed and Linear  Orientation:  Full (Time, Place, and Person)    Memory:   Grossly intact  Judgment:  Good  Insight:  Good  Concentration:  Concentration: Good  Recall:  NA  Fund of Knowledge: Good  Language: Good  Psychomotor Activity: Normal  Akathisia:  No  AIMS (if indicated): see above  Assets:  Communication Skills Desire for Improvement Housing Leisure Time Physical Health Transportation  ADL's:  Intact  Cognition: WNL  Sleep:  Good   Physical Exam Constitutional:      Appearance: the patient is not toxic-appearing.  Pulmonary:     Effort: Pulmonary effort is normal.  Neurological:     General: No tremor    Mental Status: the patient is alert and oriented to person, place, and time.   Review of Systems  Respiratory:  Negative for shortness of breath.   Cardiovascular:  Negative for chest pain.  Gastrointestinal:  Negative for abdominal pain, constipation, diarrhea, nausea and vomiting.  Neurological:  Negative for headaches.    Metabolic Disorder Labs: Lab Results  Component Value Date   HGBA1C 5.2 04/10/2023   MPG 100 04/12/2016   No results found for: "PROLACTIN" Lab Results  Component Value Date   CHOL 114 04/10/2023   TRIG 70 04/10/2023   HDL 37 (L) 04/10/2023   CHOLHDL 3.1 04/10/2023   VLDL 14 04/12/2016   LDLCALC 62 04/10/2023   LDLCALC 60 11/20/2022   Lab Results  Component Value Date   TSH 0.330 (L) 04/12/2016    Therapeutic Level Labs: No results found for: "LITHIUM" No results found for: "VALPROATE" No results found for: "CBMZ"  Screenings:  AIMS    Flowsheet Row Clinical Support from 10/06/2020 in [facility]  AIMS Total Score 0      GAD-7    Flowsheet Row Office Visit from 06/14/2023 in [facility] - A Dept Of [place]. [facility] Office Visit from 04/10/2023 in [facility] - A  Dept Of Eligha Bridegroom. [facility] Office Visit from 01/02/2023 in [facility] - A Dept Of [place]. [facility] Office Visit from 11/20/2022 in [facility] - A Dept Of Eligha Bridegroom. [facility] Video Visit from 05/11/2022 in [facility]  Total GAD-7 Score 6 7 6 10 9       PHQ2-9    Flowsheet Row Office Visit from 06/14/2023 in [facility] [place] - A Dept Of [place]. [facility] Office Visit from 04/10/2023 in [facility] - A Dept Of Eligha Bridegroom. [facility] Office Visit from 04/09/2023 in [facility] - A Dept Of [place]. [facility] Office Visit from 02/04/2023 in [facility] - A Dept Of [place]. [facility] Office Visit from 01/02/2023 in [facility] [place] - A Dept Of Eligha Bridegroom. [facility]  PHQ-2 Total Score 1 5 0 1 3  PHQ-9 Total Score 5 6 -- -- 5      Flowsheet Row ED from 05/13/2022 in [facility] Office Visit from 02/01/2022 in [facility] Clinical Support from 02/20/2021 in [facility]  C-SSRS RISK CATEGORY No Risk No Risk No Risk       Collaboration of Care: none   I provided 30 minutes of face-to-face time during this encounter.  Carlyn Reichert, MD 08/16/2023, 2:26 PM

## 2023-08-15 ENCOUNTER — Ambulatory Visit (INDEPENDENT_AMBULATORY_CARE_PROVIDER_SITE_OTHER): Payer: 59 | Admitting: Student

## 2023-08-15 VITALS — BP 137/86 | HR 64

## 2023-08-15 DIAGNOSIS — G250 Essential tremor: Secondary | ICD-10-CM | POA: Diagnosis not present

## 2023-08-15 DIAGNOSIS — E559 Vitamin D deficiency, unspecified: Secondary | ICD-10-CM | POA: Diagnosis not present

## 2023-08-15 DIAGNOSIS — F411 Generalized anxiety disorder: Secondary | ICD-10-CM

## 2023-08-15 DIAGNOSIS — F200 Paranoid schizophrenia: Secondary | ICD-10-CM

## 2023-08-15 DIAGNOSIS — G2401 Drug induced subacute dyskinesia: Secondary | ICD-10-CM | POA: Diagnosis not present

## 2023-08-16 MED ORDER — VITAMIN D (ERGOCALCIFEROL) 1.25 MG (50000 UNIT) PO CAPS: 50000.0000 [IU] | ORAL_CAPSULE | ORAL | 2 refills | Status: DC

## 2023-08-16 MED ORDER — LURASIDONE HCL 40 MG PO TABS: 40.0000 mg | ORAL_TABLET | Freq: Every day | ORAL | 2 refills | Status: DC

## 2023-08-16 MED ORDER — HYDROXYZINE HCL 25 MG PO TABS: 25.0000 mg | ORAL_TABLET | Freq: Every day | ORAL | 2 refills | Status: DC

## 2023-08-16 MED ORDER — TRAZODONE HCL 300 MG PO TABS: 300.0000 mg | ORAL_TABLET | Freq: Every evening | ORAL | 2 refills | Status: DC

## 2023-08-16 MED ORDER — BUSPIRONE HCL 10 MG PO TABS: 10.0000 mg | ORAL_TABLET | Freq: Three times a day (TID) | ORAL | 2 refills | Status: DC

## 2023-08-16 MED ORDER — VALBENAZINE TOSYLATE 40 MG PO CAPS: 40.0000 mg | ORAL_CAPSULE | Freq: Every day | ORAL | 2 refills | Status: DC

## 2023-08-16 MED ORDER — PROPRANOLOL HCL 20 MG PO TABS: 20.0000 mg | ORAL_TABLET | Freq: Two times a day (BID) | ORAL | 2 refills | Status: DC

## 2023-10-07 NOTE — Telephone Encounter (Signed)
Sent!

## 2023-10-10 ENCOUNTER — Ambulatory Visit (INDEPENDENT_AMBULATORY_CARE_PROVIDER_SITE_OTHER): Payer: Commercial Managed Care - HMO | Admitting: Student

## 2023-10-10 ENCOUNTER — Other Ambulatory Visit: Payer: Self-pay | Admitting: Family Medicine

## 2023-10-10 VITALS — BP 124/82 | HR 80

## 2023-10-10 DIAGNOSIS — G2401 Drug induced subacute dyskinesia: Secondary | ICD-10-CM

## 2023-10-10 DIAGNOSIS — G250 Essential tremor: Secondary | ICD-10-CM | POA: Diagnosis not present

## 2023-10-10 DIAGNOSIS — F200 Paranoid schizophrenia: Secondary | ICD-10-CM

## 2023-10-10 DIAGNOSIS — Z79899 Other long term (current) drug therapy: Secondary | ICD-10-CM

## 2023-10-10 DIAGNOSIS — G8929 Other chronic pain: Secondary | ICD-10-CM

## 2023-10-10 DIAGNOSIS — F411 Generalized anxiety disorder: Secondary | ICD-10-CM

## 2023-10-10 MED ORDER — TRAZODONE HCL 300 MG PO TABS: 300.0000 mg | ORAL_TABLET | Freq: Every evening | ORAL | 2 refills | Status: DC

## 2023-10-10 MED ORDER — LURASIDONE HCL 40 MG PO TABS: 40.0000 mg | ORAL_TABLET | Freq: Every day | ORAL | 2 refills | Status: DC

## 2023-10-10 MED ORDER — VALBENAZINE TOSYLATE 80 MG PO CAPS: 80.0000 mg | ORAL_CAPSULE | Freq: Every day | ORAL | 2 refills | Status: DC

## 2023-10-10 MED ORDER — BUSPIRONE HCL 10 MG PO TABS: 10.0000 mg | ORAL_TABLET | Freq: Three times a day (TID) | ORAL | 2 refills | Status: DC

## 2023-10-10 MED ORDER — PROPRANOLOL HCL 20 MG PO TABS: 20.0000 mg | ORAL_TABLET | Freq: Two times a day (BID) | ORAL | 2 refills | Status: DC

## 2023-10-11 NOTE — Progress Notes (Signed)
 BH MD Outpatient Progress Note  10/13/2023 3:57 AM Curtis Mcdonald  MRN:  [id number]  Assessment:  CORDARRELL SANE presents for follow-up evaluation.  Today the patient reports that he was able to pick up Ingrezza  and has noticed an improvement in his symptoms of tardive dyskinesia.  There is no TD evident on exam today, AIMS of 0.  The patient reports continued subjective sensation of his tongue making uncontrolled movements in his mouth.  He reports this occurs several times per day and is quite bothersome.  Plan to increase the dose of Ingrezza  to help with this.  Identifying Information: Curtis Mcdonald is a 68 y.o. male with a history of schizophrenia and anxiety who is an established patient with [facility] for treatment of schizophrenia.  Plan:  # Schizophrenia  Anxiety  tardive dyskinesia Interventions: - Increase Ingrezza  from 40 mg daily to 80 mg daily - Patient has been counseled on side effect of potential depression -- Continue Latuda  40 mg daily - Aims of 0 on 1/25, 4/18 ; AIMS of 3 on 9/26 and 11/8 for tongue movements ; AIMS 0 on 10/11/2023 -- Continue Buspar  10 mg TID - Discussed that he should discontinue hydroxyzine  because of risk of worsening TD  #Long term use of antipsychotic medication - EKG July 2024 with NSR QTc of 474 (previous Qtc of 480), message sent to primary care physician to repeat this at his upcoming visit in March - Lipids unremarkable in July 2024 - A1c 5.2 in July 2024  # Sleep Past medication trials: unknown Status of problem: stable Interventions: -- Continue trazodone  300 mg nightly  # Tremor, most likely essential tremor Past medication trials: none Interventions:  -- Continue propranolol  20 mg twice daily.  Patient was given contact information for behavioral health clinic and was instructed to call 911 for emergencies.   Subjective:  Chief Complaint:  Chief Complaint  Patient presents with   Follow-up    Interval  History:  Patient reports that he has been doing well since the last visit.  He has been active in his faith community and continues to read the Bible regularly.  He denies experiencing any suicidal thoughts.  He denies experiencing any depression.  He reports experiencing infrequent auditory hallucinations and paranoia, which are unchanged in character, intensity, and frequency compared to previous visits. (The patient has previously declined increase in Latuda ).  He does not find these bothersome and feels that he can keep himself and others safe.     Visit Diagnosis:    ICD-10-CM   1. Tardive dyskinesia  G24.01 valbenazine  (INGREZZA ) 80 MG capsule    2. Paranoid schizophrenia (HCC)  F20.0 trazodone  (DESYREL ) 300 MG tablet    lurasidone  (LATUDA ) 40 MG TABS tablet    3. Essential tremor  G25.0 propranolol  (INDERAL ) 20 MG tablet    4. Generalized anxiety disorder  F41.1 busPIRone  (BUSPAR ) 10 MG tablet         Past Psychiatric History:  Diagnoses: schizophrenia, anxiety, cocaine abuse  Medication trials: unknown Hospitalizations: denies Suicide attempts: denies Hx of abuse: denies Substance use:   -- Denies use of cannabis, cocaine (last used in 2004), or illicit drug use  -- Etoh: denies  -- Tobacco: denies  -- Caffeine: 1 cup once a week  Past Medical History:  Past Medical History:  Diagnosis Date   Anxiety    Chronic hepatitis C without hepatic coma (HCC) 02/04/2023   Paranoid schizophrenia (HCC)    Stroke (HCC)  2017   Vaccine counseling 08/01/2023    Past Surgical History:  Procedure Laterality Date   COLONOSCOPY  2013   WISDOM TOOTH EXTRACTION      Family Psychiatric History: denies  Family History:  Family History  Problem Relation Age of Onset   CAD Neg Hx    Diabetes Neg Hx    Colon cancer Neg Hx    Colon polyps Neg Hx    Esophageal cancer Neg Hx    Rectal cancer Neg Hx    Stomach cancer Neg Hx     Social History:  Social History    Socioeconomic History   Marital status: Single    Spouse name: Not on file   Number of children: Not on file   Years of education: Not on file   Highest education level: GED or equivalent  Occupational History   Occupation: Atm assembly    Comment: [employer]  Tobacco Use   Smoking status: Never   Smokeless tobacco: Never  Vaping Use   Vaping status: Never Used  Substance and Sexual Activity   Alcohol use: No    Comment: fomer    Drug use: No   Sexual activity: Not Currently    Birth control/protection: Abstinence  Other Topics Concern   Not on file  Social History Narrative   Not on file   Social Drivers of Health   Financial Resource Strain: Low Risk  (12/29/2022)   Overall Financial Resource Strain (CARDIA)    Difficulty of Paying Living Expenses: Not hard at all  Food Insecurity: No Food Insecurity (12/29/2022)   Hunger Vital Sign    Worried About Running Out of Food in the Last Year: Never true    Ran Out of Food in the Last Year: Never true  Transportation Needs: Unknown (12/29/2022)   PRAPARE - Administrator, Civil Service (Medical): Not on file    Lack of Transportation (Non-Medical): No  Physical Activity: Unknown (12/29/2022)   Exercise Vital Sign    Days of Exercise per Week: Patient declined    Minutes of Exercise per Session: Not on file  Stress: Stress Concern Present (12/29/2022)   [employer] of Occupational Health - Occupational Stress Questionnaire    Feeling of Stress : To some extent  Social Connections: Moderately Isolated (12/29/2022)   Social Connection and Isolation Panel [NHANES]    Frequency of Communication with Friends and Family: Never    Frequency of Social Gatherings with Friends and Family: Never    Attends Religious Services: More than 4 times per year    Active Member of [employer] or Organizations: Yes    Attends Engineer, Structural: More than 4 times per year    Marital Status: Never married    Allergies: No  Known Allergies  Current Medications: Current Outpatient Medications  Medication Sig Dispense Refill   valbenazine  (INGREZZA ) 80 MG capsule Take 1 capsule (80 mg total) by mouth daily. 30 capsule 2   busPIRone  (BUSPAR ) 10 MG tablet Take 1 tablet (10 mg total) by mouth 3 (three) times daily. 90 tablet 2   Elastic Bandages & Supports (LUMBAR BACK BRACE/SUPPORT PAD) MISC 1 each by Does not apply route daily. PLEASE FAX TO [phone] along with demographics and insurance information 1 each 0   lurasidone  (LATUDA ) 40 MG TABS tablet Take 1 tablet (40 mg total) by mouth daily with breakfast. 30 tablet 2   meloxicam  (MOBIC ) 7.5 MG tablet TAKE 1-2 TABLETS BY MOUTH DAILY 60 tablet 0  propranolol  (INDERAL ) 20 MG tablet Take 1 tablet (20 mg total) by mouth 2 (two) times daily. 60 tablet 2   Sofosbuvir -Velpatasvir  (EPCLUSA ) 400-100 MG TABS Take 1 tablet by mouth daily. 28 tablet 2   trazodone  (DESYREL ) 300 MG tablet Take 1 tablet (300 mg total) by mouth at bedtime. 30 tablet 2   Vitamin D , Ergocalciferol , (DRISDOL ) 1.25 MG (50000 UNIT) CAPS capsule Take 1 capsule (50,000 Units total) by mouth every 7 (seven) days. 12 capsule 2   No current facility-administered medications for this visit.     Objective:  Psychiatric Specialty Exam: Blood pressure 124/82, pulse 80.There is no height or weight on file to calculate BMI.  General Appearance: Casual and Fairly Groomed  Eye Contact:  Good  Speech:  Clear and Coherent and Normal Rate  Volume:  Normal  Mood:   good  Affect: Constricted  Thought Content: occasional and fleeting AH and paranoia  Suicidal Thoughts:  No  Homicidal Thoughts:  No  Thought Process:  Goal Directed and Linear  Orientation:  Full (Time, Place, and Person)    Memory:   Grossly intact  Judgment:  Good  Insight:  Good  Concentration:  Concentration: Good  Recall:  NA  Fund of Knowledge: Good  Language: Good  Psychomotor Activity: Normal  Akathisia:  No  AIMS (if  indicated): see above  Assets:  Communication Skills Desire for Improvement Housing Leisure Time Physical Health Transportation  ADL's:  Intact  Cognition: WNL  Sleep:  Good   Physical Exam Constitutional:      Appearance: the patient is not toxic-appearing.  Pulmonary:     Effort: Pulmonary effort is normal.  Neurological:     General: No tremor    Mental Status: the patient is alert and oriented to person, place, and time.   Review of Systems  Respiratory:  Negative for shortness of breath.   Cardiovascular:  Negative for chest pain.  Gastrointestinal:  Negative for abdominal pain, constipation, diarrhea, nausea and vomiting.  Neurological:  Negative for headaches.    Metabolic Disorder Labs: Lab Results  Component Value Date   HGBA1C 5.2 04/10/2023   MPG 100 04/12/2016   No results found for: PROLACTIN Lab Results  Component Value Date   CHOL 114 04/10/2023   TRIG 70 04/10/2023   HDL 37 (L) 04/10/2023   CHOLHDL 3.1 04/10/2023   VLDL 14 04/12/2016   LDLCALC 62 04/10/2023   LDLCALC 60 11/20/2022   Lab Results  Component Value Date   TSH 0.330 (L) 04/12/2016    Therapeutic Level Labs: No results found for: LITHIUM No results found for: VALPROATE No results found for: CBMZ  Screenings:  AIMS    Flowsheet Row Clinical Support from 10/06/2020 in [facility]  AIMS Total Score 0      GAD-7    Flowsheet Row Office Visit from 06/14/2023 in [facility] - A Dept Of [place]. [facility] Office Visit from 04/10/2023 in [facility] - A Dept Of [place]. [facility] Office Visit from 01/02/2023 in [facility] [place] - A Dept Of [place]. [facility] Office Visit from 11/20/2022 in [facility] - A Dept Of [place]. [facility] Video Visit from 05/11/2022 in [facility]  Total GAD-7 Score 6 7 6 10  9       PHQ2-9    Flowsheet Row Office Visit from 06/14/2023  in [facility] - A Dept Of [place]. [facility] Office Visit from 04/10/2023 in [facility] [place] - A Dept Of [place]. [facility] Office Visit from 04/09/2023 in [facility] - A Dept Of [place]. [facility] Office Visit from 02/04/2023 in [facility] - A Dept Of [place]. [facility] Office Visit from 01/02/2023 in [facility] [place] - A Dept Of [place]. [facility]  PHQ-2 Total Score 1 5 0 1 3  PHQ-9 Total Score 5 6 -- -- 5      Flowsheet Row ED from 05/13/2022 in [facility] at [facility] Office Visit from 02/01/2022 in [facility] Clinical Support from 02/20/2021 in [facility]  C-SSRS RISK CATEGORY No Risk No Risk No Risk       Collaboration of Care: none   I provided 30 minutes of face-to-face time during this encounter.  Karleen Kaufmann, MD 10/13/2023, 3:57 AM

## 2023-10-14 DIAGNOSIS — Z79899 Other long term (current) drug therapy: Secondary | ICD-10-CM | POA: Insufficient documentation

## 2023-10-20 NOTE — Progress Notes (Signed)
 Subjective:  Chief complaint follow-up for chronic hepatitis C without hepatic coma status post treatment   Patient ID: Curtis Mcdonald, male    DOB: 1956-06-13, 68 y.o.   MRN: [id number]  HPI  Discussed the use of AI scribe software for clinical note transcription with the patient, who gave verbal consent to proceed.  History of Present Illness   The patient, with a history of Hepatitis C, presents for a follow-up visit after completing a three-month treatment course. The last dose was taken in September, and the patient is due for a sustained virologic response (SVR) check in December. The patient denies any history of intravenous drug use, tattoos, or blood transfusions, which are common risk factors for Hepatitis C transmission. The patient speculates that the infection could have been sexually transmitted, but they have not been sexually active for a while. The patient has received necessary vaccinations, including COVID-19 and flu shots, and an RSV vaccine at a local pharmacy.       Past Medical History:  Diagnosis Date   Anxiety    Chronic hepatitis C without hepatic coma (HCC) 02/04/2023   Paranoid schizophrenia (HCC)    Stroke (HCC) 2017   Vaccine counseling 08/01/2023    Past Surgical History:  Procedure Laterality Date   COLONOSCOPY  2013   WISDOM TOOTH EXTRACTION      Family History  Problem Relation Age of Onset   CAD Neg Hx    Diabetes Neg Hx    Colon cancer Neg Hx    Colon polyps Neg Hx    Esophageal cancer Neg Hx    Rectal cancer Neg Hx    Stomach cancer Neg Hx       Social History   Socioeconomic History   Marital status: Single    Spouse name: Not on file   Number of children: Not on file   Years of education: Not on file   Highest education level: GED or equivalent  Occupational History   Occupation: Atm assembly    Comment: [employer]  Tobacco Use   Smoking status: Never   Smokeless tobacco: Never  Vaping Use   Vaping status: Never Used   Substance and Sexual Activity   Alcohol use: No    Comment: fomer    Drug use: No   Sexual activity: Not Currently    Birth control/protection: Abstinence  Other Topics Concern   Not on file  Social History Narrative   Not on file   Social Drivers of Health   Financial Resource Strain: Low Risk  (12/29/2022)   Overall Financial Resource Strain (CARDIA)    Difficulty of Paying Living Expenses: Not hard at all  Food Insecurity: No Food Insecurity (12/29/2022)   Hunger Vital Sign    Worried About Running Out of Food in the Last Year: Never true    Ran Out of Food in the Last Year: Never true  Transportation Needs: Unknown (12/29/2022)   PRAPARE - Administrator, Civil Service (Medical): Not on file    Lack of Transportation (Non-Medical): No  Physical Activity: Unknown (12/29/2022)   Exercise Vital Sign    Days of Exercise per Week: Patient declined    Minutes of Exercise per Session: Not on file  Stress: Stress Concern Present (12/29/2022)   [employer] of Occupational Health - Occupational Stress Questionnaire    Feeling of Stress : To some extent  Social Connections: Moderately Isolated (12/29/2022)   Social Connection and Isolation Panel [NHANES]  Frequency of Communication with Friends and Family: Never    Frequency of Social Gatherings with Friends and Family: Never    Attends Religious Services: More than 4 times per year    Active Member of Clubs or Organizations: Yes    Attends Engineer, Structural: More than 4 times per year    Marital Status: Never married    No Known Allergies   Current Outpatient Medications:    busPIRone  (BUSPAR ) 10 MG tablet, Take 1 tablet (10 mg total) by mouth 3 (three) times daily., Disp: 90 tablet, Rfl: 2   Elastic Bandages & Supports (LUMBAR BACK BRACE/SUPPORT PAD) MISC, 1 each by Does not apply route daily. PLEASE FAX TO [phone] along with demographics and insurance information, Disp: 1 each, Rfl: 0    lurasidone  (LATUDA ) 40 MG TABS tablet, Take 1 tablet (40 mg total) by mouth daily with breakfast., Disp: 30 tablet, Rfl: 2   meloxicam  (MOBIC ) 7.5 MG tablet, TAKE 1-2 TABLETS BY MOUTH DAILY, Disp: 60 tablet, Rfl: 0   propranolol  (INDERAL ) 20 MG tablet, Take 1 tablet (20 mg total) by mouth 2 (two) times daily., Disp: 60 tablet, Rfl: 2   Sofosbuvir -Velpatasvir  (EPCLUSA ) 400-100 MG TABS, Take 1 tablet by mouth daily., Disp: 28 tablet, Rfl: 2   trazodone  (DESYREL ) 300 MG tablet, Take 1 tablet (300 mg total) by mouth at bedtime., Disp: 30 tablet, Rfl: 2   valbenazine  (INGREZZA ) 80 MG capsule, Take 1 capsule (80 mg total) by mouth daily., Disp: 30 capsule, Rfl: 2   Vitamin D , Ergocalciferol , (DRISDOL ) 1.25 MG (50000 UNIT) CAPS capsule, Take 1 capsule (50,000 Units total) by mouth every 7 (seven) days., Disp: 12 capsule, Rfl: 2    Review of Systems  Constitutional:  Negative for activity change, appetite change, chills, diaphoresis, fatigue, fever and unexpected weight change.  HENT:  Negative for congestion, rhinorrhea, sinus pressure, sneezing, sore throat and trouble swallowing.   Eyes:  Negative for photophobia and visual disturbance.  Respiratory:  Negative for cough, chest tightness, shortness of breath, wheezing and stridor.   Cardiovascular:  Negative for chest pain, palpitations and leg swelling.  Gastrointestinal:  Negative for abdominal distention, abdominal pain, anal bleeding, blood in stool, constipation, diarrhea, nausea and vomiting.  Genitourinary:  Negative for difficulty urinating, dysuria, flank pain and hematuria.  Musculoskeletal:  Negative for arthralgias, back pain, gait problem, joint swelling and myalgias.  Skin:  Negative for color change, pallor, rash and wound.  Neurological:  Negative for dizziness, tremors, weakness and light-headedness.  Hematological:  Negative for adenopathy. Does not bruise/bleed easily.  Psychiatric/Behavioral:  Negative for agitation, behavioral  problems, confusion, decreased concentration, dysphoric mood and sleep disturbance.        Objective:   Physical Exam Constitutional:      Appearance: He is well-developed.  HENT:     Head: Normocephalic and atraumatic.  Eyes:     Conjunctiva/sclera: Conjunctivae normal.  Cardiovascular:     Rate and Rhythm: Normal rate and regular rhythm.  Pulmonary:     Effort: Pulmonary effort is normal. No respiratory distress.     Breath sounds: No wheezing.  Abdominal:     General: There is no distension.     Palpations: Abdomen is soft.  Musculoskeletal:        General: No tenderness. Normal range of motion.     Cervical back: Normal range of motion and neck supple.  Skin:    General: Skin is warm and dry.     Coloration: Skin is  not pale.     Findings: No erythema or rash.  Neurological:     General: No focal deficit present.     Mental Status: He is alert and oriented to person, place, and time.  Psychiatric:        Mood and Affect: Mood normal.        Behavior: Behavior normal.        Thought Content: Thought content normal.        Judgment: Judgment normal.           Assessment & Plan:   Assessment and Plan    Chronic hepatitis C without hepatic coma:  Completed treatment in September. Today's visit is for SVR12 to confirm cure. -Check Hepatitis C viral load today.  General Health Maintenance Vaccinations up to date including COVID and flu. RSV vaccine received at [facility]. -No further action needed at this time.  Sexual Health Discussed potential risk of Hepatitis C transmission via sexual activity. Patient currently not sexually active. -No further action needed at this time. but happy to offer PrEP if he becomes active again

## 2023-10-21 ENCOUNTER — Ambulatory Visit (INDEPENDENT_AMBULATORY_CARE_PROVIDER_SITE_OTHER): Payer: Commercial Managed Care - HMO | Admitting: Infectious Disease

## 2023-10-21 ENCOUNTER — Other Ambulatory Visit: Payer: Self-pay

## 2023-10-21 VITALS — BP 144/88 | HR 67 | Temp 97.5°F | Wt 186.0 lb

## 2023-10-21 DIAGNOSIS — G250 Essential tremor: Secondary | ICD-10-CM

## 2023-10-21 DIAGNOSIS — Z7185 Encounter for immunization safety counseling: Secondary | ICD-10-CM | POA: Diagnosis not present

## 2023-10-21 DIAGNOSIS — B182 Chronic viral hepatitis C: Secondary | ICD-10-CM

## 2023-10-24 ENCOUNTER — Telehealth: Payer: Self-pay

## 2023-10-24 LAB — HCV RNA, QUANT REAL-TIME PCR W/REFLEX
HCV RNA, PCR, QN (Log): <1.18 {Log}
HCV RNA, PCR, QN: <15 [IU]/mL

## 2023-10-24 NOTE — Telephone Encounter (Signed)
Called patient to relay results, no answer. Left HIPAA compliant voicemail stating MyChart message would be sent and to call with any questions.   Sandie Ano, RN

## 2023-10-24 NOTE — Telephone Encounter (Signed)
-----   Message from [place] sent at 10/24/2023 11:44 AM EST ----- Regarding: FW: Hcv is cured ----- Message ----- From: Janace Hoard Lab Results In Sent: 10/24/2023  12:03 AM EST To: Randall Hiss, MD

## 2023-11-27 ENCOUNTER — Other Ambulatory Visit: Payer: Self-pay | Admitting: Family Medicine

## 2023-11-27 DIAGNOSIS — M545 Low back pain, unspecified: Secondary | ICD-10-CM

## 2023-11-28 ENCOUNTER — Other Ambulatory Visit: Payer: Self-pay | Admitting: Family Medicine

## 2023-11-28 DIAGNOSIS — M545 Low back pain, unspecified: Secondary | ICD-10-CM

## 2023-11-28 MED ORDER — MELOXICAM 7.5 MG PO TABS: 7.5000 mg | ORAL_TABLET | Freq: Every day | ORAL | 0 refills | Status: DC

## 2023-11-28 NOTE — Telephone Encounter (Signed)
 duplicate

## 2023-11-29 NOTE — Telephone Encounter (Signed)
Patient identified by name and date of birth.  Voicemail left informing patent of approval for refill request per DPR.

## 2023-12-10 ENCOUNTER — Ambulatory Visit (INDEPENDENT_AMBULATORY_CARE_PROVIDER_SITE_OTHER): Payer: Commercial Managed Care - HMO | Admitting: Student

## 2023-12-10 ENCOUNTER — Ambulatory Visit: Payer: Commercial Managed Care - HMO | Admitting: Family Medicine

## 2023-12-10 DIAGNOSIS — F200 Paranoid schizophrenia: Secondary | ICD-10-CM | POA: Diagnosis not present

## 2023-12-10 DIAGNOSIS — G2401 Drug induced subacute dyskinesia: Secondary | ICD-10-CM

## 2023-12-10 DIAGNOSIS — G250 Essential tremor: Secondary | ICD-10-CM | POA: Diagnosis not present

## 2023-12-10 DIAGNOSIS — F411 Generalized anxiety disorder: Secondary | ICD-10-CM | POA: Diagnosis not present

## 2023-12-10 MED ORDER — VALBENAZINE TOSYLATE 40 MG PO CAPS: 40.0000 mg | ORAL_CAPSULE | Freq: Every day | ORAL | 2 refills | Status: DC

## 2023-12-12 ENCOUNTER — Ambulatory Visit: Payer: Managed Care, Other (non HMO) | Admitting: Family Medicine

## 2023-12-12 MED ORDER — BUSPIRONE HCL 10 MG PO TABS: 10.0000 mg | ORAL_TABLET | Freq: Three times a day (TID) | ORAL | 2 refills | Status: AC

## 2023-12-12 MED ORDER — LURASIDONE HCL 40 MG PO TABS: 40.0000 mg | ORAL_TABLET | Freq: Every day | ORAL | 2 refills | Status: DC

## 2023-12-12 MED ORDER — PROPRANOLOL HCL 20 MG PO TABS: 20.0000 mg | ORAL_TABLET | Freq: Two times a day (BID) | ORAL | 2 refills | Status: DC

## 2023-12-12 MED ORDER — TRAZODONE HCL 300 MG PO TABS: 300.0000 mg | ORAL_TABLET | Freq: Every evening | ORAL | 2 refills | Status: DC

## 2023-12-12 NOTE — Progress Notes (Signed)
 BH MD Outpatient Progress Note  12/12/2023 1:22 PM Curtis Mcdonald  MRN:  [id number]  Assessment:  Curtis Mcdonald presents for follow-up evaluation.  At the last visit the patient's Ingrezza was increased to help with involuntary tongue movements.  Today, the patient presents with high amplitude rhythmic tremor of the right hand that is quite pronounced.  His gait is noted to be shuffling and there is minimal arm swing.  Cogwheeling evident on upper extremity examination.  Signs and symptoms are consistent with parkinsonism.  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  Plan to decrease Ingrezza back to 40 mg and reassess in 4 weeks.  Identifying Information: Curtis Mcdonald is a 68 y.o. male with a history of schizophrenia and anxiety who is an established patient with [facility] for treatment of schizophrenia.  Plan:  # Schizophrenia  Anxiety  tardive dyskinesia Interventions: - Decrease Ingrezza from 80 mg daily to 40 mg daily, based on possible side effect of drug-induced parkinsonism -- Continue Latuda 40 mg daily - Aims of 0 on 1/25, 4/18 ; AIMS of 3 on 9/26 and 11/8 for tongue movements ; AIMS 0 on 10/11/2023 ; parkinsonism noted on 12/10/2023 -- Continue Buspar 10 mg TID  #Long term use of antipsychotic medication - EKG July 2024 with NSR QTc of 474 (previous Qtc of 480), message sent to primary care physician to repeat this at his upcoming visit this month - Lipids unremarkable in July 2024 - A1c 5.2 in July 2024  # Sleep Past medication trials: unknown Status of problem: stable Interventions: -- Continue trazodone 300 mg nightly  # Tremor, most likely essential tremor Past medication trials: none Interventions:  -- Continue propranolol 20 mg twice daily.  Patient was given contact information for  behavioral health clinic and was instructed to call 911 for emergencies.   Subjective:  Chief Complaint:  Chief Complaint  Patient presents with   Follow-up    Interval History:  The patient does not appear bothered by the tremor in his right hand.  He denies having any issues resulting from it.  He reports that he continues to go to church.  He continues to report an occasional auditory hallucination of "be on the look out".  He denies experiencing any paranoia.  He denies experiencing any depression or suicidal thoughts.  The patient was noted to be malodorous today, which is a new development.  Concerning for him not showering regularly.  He states that nothing has changed in his activities of daily living.  I called the patient's sister (who the patient feels is his closest social support).  Curtis Mcdonald at [phone].  She states that she talks on the phone to the patient 1 time per month.  She feels that he has been doing well the past several times he has called her.   Visit Diagnosis:    ICD-10-CM   1. Tardive dyskinesia  G24.01 valbenazine (INGREZZA) 40 MG capsule    2. Paranoid schizophrenia (HCC)  F20.0          Past Psychiatric History:  Diagnoses: schizophrenia, anxiety, cocaine abuse  Medication trials: unknown Hospitalizations: denies Suicide attempts: denies Hx of abuse: denies Substance use:   -- Denies use of cannabis, cocaine (last used in 2004), or illicit drug use  -- Etoh: denies  -- Tobacco: denies  -- Caffeine:  1 cup once a week  Past Medical History:  Past Medical History:  Diagnosis Date   Anxiety    Chronic hepatitis C without hepatic coma (HCC) 02/04/2023   Paranoid schizophrenia (HCC)    Stroke (HCC) 2017   Vaccine counseling 08/01/2023    Past Surgical History:  Procedure Laterality Date   COLONOSCOPY  2013   WISDOM TOOTH EXTRACTION      Family Psychiatric History: denies  Family History:  Family History  Problem Relation Age of Onset    CAD Neg Hx    Diabetes Neg Hx    Colon cancer Neg Hx    Colon polyps Neg Hx    Esophageal cancer Neg Hx    Rectal cancer Neg Hx    Stomach cancer Neg Hx     Social History:  Social History   Socioeconomic History   Marital status: Single    Spouse name: Not on file   Number of children: Not on file   Years of education: Not on file   Highest education level: GED or equivalent  Occupational History   Occupation: Atm assembly    Comment: [employer]  Tobacco Use   Smoking status: Never   Smokeless tobacco: Never  Vaping Use   Vaping status: Never Used  Substance and Sexual Activity   Alcohol use: No    Comment: fomer    Drug use: No   Sexual activity: Not Currently    Birth control/protection: Abstinence  Other Topics Concern   Not on file  Social History Narrative   Not on file   Social Drivers of Health   Financial Resource Strain: Low Risk  (12/29/2022)   Overall Financial Resource Strain (CARDIA)    Difficulty of Paying Living Expenses: Not hard at all  Food Insecurity: No Food Insecurity (12/29/2022)   Hunger Vital Sign    Worried About Running Out of Food in the Last Year: Never true    Ran Out of Food in the Last Year: Never true  Transportation Needs: Unknown (12/29/2022)   PRAPARE - Administrator, Civil Service (Medical): Not on file    Lack of Transportation (Non-Medical): No  Physical Activity: Unknown (12/29/2022)   Exercise Vital Sign    Days of Exercise per Week: Patient declined    Minutes of Exercise per Session: Not on file  Stress: Stress Concern Present (12/29/2022)   [employer] of Occupational Health - Occupational Stress Questionnaire    Feeling of Stress : To some extent  Social Connections: Moderately Isolated (12/29/2022)   Social Connection and Isolation Panel [NHANES]    Frequency of Communication with Friends and Family: Never    Frequency of Social Gatherings with Friends and Family: Never    Attends Religious  Services: More than 4 times per year    Active Member of [employer] or Organizations: Yes    Attends Engineer, structural: More than 4 times per year    Marital Status: Never married    Allergies: No Known Allergies  Current Medications: Current Outpatient Medications  Medication Sig Dispense Refill   valbenazine (INGREZZA) 40 MG capsule Take 1 capsule (40 mg total) by mouth daily. 30 capsule 2   busPIRone (BUSPAR) 10 MG tablet Take 1 tablet (10 mg total) by mouth 3 (three) times daily. 90 tablet 2   Elastic Bandages & Supports (LUMBAR BACK BRACE/SUPPORT PAD) MISC 1 each by Does not apply route daily. PLEASE FAX TO [phone] along with demographics and insurance information 1  each 0   lurasidone (LATUDA) 40 MG TABS tablet Take 1 tablet (40 mg total) by mouth daily with breakfast. 30 tablet 2   meloxicam (MOBIC) 7.5 MG tablet Take 1-2 tablets (7.5-15 mg total) by mouth daily. 60 tablet 0   propranolol (INDERAL) 20 MG tablet Take 1 tablet (20 mg total) by mouth 2 (two) times daily. 60 tablet 2   Sofosbuvir-Velpatasvir (EPCLUSA) 400-100 MG TABS Take 1 tablet by mouth daily. (Patient not taking: Reported on 10/21/2023) 28 tablet 2   trazodone (DESYREL) 300 MG tablet Take 1 tablet (300 mg total) by mouth at bedtime. 30 tablet 2   Vitamin D, Ergocalciferol, (DRISDOL) 1.25 MG (50000 UNIT) CAPS capsule Take 1 capsule (50,000 Units total) by mouth every 7 (seven) days. 12 capsule 2   No current facility-administered medications for this visit.     Objective:  Psychiatric Specialty Exam: There were no vitals taken for this visit.There is no height or weight on file to calculate BMI.  General Appearance: Casual and Fairly Groomed  Eye Contact:  Good  Speech:  Clear and Coherent and Normal Rate  Volume:  Normal  Mood:   "good"  Affect: Constricted  Thought Content: occasional and fleeting AH  Suicidal Thoughts:  No  Homicidal Thoughts:  No  Thought Process:  Goal Directed and Linear   Orientation:  Full (Time, Place, and Person)    Memory:   Grossly intact  Judgment:  Good  Insight:  Good  Concentration:  Concentration: Good  Recall:  NA  Fund of Knowledge: Good  Language: Good  Psychomotor Activity: Normal  Akathisia:  No  AIMS (if indicated): see above  Assets:  Communication Skills Desire for Improvement Housing Leisure Time Physical Health Transportation  ADL's:  Intact  Cognition: WNL  Sleep:  Good   Physical Exam Constitutional:      Appearance: the patient is not toxic-appearing.  Pulmonary:     Effort: Pulmonary effort is normal.  Neurological:     General: No tremor    Mental Status: the patient is alert and oriented to person, place, and time.   Review of Systems  Respiratory:  Negative for shortness of breath.   Cardiovascular:  Negative for chest pain.  Gastrointestinal:  Negative for abdominal pain, constipation, diarrhea, nausea and vomiting.  Neurological:  Negative for headaches.    Metabolic Disorder Labs: Lab Results  Component Value Date   HGBA1C 5.2 04/10/2023   MPG 100 04/12/2016   No results found for: "PROLACTIN" Lab Results  Component Value Date   CHOL 114 04/10/2023   TRIG 70 04/10/2023   HDL 37 (L) 04/10/2023   CHOLHDL 3.1 04/10/2023   VLDL 14 04/12/2016   LDLCALC 62 04/10/2023   LDLCALC 60 11/20/2022   Lab Results  Component Value Date   TSH 0.330 (L) 04/12/2016    Therapeutic Level Labs: No results found for: "LITHIUM" No results found for: "VALPROATE" No results found for: "CBMZ"  Screenings:  AIMS    Flowsheet Row Clinical Support from 10/06/2020 in [facility]  AIMS Total Score 0      GAD-7    Flowsheet Row Office Visit from 06/14/2023 in [facility] - A Dept Of [place]. [facility] Office Visit from 04/10/2023 in [facility] - A Dept Of Eligha Bridegroom. [facility] Office Visit from 01/02/2023 in [facility] - A Dept Of Eligha Bridegroom. [facility] Office  Visit from 11/20/2022 in [facility] - A Dept Of Eligha Bridegroom. [facility] Video Visit from 05/11/2022 in [facility]  Total GAD-7 Score 6 7 6 10 9       PHQ2-9    Flowsheet Row Office Visit from 10/21/2023 in [facility] - A Dept Of [employer]. [facility] Office Visit from 06/14/2023 in [facility] [place] - A Dept Of Eligha Bridegroom. [facility] Office Visit from 04/10/2023 in [facility] [place] - A Dept Of Eligha Bridegroom. [facility] Office Visit from 04/09/2023 in [facility] - A Dept Of [place]. [facility] Office Visit from 02/04/2023 in [facility] - A Dept Of . [facility]  PHQ-2 Total Score 0 1 5 0 1  PHQ-9 Total Score -- 5 6 -- --      Flowsheet Row ED from 05/13/2022 in [facility] Emergency Department at [facility] Office Visit from 02/01/2022 in [facility] Clinical Support from 02/20/2021 in [facility]  C-SSRS RISK CATEGORY No Risk No Risk No Risk       Collaboration of Care: none   I provided 30 minutes of face-to-face time during this encounter.  Carlyn Reichert, MD 12/12/2023, 1:22 PM

## 2023-12-13 ENCOUNTER — Ambulatory Visit: Payer: Commercial Managed Care - HMO | Admitting: Nurse Practitioner

## 2024-01-07 ENCOUNTER — Ambulatory Visit: Attending: Nurse Practitioner | Admitting: Nurse Practitioner

## 2024-01-07 ENCOUNTER — Encounter: Payer: Self-pay | Admitting: Nurse Practitioner

## 2024-01-07 VITALS — BP 131/75 | HR 65 | Resp 19 | Ht 69.0 in | Wt 161.4 lb

## 2024-01-07 DIAGNOSIS — Z23 Encounter for immunization: Secondary | ICD-10-CM

## 2024-01-07 DIAGNOSIS — G252 Other specified forms of tremor: Secondary | ICD-10-CM | POA: Diagnosis not present

## 2024-01-07 DIAGNOSIS — Z0100 Encounter for examination of eyes and vision without abnormal findings: Secondary | ICD-10-CM | POA: Diagnosis not present

## 2024-01-07 DIAGNOSIS — E871 Hypo-osmolality and hyponatremia: Secondary | ICD-10-CM

## 2024-01-07 NOTE — Progress Notes (Signed)
 Assessment & Plan:  Curtis Mcdonald was seen today for medical management of chronic issues.  Diagnoses and all orders for this visit:  Coarse tremors -     Ambulatory referral to Neurology  Hyponatremia -     CMP14+EGFR  Routine eye exam -     Ambulatory referral to Ophthalmology  Need for pneumococcal 20-valent conjugate vaccination -     Pneumococcal conjugate vaccine 20-valent    Patient has been counseled on age-appropriate routine health concerns for screening and prevention. These are reviewed and up-to-date. Referrals have been placed accordingly. Immunizations are up-to-date or declined.    Subjective:   Chief Complaint  Patient presents with   Medical Management of Chronic Issues    Curtis Mcdonald 68 y.o. male presents to office today fir follow up to coarse tremors which seems to have been worsening over the past several months. He was started on propranolol last year 02-2023 by psychiatry due to essential tremors at that time.  His tremors are currently interfering with his ADLs.   My observation of his tremors coincides with the previous notes on 02-21-2023: He has rhythmic movement of his right hand (with his left hand affected to a lesser degree).  The tremor is made worse with goal-directed motion, such as finger-nose testing.  He reports difficulty picking up glasses and drinking water. The patient has no difficulty writing his name or drawing a spiral.  There is no rigidity or bradykinesia noted.  Denies knowledge of family history of tremor. Does not drink etoh to know if tremor lessens with etoh use.  He is currently taking propranolol 20 mg BID with not much improvement.  He has a lengthy psychiatric history and is currently prescribed:  Trazodone Seroquel Buspar Currently followed by psychiatry.    Review of Systems  Constitutional:  Negative for fever, malaise/fatigue and weight loss.  HENT: Negative.  Negative for nosebleeds.   Eyes: Negative.  Negative for  blurred vision, double vision and photophobia.  Respiratory: Negative.  Negative for cough and shortness of breath.   Cardiovascular: Negative.  Negative for chest pain, palpitations and leg swelling.  Gastrointestinal: Negative.  Negative for heartburn, nausea and vomiting.  Musculoskeletal: Negative.  Negative for myalgias.  Neurological:  Positive for tremors. Negative for dizziness, focal weakness, seizures and headaches.  Psychiatric/Behavioral:  Negative for suicidal ideas.     Past Medical History:  Diagnosis Date   Anxiety    Chronic hepatitis C without hepatic coma (HCC) 02/04/2023   Paranoid schizophrenia (HCC)    Stroke (HCC) 2017   Vaccine counseling 08/01/2023    Past Surgical History:  Procedure Laterality Date   COLONOSCOPY  2013   WISDOM TOOTH EXTRACTION      Family History  Problem Relation Age of Onset   CAD Neg Hx    Diabetes Neg Hx    Colon cancer Neg Hx    Colon polyps Neg Hx    Esophageal cancer Neg Hx    Rectal cancer Neg Hx    Stomach cancer Neg Hx     Social History Reviewed with no changes to be made today.   Outpatient Medications Prior to Visit  Medication Sig Dispense Refill   busPIRone (BUSPAR) 10 MG tablet Take 1 tablet (10 mg total) by mouth 3 (three) times daily. 90 tablet 2   Elastic Bandages & Supports (LUMBAR BACK BRACE/SUPPORT PAD) MISC 1 each by Does not apply route daily. PLEASE FAX TO [phone] along with demographics and insurance information 1  each 0   propranolol (INDERAL) 20 MG tablet Take 1 tablet (20 mg total) by mouth 2 (two) times daily. 60 tablet 2   Sofosbuvir-Velpatasvir (EPCLUSA) 400-100 MG TABS Take 1 tablet by mouth daily. 28 tablet 2   trazodone (DESYREL) 300 MG tablet Take 1 tablet (300 mg total) by mouth at bedtime. 30 tablet 2   Vitamin D, Ergocalciferol, (DRISDOL) 1.25 MG (50000 UNIT) CAPS capsule Take 1 capsule (50,000 Units total) by mouth every 7 (seven) days. 12 capsule 2   lurasidone (LATUDA) 40 MG TABS  tablet Take 1 tablet (40 mg total) by mouth daily with breakfast. 30 tablet 2   meloxicam (MOBIC) 7.5 MG tablet Take 1-2 tablets (7.5-15 mg total) by mouth daily. 60 tablet 0   valbenazine (INGREZZA) 40 MG capsule Take 1 capsule (40 mg total) by mouth daily. 30 capsule 2   No facility-administered medications prior to visit.    No Known Allergies     Objective:    BP 131/75 (BP Location: Left Arm, Patient Position: Sitting)   Pulse 65   Resp 19   Ht 5\' 9"  (1.753 m)   Wt 161 lb 6.4 oz (73.2 kg)   SpO2 100%   BMI 23.83 kg/m  Wt Readings from Last 3 Encounters:  01/07/24 161 lb 6.4 oz (73.2 kg)  10/21/23 186 lb (84.4 kg)  08/01/23 180 lb (81.6 kg)    Physical Exam Vitals and nursing note reviewed.  Constitutional:      Appearance: He is well-developed.  HENT:     Head: Normocephalic and atraumatic.  Cardiovascular:     Rate and Rhythm: Normal rate and regular rhythm.     Heart sounds: Normal heart sounds. No murmur heard.    No friction rub. No gallop.  Pulmonary:     Effort: Pulmonary effort is normal. No tachypnea or respiratory distress.     Breath sounds: Normal breath sounds. No decreased breath sounds, wheezing, rhonchi or rales.  Chest:     Chest wall: No tenderness.  Abdominal:     General: Bowel sounds are normal.     Palpations: Abdomen is soft.  Musculoskeletal:        General: Normal range of motion.     Cervical back: Normal range of motion.  Skin:    General: Skin is warm and dry.  Neurological:     Mental Status: He is alert and oriented to person, place, and time.     Motor: Tremor present.     Coordination: Coordination normal.  Psychiatric:        Behavior: Behavior normal. Behavior is cooperative.          Patient has been counseled extensively about nutrition and exercise as well as the importance of adherence with medications and regular follow-up. The patient was given clear instructions to go to ER or return to medical center if  symptoms don't improve, worsen or new problems develop. The patient verbalized understanding.   Follow-up: Return in about 6 months (around 07/08/2024) for physical.   Collins Dean, FNP-BC [facility] [place], [place] [phone]   01/21/2024, 10:34 PM

## 2024-01-08 ENCOUNTER — Encounter: Payer: Self-pay | Admitting: Nurse Practitioner

## 2024-01-08 LAB — CMP14+EGFR
ALT: 15 IU/L (ref 0–44)
AST: 36 IU/L (ref 0–40)
Albumin: 4 g/dL (ref 3.9–4.9)
Alkaline Phosphatase: 86 IU/L (ref 44–121)
BUN/Creatinine Ratio: 6 — ABNORMAL LOW (ref 10–24)
BUN: 6 mg/dL — ABNORMAL LOW (ref 8–27)
Bilirubin Total: 1.1 mg/dL (ref 0.0–1.2)
CO2: 24 mmol/L (ref 20–29)
Calcium: 9.3 mg/dL (ref 8.6–10.2)
Chloride: 97 mmol/L (ref 96–106)
Creatinine, Ser: 1.01 mg/dL (ref 0.76–1.27)
Globulin, Total: 3.3 g/dL (ref 1.5–4.5)
Glucose: 81 mg/dL (ref 70–99)
Potassium: 4.1 mmol/L (ref 3.5–5.2)
Sodium: 135 mmol/L (ref 134–144)
Total Protein: 7.3 g/dL (ref 6.0–8.5)
eGFR: 83 mL/min/{1.73_m2} (ref >59)

## 2024-01-09 ENCOUNTER — Ambulatory Visit (INDEPENDENT_AMBULATORY_CARE_PROVIDER_SITE_OTHER): Admitting: Student

## 2024-01-09 DIAGNOSIS — G2119 Other drug induced secondary parkinsonism: Secondary | ICD-10-CM | POA: Diagnosis not present

## 2024-01-09 DIAGNOSIS — G250 Essential tremor: Secondary | ICD-10-CM

## 2024-01-09 DIAGNOSIS — G2401 Drug induced subacute dyskinesia: Secondary | ICD-10-CM

## 2024-01-09 DIAGNOSIS — F200 Paranoid schizophrenia: Secondary | ICD-10-CM

## 2024-01-09 DIAGNOSIS — F411 Generalized anxiety disorder: Secondary | ICD-10-CM

## 2024-01-09 MED ORDER — QUETIAPINE FUMARATE 50 MG PO TABS: 50.0000 mg | ORAL_TABLET | Freq: Every day | ORAL | 2 refills | Status: DC

## 2024-01-10 NOTE — Progress Notes (Signed)
 [facility] MD Outpatient Progress Note  01/09/2024 Curtis Mcdonald  MRN:  [id number]  Assessment:  Curtis Mcdonald presents for follow-up evaluation.  On evaluation today, the patient's extrapyramidal symptoms (which seemed most consistent with drug-induced parkinsonism) have improved significantly.  The patient's gait is quicker and his right hand tremor is reduced in severity.  The patient does still exhibit notable cogwheeling.  Though some symptoms persist, the patient does not appear at all bothered by this (nor was he bothered when his symptoms are more severe).  It seems likely that decreasing Ingrezza contributed to the patient's improvement.  Plan to discontinue the medication.  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  The patient will follow-up later this month.  Should drug-induced parkinsonism persist, can consider amantadine.  Identifying Information: Curtis Mcdonald is a 68 y.o. male with a history of schizophrenia and anxiety who is an established patient with [facility] for treatment of schizophrenia.  Plan:  # Schizophrenia  Anxiety  tardive dyskinesia Interventions: - Discontinue Ingrezza (appears to have caused drug-induced parkinsonism) -- Discontinue Latuda 40 mg daily - Start Seroquel 50 mg nightly -- Continue Buspar 10 mg TID  #Long term use of antipsychotic medication - EKG July 2024 with NSR QTc of 474 (previous Qtc of 480), message sent to primary care physician to repeat this at his upcoming visit this month - Lipids unremarkable in July 2024 - A1c 5.2 in July 2024 - AIMS of 0 on 11/01/2022, 01/24/2023; AIMS of 3 on 07/04/2023 and 08/16/2023 for tongue movements ; AIMS 0 on 10/11/2023 ; parkinsonism noted on 12/10/2023 ; parkinsonism significantly improved on 01/09/2024.  # Sleep Past medication trials: unknown Status of problem: stable Interventions: -- Continue  trazodone 300 mg nightly  # Tremor, most likely essential tremor Past medication trials: none Interventions:  -- Continue propranolol 20 mg twice daily.  Patient was given contact information for behavioral health clinic and was instructed to call 911 for emergencies.   Subjective:  Chief Complaint:  Chief Complaint  Patient presents with   Follow-up    Interval History:  Today the patient reports he has been doing well.  He reports going to church regularly and finding this enjoyable.  He reports mild and occasional auditory hallucinations that do not bother him.  They usually say "be on the look out".  The patient denies experiencing paranoia.   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 Tardive dyskinesia  G24.01     3. Paranoid schizophrenia (HCC)  F20.0 QUEtiapine (SEROQUEL) 50 MG tablet    4. Essential tremor  G25.0     5. Generalized anxiety disorder  F41.1          Past Psychiatric History:  Diagnoses: schizophrenia, anxiety, cocaine abuse  Medication trials: unknown Hospitalizations: denies Suicide attempts: denies Hx of abuse: denies Substance use:   -- Denies use of cannabis, cocaine (last used in 2004), or illicit drug use  -- Etoh: denies  -- Tobacco: denies  -- Caffeine: 1 cup once a week  Past Medical History:  Past Medical History:  Diagnosis Date   Anxiety    Chronic hepatitis C without hepatic coma (HCC) 02/04/2023   Paranoid schizophrenia (HCC)    Stroke (HCC) 2017   Vaccine counseling 08/01/2023    Past Surgical History:  Procedure Laterality Date  COLONOSCOPY  2013   WISDOM TOOTH EXTRACTION      Family Psychiatric History: denies  Family History:  Family History  Problem Relation Age of Onset   CAD Neg Hx    Diabetes Neg Hx    Colon cancer Neg Hx    Colon polyps Neg Hx    Esophageal cancer Neg Hx    Rectal cancer Neg Hx    Stomach cancer Neg Hx     Social History:  Social  History   Socioeconomic History   Marital status: Single    Spouse name: Not on file   Number of children: Not on file   Years of education: Not on file   Highest education level: GED or equivalent  Occupational History   Occupation: Atm assembly    Comment: [employer]  Tobacco Use   Smoking status: Never   Smokeless tobacco: Never  Vaping Use   Vaping status: Never Used  Substance and Sexual Activity   Alcohol use: No    Comment: fomer    Drug use: No   Sexual activity: Not Currently    Birth control/protection: Abstinence  Other Topics Concern   Not on file  Social History Narrative   Not on file   Social Drivers of Health   Financial Resource Strain: Low Risk  (12/29/2022)   Overall Financial Resource Strain (CARDIA)    Difficulty of Paying Living Expenses: Not hard at all  Food Insecurity: No Food Insecurity (12/29/2022)   Hunger Vital Sign    Worried About Running Out of Food in the Last Year: Never true    Ran Out of Food in the Last Year: Never true  Transportation Needs: Unknown (12/29/2022)   PRAPARE - Administrator, Civil Service (Medical): Not on file    Lack of Transportation (Non-Medical): No  Physical Activity: Unknown (12/29/2022)   Exercise Vital Sign    Days of Exercise per Week: Patient declined    Minutes of Exercise per Session: Not on file  Stress: Stress Concern Present (12/29/2022)   [employer] of Occupational Health - Occupational Stress Questionnaire    Feeling of Stress : To some extent  Social Connections: Moderately Isolated (12/29/2022)   Social Connection and Isolation Panel [NHANES]    Frequency of Communication with Friends and Family: Never    Frequency of Social Gatherings with Friends and Family: Never    Attends Religious Services: More than 4 times per year    Active Member of [employer] or Organizations: Yes    Attends Engineer, structural: More than 4 times per year    Marital Status: Never married     Allergies: No Known Allergies  Current Medications: Current Outpatient Medications  Medication Sig Dispense Refill   QUEtiapine (SEROQUEL) 50 MG tablet Take 1 tablet (50 mg total) by mouth at bedtime. 30 tablet 2   busPIRone (BUSPAR) 10 MG tablet Take 1 tablet (10 mg total) by mouth 3 (three) times daily. 90 tablet 2   Elastic Bandages & Supports (LUMBAR BACK BRACE/SUPPORT PAD) MISC 1 each by Does not apply route daily. PLEASE FAX TO [phone] along with demographics and insurance information 1 each 0   lurasidone (LATUDA) 40 MG TABS tablet Take 1 tablet (40 mg total) by mouth daily with breakfast. 30 tablet 2   meloxicam (MOBIC) 7.5 MG tablet Take 1-2 tablets (7.5-15 mg total) by mouth daily. 60 tablet 0   propranolol (INDERAL) 20 MG tablet Take 1 tablet (20 mg total) by  mouth 2 (two) times daily. 60 tablet 2   Sofosbuvir-Velpatasvir (EPCLUSA) 400-100 MG TABS Take 1 tablet by mouth daily. 28 tablet 2   trazodone (DESYREL) 300 MG tablet Take 1 tablet (300 mg total) by mouth at bedtime. 30 tablet 2   Vitamin D, Ergocalciferol, (DRISDOL) 1.25 MG (50000 UNIT) CAPS capsule Take 1 capsule (50,000 Units total) by mouth every 7 (seven) days. 12 capsule 2   No current facility-administered medications for this visit.     Objective:  Psychiatric Specialty Exam: There were no vitals taken for this visit.There is no height or weight on file to calculate BMI.  General Appearance: Casual and Fairly Groomed  Eye Contact:  Good  Speech:  Clear and Coherent and Normal Rate  Volume:  Normal  Mood:   "good"  Affect: Constricted  Thought Content: occasional and fleeting AH  Suicidal Thoughts:  No  Homicidal Thoughts:  No  Thought Process:  Goal Directed and Linear  Orientation:  Full (Time, Place, and Person)    Memory:   Grossly intact  Judgment:  Good  Insight:  Good  Concentration:  Concentration: Good  Recall:  NA  Fund of Knowledge: Good  Language: Good  Psychomotor Activity:  Normal  Akathisia:  No  AIMS (if indicated): see above  Assets:  Communication Skills Desire for Improvement Housing Leisure Time Physical Health Transportation  ADL's:  Intact  Cognition: WNL  Sleep:  Good   Physical Exam Constitutional:      Appearance: the patient is not toxic-appearing.  Pulmonary:     Effort: Pulmonary effort is normal.  Neurological:      Mental Status: the patient is alert and oriented to person, place, and time.   Review of Systems  Respiratory:  Negative for shortness of breath.   Cardiovascular:  Negative for chest pain.  Gastrointestinal:  Negative for abdominal pain, constipation, diarrhea, nausea and vomiting.  Neurological:  Negative for headaches.    Metabolic Disorder Labs: Lab Results  Component Value Date   HGBA1C 5.2 04/10/2023   MPG 100 04/12/2016   No results found for: "PROLACTIN" Lab Results  Component Value Date   CHOL 114 04/10/2023   TRIG 70 04/10/2023   HDL 37 (L) 04/10/2023   CHOLHDL 3.1 04/10/2023   VLDL 14 04/12/2016   LDLCALC 62 04/10/2023   LDLCALC 60 11/20/2022   Lab Results  Component Value Date   TSH 0.330 (L) 04/12/2016    Therapeutic Level Labs: No results found for: "LITHIUM" No results found for: "VALPROATE" No results found for: "CBMZ"  Screenings:  AIMS    Flowsheet Row Clinical Support from 10/06/2020 in [facility]  AIMS Total Score 0      GAD-7    Flowsheet Row Office Visit from 06/14/2023 in [facility] [place] - A Dept Of [place]. [facility] Office Visit from 04/10/2023 in [facility] [place] - A Dept Of Eligha Bridegroom. [facility] Office Visit from 01/02/2023 in [facility] [place] - A Dept Of [place]igha Bridegroom. [facility] Office Visit from 11/20/2022 in [facility] [place] - A Dept Of [place] Bridegroom. [facility] Video Visit from 05/11/2022 in [facility]  Total GAD-7  Score 6 7 6 10 9       PHQ2-9    Flowsheet Row Office Visit from 01/07/2024 in [facility] [place] - A Dept Of . [facility] Office  Visit from 10/21/2023 in [facility] Infect [facility] - A Dept Of [place]. [facility] Office Visit from 06/14/2023 in [facility] [place] - A Dept Of [place] Bridegroom. [facility] Office Visit from 04/10/2023 in [facility] [place] - A Dept Of [place]igha Bridegroom. [facility] Office Visit from 04/09/2023 in [facility] Infect Dis - A Dept Of [place]. [facility]  PHQ-2 Total Score 0 0 1 5 0  PHQ-9 Total Score -- -- 5 6 --      Flowsheet Row ED from 05/13/2022 in [facility] Emergency Department at [facility] Office Visit from 02/01/2022 in [facility] Clinical Support from 02/20/2021 in [facility]  C-SSRS RISK CATEGORY No Risk No Risk No Risk       Collaboration of Care: none   I provided 30 minutes of face-to-face time during this encounter.  Carlyn Reichert, MD 01/13/2024, 3:46 AM

## 2024-01-13 DIAGNOSIS — G2119 Other drug induced secondary parkinsonism: Secondary | ICD-10-CM | POA: Insufficient documentation

## 2024-01-20 ENCOUNTER — Other Ambulatory Visit: Payer: Self-pay | Admitting: Nurse Practitioner

## 2024-01-20 DIAGNOSIS — M545 Low back pain, unspecified: Secondary | ICD-10-CM

## 2024-01-21 MED ORDER — MELOXICAM 7.5 MG PO TABS: 7.5000 mg | ORAL_TABLET | Freq: Every day | ORAL | 0 refills | Status: DC

## 2024-01-30 ENCOUNTER — Ambulatory Visit (INDEPENDENT_AMBULATORY_CARE_PROVIDER_SITE_OTHER): Admitting: Student

## 2024-01-30 DIAGNOSIS — Z79899 Other long term (current) drug therapy: Secondary | ICD-10-CM | POA: Diagnosis not present

## 2024-01-30 DIAGNOSIS — F200 Paranoid schizophrenia: Secondary | ICD-10-CM | POA: Diagnosis not present

## 2024-01-30 MED ORDER — QUETIAPINE FUMARATE 100 MG PO TABS: 100.0000 mg | ORAL_TABLET | Freq: Every day | ORAL | 2 refills | Status: DC

## 2024-02-03 NOTE — Progress Notes (Signed)
 [facility] MD Outpatient Progress Note  01/30/2024 Curtis Mcdonald  MRN:  [id number]  Assessment:  Curtis Mcdonald presents for follow-up evaluation.  The patient exhibits some improvement, though symptoms of parkinsonism are similar to the last appointment.  No recurrence of TD with being off of Ingrezza .  He does report that he has not discontinued Latuda  yet.  We discussed that he should discontinue the medication.  We discussed increasing the dose of Seroquel .  Can consider amantadine for further treatment of parkinsonism at next appointment.  Identifying Information: Curtis Mcdonald is a 68 y.o. male with a history of schizophrenia and anxiety who is an established patient with [facility] for treatment of schizophrenia.  Plan:  # Schizophrenia  Anxiety  tardive dyskinesia Interventions: - Ingrezza  has been discontinued -- Discontinue Latuda  40 mg daily - Increase Seroquel  from 50 mg nightly to 100 mg nightly -- Continue Buspar  10 mg TID  #Long term use of antipsychotic medication - EKG July 2024 with NSR QTc of 474 (previous Qtc of 480), message sent to primary care physician to repeat this - Lipids unremarkable in July 2024 - A1c 5.2 in July 2024 - AIMS of 0 on 11/01/2022, 01/24/2023; AIMS of 3 on 07/04/2023 and 08/16/2023 for tongue movements ; AIMS 0 on 10/11/2023 ; parkinsonism noted on 12/10/2023 ; parkinsonism significantly improved on 01/09/2024.  02/03/2024 exam showing parkinsonism similar to previous visit.  No tardive tongue movements.  # Sleep Past medication trials: unknown Status of problem: stable Interventions: -- Continue trazodone  300 mg nightly  # Tremor, most likely essential tremor Past medication trials: none Interventions:  -- Continue propranolol  20 mg twice daily.  Patient was given contact information for behavioral health clinic and was instructed to call 911 for emergencies.   Subjective:  Chief Complaint:  Chief Complaint  Patient presents  with   Follow-up    Interval History:  Today the patient reports he is doing "okay".  The patient does not smell as malodorous as previous visits.  He speaks more spontaneously, which is an improvement.  Physical exam notable for large amplitude resting tremor in his right hand.  Left arm demonstrates some cogwheeling.  Gait examination shows minimal arm swing.  The patient reports infrequent auditory hallucinations that are benign in nature.  He denies experiencing paranoia.  He denies experiencing suicidal thoughts.   Visit Diagnosis:    ICD-10-CM   1. Paranoid schizophrenia (HCC)  F20.0 QUEtiapine  (SEROQUEL ) 100 MG tablet    2. Long term current use of antipsychotic medication  Z79.899          Past Psychiatric History:  Diagnoses: schizophrenia, anxiety, cocaine abuse  Medication trials: unknown Hospitalizations: denies Suicide attempts: denies Hx of abuse: denies Substance use:   -- Denies use of cannabis, cocaine (last used in 2004), or illicit drug use  -- Etoh: denies  -- Tobacco: denies  -- Caffeine: 1 cup once a week  Past Medical History:  Past Medical History:  Diagnosis Date   Anxiety    Chronic hepatitis C without hepatic coma (HCC) 02/04/2023   Paranoid schizophrenia (HCC)    Stroke (HCC) 2017   Vaccine counseling 08/01/2023    Past Surgical History:  Procedure Laterality Date   COLONOSCOPY  2013   WISDOM TOOTH EXTRACTION      Family Psychiatric History: denies  Family History:  Family History  Problem Relation Age of Onset   CAD Neg Hx    Diabetes Neg Hx  Colon cancer Neg Hx    Colon polyps Neg Hx    Esophageal cancer Neg Hx    Rectal cancer Neg Hx    Stomach cancer Neg Hx     Social History:  Social History   Socioeconomic History   Marital status: Single    Spouse name: Not on file   Number of children: Not on file   Years of education: Not on file   Highest education level: GED or equivalent  Occupational History    Occupation: Atm assembly    Comment: [employer]  Tobacco Use   Smoking status: Never   Smokeless tobacco: Never  Vaping Use   Vaping status: Never Used  Substance and Sexual Activity   Alcohol use: No    Comment: fomer    Drug use: No   Sexual activity: Not Currently    Birth control/protection: Abstinence  Other Topics Concern   Not on file  Social History Narrative   Not on file   Social Drivers of Health   Financial Resource Strain: Low Risk  (12/29/2022)   Overall Financial Resource Strain (CARDIA)    Difficulty of Paying Living Expenses: Not hard at all  Food Insecurity: No Food Insecurity (12/29/2022)   Hunger Vital Sign    Worried About Running Out of Food in the Last Year: Never true    Ran Out of Food in the Last Year: Never true  Transportation Needs: Unknown (12/29/2022)   PRAPARE - Administrator, Civil Service (Medical): Not on file    Lack of Transportation (Non-Medical): No  Physical Activity: Unknown (12/29/2022)   Exercise Vital Sign    Days of Exercise per Week: Patient declined    Minutes of Exercise per Session: Not on file  Stress: Stress Concern Present (12/29/2022)   [employer] of Occupational Health - Occupational Stress Questionnaire    Feeling of Stress : To some extent  Social Connections: Moderately Isolated (12/29/2022)   Social Connection and Isolation Panel [NHANES]    Frequency of Communication with Friends and Family: Never    Frequency of Social Gatherings with Friends and Family: Never    Attends Religious Services: More than 4 times per year    Active Member of [employer] or Organizations: Yes    Attends Engineer, structural: More than 4 times per year    Marital Status: Never married    Allergies: No Known Allergies  Current Medications: Current Outpatient Medications  Medication Sig Dispense Refill   QUEtiapine  (SEROQUEL ) 100 MG tablet Take 1 tablet (100 mg total) by mouth at bedtime. 30 tablet 2   busPIRone   (BUSPAR ) 10 MG tablet Take 1 tablet (10 mg total) by mouth 3 (three) times daily. 90 tablet 2   Elastic Bandages & Supports (LUMBAR BACK BRACE/SUPPORT PAD) MISC 1 each by Does not apply route daily. PLEASE FAX TO [phone] along with demographics and insurance information 1 each 0   meloxicam  (MOBIC ) 7.5 MG tablet Take 1-2 tablets (7.5-15 mg total) by mouth daily. 60 tablet 0   propranolol  (INDERAL ) 20 MG tablet Take 1 tablet (20 mg total) by mouth 2 (two) times daily. 60 tablet 2   Sofosbuvir -Velpatasvir  (EPCLUSA ) 400-100 MG TABS Take 1 tablet by mouth daily. 28 tablet 2   trazodone  (DESYREL ) 300 MG tablet Take 1 tablet (300 mg total) by mouth at bedtime. 30 tablet 2   Vitamin D , Ergocalciferol , (DRISDOL ) 1.25 MG (50000 UNIT) CAPS capsule Take 1 capsule (50,000 Units total) by mouth every  7 (seven) days. 12 capsule 2   No current facility-administered medications for this visit.     Objective:  Psychiatric Specialty Exam: There were no vitals taken for this visit.There is no height or weight on file to calculate BMI.  General Appearance: Casual and Fairly Groomed  Eye Contact:  Good  Speech:  Clear and Coherent and Normal Rate  Volume:  Normal  Mood:   "good"  Affect: Constricted  Thought Content: occasional and fleeting AH  Suicidal Thoughts:  No  Homicidal Thoughts:  No  Thought Process:  Goal Directed and Linear  Orientation:  Full (Time, Place, and Person)    Memory:   Grossly intact  Judgment:  Good  Insight:  Good  Concentration:  Concentration: Good  Recall:  NA  Fund of Knowledge: Good  Language: Good  Psychomotor Activity: Normal  Akathisia:  No  AIMS (if indicated): see above  Assets:  Communication Skills Desire for Improvement Housing Leisure Time Physical Health Transportation  ADL's:  Intact  Cognition: WNL  Sleep:  Good   Physical Exam Constitutional:      Appearance: the patient is not toxic-appearing.  Pulmonary:     Effort: Pulmonary effort is  normal.  Neurological:      Mental Status: the patient is alert and oriented to person, place, and time.   Review of Systems  Respiratory:  Negative for shortness of breath.   Cardiovascular:  Negative for chest pain.  Gastrointestinal:  Negative for abdominal pain, constipation, diarrhea, nausea and vomiting.  Neurological:  Negative for headaches.    Metabolic Disorder Labs: Lab Results  Component Value Date   HGBA1C 5.2 04/10/2023   MPG 100 04/12/2016   No results found for: "PROLACTIN" Lab Results  Component Value Date   CHOL 114 04/10/2023   TRIG 70 04/10/2023   HDL 37 (L) 04/10/2023   CHOLHDL 3.1 04/10/2023   VLDL 14 04/12/2016   LDLCALC 62 04/10/2023   LDLCALC 60 11/20/2022   Lab Results  Component Value Date   TSH 0.330 (L) 04/12/2016    Therapeutic Level Labs: No results found for: "LITHIUM" No results found for: "VALPROATE" No results found for: "CBMZ"  Screenings:  AIMS    Flowsheet Row Clinical Support from 10/06/2020 in [facility]  AIMS Total Score 0      GAD-7    Flowsheet Row Office Visit from 06/14/2023 in [facility] [place] - A Dept Of [place]. [facility] Office Visit from 04/10/2023 in [facility] - A Dept Of [place] Fragmin. [facility] Office Visit from 01/02/2023 in [facility] - A Dept Of [place]. [facility] Office Visit from 11/20/2022 in [facility] - A Dept Of [place] Frag[place]. [facility] Video Visit from 05/11/2022 in [facility]  Total GAD-7 Score 6 7 6 10 9       PHQ2-9    Flowsheet Row Office Visit from 01/07/2024 in [facility] [place] - A Dept Of [place]. [facility] Office Visit from 10/21/2023 in [facility] - A Dept Of [place]. [facility] Office Visit from 06/14/2023 in [facility] [place] - A Dept Of Tom[place] Fragmin. [facility] Office Visit from 04/10/2023 in [facility] - A Dept Of Tommas Fragmin. [facility] Office Visit from 04/09/2023 in [facility]  Ctr Infect Dis - A Dept Of [place]. [facility]  PHQ-2 Total Score 0 0 1 5 0  PHQ-9 Total Score -- -- 5 6 --      Flowsheet Row ED from 05/13/2022 in [facility] Emergency Department at [facility] Office Visit from 02/01/2022 in [facility] Clinical Support from 02/20/2021 in [facility]  C-SSRS RISK CATEGORY No Risk No Risk No Risk       Collaboration of Care: none   I provided 30 minutes of face-to-face time during this encounter.  Marilou Showman, MD 02/03/2024, 2:01 PM

## 2024-02-14 ENCOUNTER — Other Ambulatory Visit: Payer: Self-pay | Admitting: Nurse Practitioner

## 2024-02-14 DIAGNOSIS — G8929 Other chronic pain: Secondary | ICD-10-CM

## 2024-02-17 ENCOUNTER — Telehealth (HOSPITAL_COMMUNITY): Payer: Self-pay

## 2024-02-18 NOTE — Telephone Encounter (Signed)
 Medication management - Called patient back to inform our providers were not the ones writing Meloxicam  for patient but that Dr. Newlin has sent in a new order on 02/14/24 + 2 refills to his [employer]. Pt will contact pharmacy to fill order.

## 2024-03-05 ENCOUNTER — Ambulatory Visit (INDEPENDENT_AMBULATORY_CARE_PROVIDER_SITE_OTHER): Payer: Medicare (Managed Care) | Admitting: Student

## 2024-03-05 DIAGNOSIS — Z79899 Other long term (current) drug therapy: Secondary | ICD-10-CM | POA: Diagnosis not present

## 2024-03-05 DIAGNOSIS — F200 Paranoid schizophrenia: Secondary | ICD-10-CM | POA: Diagnosis not present

## 2024-03-09 NOTE — Progress Notes (Cosign Needed)
 [facility] MD Outpatient Progress Note  03/05/2024 Curtis Mcdonald  MRN:  [id number]  Assessment:  Curtis Mcdonald presents for follow-up evaluation.  The patient appears improved.  His speech is more spontaneous and he smiles at times.  His thought process is logical.  Psychotic symptoms remain minimal.  Regarding physical symptoms, the patient's gait has quickened, mild right upper extremity cogwheeling remains.  Significant high amplitude right hand tremor continues.  The patient will see neurology in August.  We will continue to consider amantadine, though it may be prudent to wait until neurological evaluation.  Patient reports moderate difficulty with picking up cups and using her.  The patient will follow-up with me 7/11.  Call  Identifying Information: Curtis Mcdonald is a 68 y.o. male with a history of schizophrenia and anxiety who is an established patient with [facility] for treatment of schizophrenia.  Plan:  # Schizophrenia  Anxiety  tardive dyskinesia Interventions: -Continue Seroquel  100 mg nightly -- Continue Buspar  10 mg TID -Patient reports constipation, encouraged the patient to use MiraLAX  #Long term use of antipsychotic medication - EKG July 2024 with NSR QTc of 474. I sent a message requesting that his primary care provider repeat this test.  He is scheduled to see this person in October. - Lipids unremarkable in July 2024 - A1c 5.2 in July 2024 - AIMS of 0 on 11/01/2022, 01/24/2023; AIMS of 3 on 07/04/2023 and 08/16/2023 for tongue movements ; AIMS 0 on 10/11/2023 ; parkinsonism noted on 12/10/2023 ; parkinsonism significantly improved on 01/09/2024.  02/03/2024 exam showing parkinsonism similar to previous visit.  No tardive tongue movements.  Exam on 5/29, no tardive movements noted, though his tongue protrudes from his mouth at various times.  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  He was started on Ingrezza  for TD in early 2025.  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  He improved significantly after discontinuation of the medication and switch from Latuda  to Seroquel .  # Sleep Past medication trials: unknown Status of problem: stable Interventions: -- Continue trazodone  300 mg nightly  # Tremor, most likely essential tremor Past medication trials: none Interventions:  -- Continue propranolol  20 mg twice daily.  Patient was given contact information for behavioral health clinic and was instructed to call 911 for emergencies.   Subjective:  Chief Complaint:  No chief complaint on file.   Interval History:  In addition to the above information, today the patient reports he is doing well.  He says that he has been calling his sister, which is certainly a new development for the patient.  He continues to report hearing a voice a few times a week saying "be on the look out".  He reports occasional paranoia when out in public, because he feels like he does not know what other people are up to.   Visit Diagnosis:  No diagnosis found.      Past Psychiatric History:  Diagnoses: schizophrenia, anxiety, cocaine abuse  Medication trials: unknown Hospitalizations: denies Suicide attempts: denies Hx of abuse: denies Substance use:   -- Denies use of cannabis, cocaine (last used in 2004), or illicit drug use  -- Etoh: denies  -- Tobacco: denies  -- Caffeine: 1 cup once a week  Past Medical History:  Past Medical History:  Diagnosis Date   Anxiety    Chronic hepatitis C without hepatic coma (HCC) 02/04/2023   Paranoid schizophrenia (HCC)    Stroke (  HCC) 2017   Vaccine counseling 08/01/2023    Past Surgical History:  Procedure Laterality Date   COLONOSCOPY  2013   WISDOM TOOTH EXTRACTION      Family Psychiatric History: denies  Family History:  Family History  Problem Relation Age of Onset   CAD Neg Hx    Diabetes Neg Hx    Colon cancer Neg Hx    Colon polyps  Neg Hx    Esophageal cancer Neg Hx    Rectal cancer Neg Hx    Stomach cancer Neg Hx     Social History:  Social History   Socioeconomic History   Marital status: Single    Spouse name: Not on file   Number of children: Not on file   Years of education: Not on file   Highest education level: GED or equivalent  Occupational History   Occupation: Atm assembly    Comment: [employer]  Tobacco Use   Smoking status: Never   Smokeless tobacco: Never  Vaping Use   Vaping status: Never Used  Substance and Sexual Activity   Alcohol use: No    Comment: fomer    Drug use: No   Sexual activity: Not Currently    Birth control/protection: Abstinence  Other Topics Concern   Not on file  Social History Narrative   Not on file   Social Drivers of Health   Financial Resource Strain: Low Risk  (12/29/2022)   Overall Financial Resource Strain (CARDIA)    Difficulty of Paying Living Expenses: Not hard at all  Food Insecurity: No Food Insecurity (12/29/2022)   Hunger Vital Sign    Worried About Running Out of Food in the Last Year: Never true    Ran Out of Food in the Last Year: Never true  Transportation Needs: Unknown (12/29/2022)   PRAPARE - Administrator, Civil Service (Medical): Not on file    Lack of Transportation (Non-Medical): No  Physical Activity: Unknown (12/29/2022)   Exercise Vital Sign    Days of Exercise per Week: Patient declined    Minutes of Exercise per Session: Not on file  Stress: Stress Concern Present (12/29/2022)   [employer] of Occupational Health - Occupational Stress Questionnaire    Feeling of Stress : To some extent  Social Connections: Moderately Isolated (12/29/2022)   Social Connection and Isolation Panel [NHANES]    Frequency of Communication with Friends and Family: Never    Frequency of Social Gatherings with Friends and Family: Never    Attends Religious Services: More than 4 times per year    Active Member of [employer] or Organizations:  Yes    Attends Engineer, structural: More than 4 times per year    Marital Status: Never married    Allergies: No Known Allergies  Current Medications: Current Outpatient Medications  Medication Sig Dispense Refill   busPIRone  (BUSPAR ) 10 MG tablet Take 1 tablet (10 mg total) by mouth 3 (three) times daily. 90 tablet 2   Elastic Bandages & Supports (LUMBAR BACK BRACE/SUPPORT PAD) MISC 1 each by Does not apply route daily. PLEASE FAX TO [phone] along with demographics and insurance information 1 each 0   meloxicam  (MOBIC ) 7.5 MG tablet TAKE 1 TO 2 TABLETS BY MOUTH DAILY 60 tablet 2   propranolol  (INDERAL ) 20 MG tablet Take 1 tablet (20 mg total) by mouth 2 (two) times daily. 60 tablet 2   QUEtiapine  (SEROQUEL ) 100 MG tablet Take 1 tablet (100 mg total) by mouth at  bedtime. 30 tablet 2   Sofosbuvir -Velpatasvir  (EPCLUSA ) 400-100 MG TABS Take 1 tablet by mouth daily. 28 tablet 2   trazodone  (DESYREL ) 300 MG tablet Take 1 tablet (300 mg total) by mouth at bedtime. 30 tablet 2   Vitamin D , Ergocalciferol , (DRISDOL ) 1.25 MG (50000 UNIT) CAPS capsule Take 1 capsule (50,000 Units total) by mouth every 7 (seven) days. 12 capsule 2   No current facility-administered medications for this visit.     Objective:  Psychiatric Specialty Exam: There were no vitals taken for this visit.There is no height or weight on file to calculate BMI.  General Appearance: Casual and Fairly Groomed  Eye Contact:  Good  Speech:  Clear and Coherent and Normal Rate  Volume:  Normal  Mood:  "good"  Affect: Constricted  Thought Content: occasional and fleeting AH  Suicidal Thoughts:  No  Homicidal Thoughts:  No  Thought Process:  Goal Directed and Linear  Orientation:  Full (Time, Place, and Person)    Memory:  Grossly intact  Judgment:  Good  Insight:  Good  Concentration:  Concentration: Good  Recall:  NA  Fund of Knowledge: Good  Language: Good  Psychomotor Activity: Normal  Akathisia:  No   AIMS (if indicated): see above  Assets:  Communication Skills Desire for Improvement Housing Leisure Time Physical Health Transportation  ADL's:  Intact  Cognition: WNL  Sleep:  Good   Physical Exam Constitutional:      Appearance: the patient is not toxic-appearing.  Pulmonary:     Effort: Pulmonary effort is normal.  Neurological:      Mental Status: the patient is alert and oriented to person, place, and time.   Review of Systems  Respiratory:  Negative for shortness of breath.   Cardiovascular:  Negative for chest pain.  Gastrointestinal:  Negative for abdominal pain, constipation, diarrhea, nausea and vomiting.  Neurological:  Negative for headaches.    Metabolic Disorder Labs: Lab Results  Component Value Date   HGBA1C 5.2 04/10/2023   MPG 100 04/12/2016   No results found for: "PROLACTIN" Lab Results  Component Value Date   CHOL 114 04/10/2023   TRIG 70 04/10/2023   HDL 37 (L) 04/10/2023   CHOLHDL 3.1 04/10/2023   VLDL 14 04/12/2016   LDLCALC 62 04/10/2023   LDLCALC 60 11/20/2022   Lab Results  Component Value Date   TSH 0.330 (L) 04/12/2016    Therapeutic Level Labs: No results found for: "LITHIUM" No results found for: "VALPROATE" No results found for: "CBMZ"  Screenings:  AIMS    Flowsheet Row Clinical Support from 10/06/2020 in [facility]  AIMS Total Score 0      GAD-7    Flowsheet Row Office Visit from 06/14/2023 in [place] [facility] [place] - A Dept Of [place]. [facility] Office Visit from 04/10/2023 in [facility] [place] - A Dept Of [place] Visit from 01/02/2023 in [facility] [place] - A Dept Of [place]. [facility] Office Visit from 11/20/2022 in [facility] [place] - A Dept Of [place]. [facility] Video Visit from 05/11/2022 in [facility]  Total GAD-7 Score 6 7 6 10 9        PHQ2-9    Flowsheet Row Office Visit from 01/07/2024 in [facility] [place] - A Dept Of [place]. [facility] Office Visit from 10/21/2023 in [facility]  Reg Ctr Infect Dis - A Dept Of [place]. [facility] Office Visit from 06/14/2023 in [facility] [place] - A Dept Of [place]. [facility] Office Visit from 04/10/2023 in [facility] [place] - A Dept Of [place]. [facility] Office Visit from 04/09/2023 in [facility] Reg Ctr Infect Dis - A Dept Of [place]. [facility]  PHQ-2 Total Score 0 0 1 5 0  PHQ-9 Total Score -- -- 5 6 --      Flowsheet Row ED from 05/13/2022 in [facility] Emergency Department at [facility] Office Visit from 02/01/2022 [facility] Clinical Support from 02/20/2021 in [facility]  C-SSRS RISK CATEGORY No Risk No Risk No Risk       Collaboration of Care: none   I provided 30 minutes of face-to-face time during this encounter.  Marilou Showman, MD 03/09/2024, 9:15 AM

## 2024-04-17 ENCOUNTER — Ambulatory Visit (INDEPENDENT_AMBULATORY_CARE_PROVIDER_SITE_OTHER): Payer: Medicare (Managed Care) | Admitting: Student

## 2024-04-17 DIAGNOSIS — G2119 Other drug induced secondary parkinsonism: Secondary | ICD-10-CM

## 2024-04-17 DIAGNOSIS — F200 Paranoid schizophrenia: Secondary | ICD-10-CM

## 2024-04-17 DIAGNOSIS — G250 Essential tremor: Secondary | ICD-10-CM

## 2024-04-17 DIAGNOSIS — Z79899 Other long term (current) drug therapy: Secondary | ICD-10-CM | POA: Diagnosis not present

## 2024-04-17 MED ORDER — QUETIAPINE FUMARATE 100 MG PO TABS: 100.0000 mg | ORAL_TABLET | Freq: Every day | ORAL | 2 refills | Status: DC

## 2024-04-17 MED ORDER — TRAZODONE HCL 300 MG PO TABS: 300.0000 mg | ORAL_TABLET | Freq: Every evening | ORAL | 2 refills | Status: DC

## 2024-04-17 MED ORDER — PROPRANOLOL HCL 20 MG PO TABS: 20.0000 mg | ORAL_TABLET | Freq: Two times a day (BID) | ORAL | 2 refills | Status: DC

## 2024-04-17 NOTE — Progress Notes (Signed)
 [facility] MD Outpatient Progress Note  03/05/2024 Curtis Mcdonald  MRN:  [id number]  Assessment:  Curtis Mcdonald presents for follow-up evaluation.  Slight improvement in physical appearance and physical exam from last appointment. See below for details. New case formulation: patient may have underlying predisposition to Parkinson Disease revealed by SGA/Ingrezza . Amantadine may be helpful. Neuro evaluation will be helpful.  F/u 2 weeks.   Identifying Information: Curtis Mcdonald is a 68 y.o. male with a history of schizophrenia and anxiety who is an established patient with [facility] for treatment of schizophrenia.  Plan:  # Schizophrenia  Anxiety  tardive dyskinesia Interventions: -Continue Seroquel  100 mg nightly -- Continue Buspar  10 mg TID -Patient reports constipation, encouraged the patient to use MiraLAX  #Long term use of antipsychotic medication - EKG July 2024 with NSR QTc of 474. I sent a message requesting that his primary care provider repeat this test.  He is scheduled to see this person in October. - Lipids unremarkable in July 2024 - A1c 5.2 in July 2024 - AIMS of 0 on 11/01/2022, 01/24/2023; AIMS of 3 on 07/04/2023 and 08/16/2023 for tongue movements ; AIMS 0 on 10/11/2023 ; parkinsonism noted on 12/10/2023 ; parkinsonism significantly improved on 01/09/2024.  02/03/2024 exam showing parkinsonism similar to previous visit.  No tardive tongue movements.  Exam on 5/29, no tardive movements noted, though his tongue protrudes from his mouth at various times.  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  He was started on Ingrezza  for TD in early 2025.  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  He improved significantly after discontinuation of the medication and switch from Latuda  to Seroquel .  # Sleep Past medication trials: unknown Status of problem: stable Interventions: -- Continue trazodone  300 mg nightly  #  Tremor, most likely essential tremor Past medication trials: none Interventions:  -- Continue propranolol  20 mg twice daily.  Patient was given contact information for behavioral health clinic and was instructed to call 911 for emergencies.   Subjective:  Chief Complaint:  Chief Complaint  Patient presents with   Follow-up    Interval History:  Physical exam notable for unkempt appearance and malodorous smell, but actually better than previous visits. Mild cogwheeling in L wrist, increases with activation. Gait is greatly improved - fairly quick today. Tremor remains intermittent, somewhat less severe than previous.   AH and paranoia unchanged from previous assessments - has been consistent for over 1 year. No SI/HI. Mood is good. Patient is happy with medications.      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 Long term current use of antipsychotic medication  Z79.899     3. Paranoid schizophrenia (HCC)  F20.0           Past Psychiatric History:  Diagnoses: schizophrenia, anxiety, cocaine abuse  Medication trials: unknown Hospitalizations: denies Suicide attempts: denies Hx of abuse: denies Substance use:   -- Denies use of cannabis, cocaine (last used in 2004), or illicit drug use  -- Etoh: denies  -- Tobacco: denies  -- Caffeine: 1 cup once a week  Past Medical History:  Past Medical History:  Diagnosis Date   Anxiety    Chronic hepatitis C without hepatic coma (HCC) 02/04/2023   Paranoid schizophrenia (HCC)    Stroke (HCC) 2017   Vaccine counseling 08/01/2023    Past Surgical History:  Procedure Laterality Date  COLONOSCOPY  2013   WISDOM TOOTH EXTRACTION      Family Psychiatric History: denies  Family History:  Family History  Problem Relation Age of Onset   CAD Neg Hx    Diabetes Neg Hx    Colon cancer Neg Hx    Colon polyps Neg Hx    Esophageal cancer Neg Hx    Rectal cancer Neg Hx    Stomach  cancer Neg Hx     Social History:  Social History   Socioeconomic History   Marital status: Single    Spouse name: Not on file   Number of children: Not on file   Years of education: Not on file   Highest education level: GED or equivalent  Occupational History   Occupation: Atm assembly    Comment: [employer]  Tobacco Use   Smoking status: Never   Smokeless tobacco: Never  Vaping Use   Vaping status: Never Used  Substance and Sexual Activity   Alcohol use: No    Comment: fomer    Drug use: No   Sexual activity: Not Currently    Birth control/protection: Abstinence  Other Topics Concern   Not on file  Social History Narrative   Not on file   Social Drivers of Health   Financial Resource Strain: Low Risk  (12/29/2022)   Overall Financial Resource Strain (CARDIA)    Difficulty of Paying Living Expenses: Not hard at all  Food Insecurity: No Food Insecurity (12/29/2022)   Hunger Vital Sign    Worried About Running Out of Food in the Last Year: Never true    Ran Out of Food in the Last Year: Never true  Transportation Needs: Unknown (12/29/2022)   PRAPARE - Administrator, Civil Service (Medical): Not on file    Lack of Transportation (Non-Medical): No  Physical Activity: Unknown (12/29/2022)   Exercise Vital Sign    Days of Exercise per Week: Patient declined    Minutes of Exercise per Session: Not on file  Stress: Stress Concern Present (12/29/2022)   [employer] of Occupational Health - Occupational Stress Questionnaire    Feeling of Stress : To some extent  Social Connections: Moderately Isolated (12/29/2022)   Social Connection and Isolation Panel    Frequency of Communication with Friends and Family: Never    Frequency of Social Gatherings with Friends and Family: Never    Attends Religious Services: More than 4 times per year    Active Member of [employer] or Organizations: Yes    Attends Engineer, structural: More than 4 times per year     Marital Status: Never married    Allergies: No Known Allergies  Current Medications: Current Outpatient Medications  Medication Sig Dispense Refill   Elastic Bandages & Supports (LUMBAR BACK BRACE/SUPPORT PAD) MISC 1 each by Does not apply route daily. PLEASE FAX TO [phone] along with demographics and insurance information 1 each 0   meloxicam  (MOBIC ) 7.5 MG tablet TAKE 1 TO 2 TABLETS BY MOUTH DAILY 60 tablet 2   propranolol  (INDERAL ) 20 MG tablet Take 1 tablet (20 mg total) by mouth 2 (two) times daily. 60 tablet 2   QUEtiapine  (SEROQUEL ) 100 MG tablet Take 1 tablet (100 mg total) by mouth at bedtime. 30 tablet 2   Sofosbuvir -Velpatasvir  (EPCLUSA ) 400-100 MG TABS Take 1 tablet by mouth daily. 28 tablet 2   trazodone  (DESYREL ) 300 MG tablet Take 1 tablet (300 mg total) by mouth at bedtime. 30 tablet 2  Vitamin D , Ergocalciferol , (DRISDOL ) 1.25 MG (50000 UNIT) CAPS capsule Take 1 capsule (50,000 Units total) by mouth every 7 (seven) days. 12 capsule 2   No current facility-administered medications for this visit.     Objective:  Psychiatric Specialty Exam: There were no vitals taken for this visit.There is no height or weight on file to calculate BMI.  General Appearance: Casual and Fairly Groomed  Eye Contact:  Good  Speech:  Clear and Coherent and Normal Rate  Volume:  Normal  Mood:  good  Affect: Constricted  Thought Content: occasional and fleeting AH  Suicidal Thoughts:  No  Homicidal Thoughts:  No  Thought Process:  Goal Directed and Linear  Orientation:  Full (Time, Place, and Person)    Memory:  Grossly intact  Judgment:  Good  Insight:  Good  Concentration:  Concentration: Good  Recall:  NA  Fund of Knowledge: Good  Language: Good  Psychomotor Activity: Normal  Akathisia:  No  AIMS (if indicated): see above  Assets:  Communication Skills Desire for Improvement Housing Leisure Time Physical Health Transportation  ADL's:  Intact  Cognition: WNL   Sleep:  Good   Physical Exam Constitutional:      Appearance: the patient is not toxic-appearing.  Pulmonary:     Effort: Pulmonary effort is normal.  Neurological:      Mental Status: the patient is alert and oriented to person, place, and time.   Review of Systems  Respiratory:  Negative for shortness of breath.   Cardiovascular:  Negative for chest pain.  Gastrointestinal:  Negative for abdominal pain, constipation, diarrhea, nausea and vomiting.  Neurological:  Negative for headaches.    Metabolic Disorder Labs: Lab Results  Component Value Date   HGBA1C 5.2 04/10/2023   MPG 100 04/12/2016   No results found for: PROLACTIN Lab Results  Component Value Date   CHOL 114 04/10/2023   TRIG 70 04/10/2023   HDL 37 (L) 04/10/2023   CHOLHDL 3.1 04/10/2023   VLDL 14 04/12/2016   LDLCALC 62 04/10/2023   LDLCALC 60 11/20/2022   Lab Results  Component Value Date   TSH 0.330 (L) 04/12/2016    Therapeutic Level Labs: No results found for: LITHIUM No results found for: VALPROATE No results found for: CBMZ  Screenings:  AIMS    Flowsheet Row Clinical Support from 10/06/2020 in [facility]  AIMS Total Score 0   GAD-7    Flowsheet Row Office Visit from 06/14/2023 in [place] [facility] [place] - A Dept Of [place]. [facility] Office Visit from 04/10/2023 in [facility] [place] - A Dept Of [place]. [facility] Office Visit from 01/02/2023 in [facility] - A Dept Of [place]. [facility] Office Visit from 11/20/2022 in [facility] [place] - A Dept Of [place]. [facility] Video Visit from 05/11/2022 in [facility]  Total GAD-7 Score 6 7 6 10 9    PHQ2-9    Flowsheet Row Office Visit from 01/07/2024 in [facility] [place] - A Dept Of [place]. [facility] Office Visit from 10/21/2023 in [facility] Reg Ctr Infect  Dis - A Dept Of [place]. [facility] Office Visit from 06/14/2023 in [facility] [place] - A Dept Of [place]. [facility] Office Visit from 04/10/2023 in [facility] - A Dept Of [place].  [facility] Office Visit from 04/09/2023 in [facility] Infect Dis - A Dept Of [place]. [facility]  PHQ-2 Total Score 0 0 1 5 0  PHQ-9 Total Score -- -- 5 6 --   Flowsheet Row ED from 05/13/2022 in [facility] Emergency Department at [facility] Office Visit from 02/01/2022 in [facility] Clinical Support from 02/20/2021 in [facility]  C-SSRS RISK CATEGORY No Risk No Risk No Risk    Collaboration of Care: none   I provided 30 minutes of face-to-face time during this encounter.  Karleen Kaufmann, MD 04/17/2024, 2:49 PM

## 2024-05-01 ENCOUNTER — Ambulatory Visit (INDEPENDENT_AMBULATORY_CARE_PROVIDER_SITE_OTHER): Payer: Medicare (Managed Care) | Admitting: Student

## 2024-05-01 DIAGNOSIS — G2119 Other drug induced secondary parkinsonism: Secondary | ICD-10-CM

## 2024-05-01 DIAGNOSIS — F200 Paranoid schizophrenia: Secondary | ICD-10-CM

## 2024-05-14 NOTE — Progress Notes (Signed)
 [facility] MD Outpatient Progress Note  05/01/2024 Curtis Mcdonald  MRN:  [id number]  Assessment:  Curtis Mcdonald presents for follow-up evaluation.  Continued improvement, which appears to have occurred gradually over the past few months with discontinuation of Ingrezza  and switch in antipsychotic to Seroquel . No medication changes.  F/u on 8/15. Will see Dr. Margaret a few days before.    Identifying Information: Curtis Mcdonald is a 68 y.o. male with a history of schizophrenia and anxiety who is an established patient with [facility] for treatment of schizophrenia.  Plan:  # Schizophrenia  Anxiety  tardive dyskinesia Interventions: -Continue Seroquel  100 mg nightly -- Continue Buspar  10 mg TID -Patient reports constipation, encouraged the patient to use MiraLAX  #Long term use of antipsychotic medication - EKG July 2024 with NSR QTc of 474. I sent a message requesting that his primary care provider repeat this test.  He is scheduled to see this person in October. - Lipids unremarkable in July 2024 - A1c 5.2 in July 2024 - AIMS of 0 on 11/01/2022, 01/24/2023; AIMS of 3 on 07/04/2023 and 08/16/2023 for tongue movements ; AIMS 0 on 10/11/2023 ; parkinsonism noted on 12/10/2023 ; parkinsonism significantly improved on 01/09/2024.  02/03/2024 exam showing parkinsonism similar to previous visit.  No tardive tongue movements.  Exam on 5/29, no tardive movements noted, though his tongue protrudes from his mouth at various times.  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  He was started on Ingrezza  for TD shortly thereafter in early 2025.  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  He improved significantly after discontinuation of the medication and switch from Latuda  to Seroquel . Believe patient has some predisposition to parkinsonism. Will see neuro on 8/12.  # Sleep Past medication trials: unknown Status of problem:  stable Interventions: -- Continue trazodone  300 mg nightly  # Tremor, most likely essential tremor Past medication trials: none Interventions:  -- Continue propranolol  20 mg twice daily.  Patient was given contact information for behavioral health clinic and was instructed to call 911 for emergencies.   Subjective:  Chief Complaint:  Chief Complaint  Patient presents with   Follow-up    Interval History:  Engages fairly well - says church and things at home are going well. Not malodorous, which is an improvement.  AH and paranoia unchanged from previous assessments - has been consistent for over 1 year. No SI/HI. Mood is good. Patient is happy with medications.      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 Paranoid schizophrenia (HCC)  F20.0            Past Psychiatric History:  Diagnoses: schizophrenia, anxiety, cocaine abuse  Medication trials: unknown Hospitalizations: denies Suicide attempts: denies Hx of abuse: denies Substance use:   -- Denies use of cannabis, cocaine (last used in 2004), or illicit drug use  -- Etoh: denies  -- Tobacco: denies  -- Caffeine: 1 cup once a week  Past Medical History:  Past Medical History:  Diagnosis Date   Anxiety    Chronic hepatitis C without hepatic coma (HCC) 02/04/2023   Paranoid schizophrenia (HCC)    Stroke (HCC) 2017   Vaccine counseling 08/01/2023    Past Surgical History:  Procedure Laterality Date   COLONOSCOPY  2013   WISDOM TOOTH EXTRACTION      Family Psychiatric History: denies  Family History:  Family History  Problem Relation Age of Onset   CAD Neg Hx    Diabetes Neg Hx    Colon cancer Neg Hx    Colon polyps Neg Hx    Esophageal cancer Neg Hx    Rectal cancer Neg Hx    Stomach cancer Neg Hx     Social History:  Social History   Socioeconomic History   Marital status: Single    Spouse name: Not on file   Number of children: Not on file    Years of education: Not on file   Highest education level: GED or equivalent  Occupational History   Occupation: Atm assembly    Comment: [employer]  Tobacco Use   Smoking status: Never   Smokeless tobacco: Never  Vaping Use   Vaping status: Never Used  Substance and Sexual Activity   Alcohol use: No    Comment: fomer    Drug use: No   Sexual activity: Not Currently    Birth control/protection: Abstinence  Other Topics Concern   Not on file  Social History Narrative   Not on file   Social Drivers of Health   Financial Resource Strain: Low Risk  (12/29/2022)   Overall Financial Resource Strain (CARDIA)    Difficulty of Paying Living Expenses: Not hard at all  Food Insecurity: No Food Insecurity (12/29/2022)   Hunger Vital Sign    Worried About Running Out of Food in the Last Year: Never true    Ran Out of Food in the Last Year: Never true  Transportation Needs: Unknown (12/29/2022)   PRAPARE - Administrator, Civil Service (Medical): Not on file    Lack of Transportation (Non-Medical): No  Physical Activity: Unknown (12/29/2022)   Exercise Vital Sign    Days of Exercise per Week: Patient declined    Minutes of Exercise per Session: Not on file  Stress: Stress Concern Present (12/29/2022)   [employer] of Occupational Health - Occupational Stress Questionnaire    Feeling of Stress : To some extent  Social Connections: Moderately Isolated (12/29/2022)   Social Connection and Isolation Panel    Frequency of Communication with Friends and Family: Never    Frequency of Social Gatherings with Friends and Family: Never    Attends Religious Services: More than 4 times per year    Active Member of [employer] or Organizations: Yes    Attends Engineer, structural: More than 4 times per year    Marital Status: Never married    Allergies: No Known Allergies  Current Medications: Current Outpatient Medications  Medication Sig Dispense Refill   Elastic Bandages &  Supports (LUMBAR BACK BRACE/SUPPORT PAD) MISC 1 each by Does not apply route daily. PLEASE FAX TO [phone] along with demographics and insurance information 1 each 0   meloxicam  (MOBIC ) 7.5 MG tablet TAKE 1 TO 2 TABLETS BY MOUTH DAILY 60 tablet 2   propranolol  (INDERAL ) 20 MG tablet Take 1 tablet (20 mg total) by mouth 2 (two) times daily. 60 tablet 2   QUEtiapine  (SEROQUEL ) 100 MG tablet Take 1 tablet (100 mg total) by mouth at bedtime. 30 tablet 2   Sofosbuvir -Velpatasvir  (EPCLUSA ) 400-100 MG TABS Take 1 tablet by mouth daily. 28 tablet 2   trazodone  (DESYREL ) 300 MG tablet Take 1 tablet (300 mg total) by mouth at bedtime. 30 tablet 2   Vitamin D , Ergocalciferol , (DRISDOL ) 1.25 MG (50000 UNIT) CAPS capsule Take 1 capsule (50,000 Units total) by mouth every 7 (seven)  days. 12 capsule 2   No current facility-administered medications for this visit.     Objective:  Psychiatric Specialty Exam: There were no vitals taken for this visit.There is no height or weight on file to calculate BMI.  General Appearance: Casual and Fairly Groomed  Eye Contact:  Good  Speech:  Clear and Coherent and Normal Rate  Volume:  Normal  Mood:  good  Affect: Constricted  Thought Content: occasional and fleeting AH  Suicidal Thoughts:  No  Homicidal Thoughts:  No  Thought Process:  Goal Directed and Linear  Orientation:  Full (Time, Place, and Person)    Memory:  Grossly intact  Judgment:  Good  Insight:  Good  Concentration:  Concentration: Good  Recall:  NA  Fund of Knowledge: Good  Language: Good  Psychomotor Activity: Normal  Akathisia:  No  AIMS (if indicated): see above  Assets:  Communication Skills Desire for Improvement Housing Leisure Time Physical Health Transportation  ADL's:  Intact  Cognition: WNL  Sleep:  Good   Physical Exam Constitutional:      Appearance: the patient is not toxic-appearing.  Pulmonary:     Effort: Pulmonary effort is normal.  Neurological:       Mental Status: the patient is alert and oriented to person, place, and time.   Review of Systems  Respiratory:  Negative for shortness of breath.   Cardiovascular:  Negative for chest pain.  Gastrointestinal:  Negative for abdominal pain, constipation, diarrhea, nausea and vomiting.  Neurological:  Negative for headaches.    Metabolic Disorder Labs: Lab Results  Component Value Date   HGBA1C 5.2 04/10/2023   MPG 100 04/12/2016   No results found for: PROLACTIN Lab Results  Component Value Date   CHOL 114 04/10/2023   TRIG 70 04/10/2023   HDL 37 (L) 04/10/2023   CHOLHDL 3.1 04/10/2023   VLDL 14 04/12/2016   LDLCALC 62 04/10/2023   LDLCALC 60 11/20/2022   Lab Results  Component Value Date   TSH 0.330 (L) 04/12/2016    Therapeutic Level Labs: No results found for: LITHIUM No results found for: VALPROATE No results found for: CBMZ  Screenings:  AIMS    Flowsheet Row Clinical Support from 10/06/2020 in [facility]  AIMS Total Score 0   GAD-7    Flowsheet Row Office Visit from 06/14/2023 in [facility] - A Dept Of [facility] Office Visit from 04/10/2023 in [facility] - A Dept Of [place]. [facility] Office Visit from 01/02/2023 in [facility] - A Dept Of [place]. [facility] Office Visit from 11/20/2022 in [facility] - A Dept Of [place] [facility]. [facility] Video Visit from 05/11/2022 in [facility]  Total GAD-7 Score 6 7 6 10 9    PHQ2-9    Flowsheet Row Office Visit from 01/07/2024 in [facility] - A Dept Of [place]. [facility] Office Visit from 10/21/2023 in [facility] - A Dept Of [place]. [facility] Office Visit from 06/14/2023 in [facility] [place] - A Dept Of [place]. [facility] Office Visit from  04/10/2023 in [facility] - A Dept Of [place]. [facility] Office Visit from 04/09/2023 in [facility] - A Dept Of [place]. [facility]  PHQ-2 Total Score 0 0 1 5 0  PHQ-9 Total Score -- -- 5 6 --   Flowsheet Row ED from 05/13/2022 in [facility] Department at [facility] Office Visit from 02/01/2022 in [facility] Clinical Support from 02/20/2021 in [facility]  C-SSRS RISK CATEGORY No Risk No Risk No Risk    Collaboration of Care: none   I provided 30 minutes of face-to-face time during this encounter.  Karleen Kaufmann, MD 05/14/2024, 7:46 AM

## 2024-05-19 ENCOUNTER — Ambulatory Visit (INDEPENDENT_AMBULATORY_CARE_PROVIDER_SITE_OTHER): Payer: Medicare (Managed Care) | Admitting: Diagnostic Neuroimaging

## 2024-05-19 ENCOUNTER — Encounter: Payer: Self-pay | Admitting: Diagnostic Neuroimaging

## 2024-05-19 VITALS — BP 136/82 | HR 89 | Ht 69.0 in | Wt 175.0 lb

## 2024-05-19 DIAGNOSIS — G2119 Other drug induced secondary parkinsonism: Secondary | ICD-10-CM

## 2024-05-19 NOTE — Progress Notes (Signed)
 [facility]  PATIENT: Curtis Mcdonald DOB: [birth year]  REFERRING CLINICIAN: Theotis Haze ORN, NP HISTORY FROM: patient  REASON FOR VISIT: new consult   HISTORICAL  CHIEF COMPLAINT:  Chief Complaint  Patient presents with   Tremors    Rm 7 alone Pt is well, reports he has been having bilateral hand tremors for 6-12 months. Symptoms have progressed over times. He also mentions L leg tremor occasional.  No known family history of tremor disorders.     HISTORY OF PRESENT ILLNESS:   UPDATE (05/19/24, VRP): 68 year old left handed male here for termor evaluation. Patient notes 6-12 months of intermittent postural and action tremor in left arm / hand. Some intermittent resting tremor. Denies any gait or balance issues. No speech or swallow issues. History of schizophrenia, has been on latuda  (prior to onset of tremor), propranolol  (for tremor), ingrezza  (for TD, then stopped due to worsening tremor), now seroquel .   PRIOR HPI (Dr. Marry, staff message 05/14/24):  -Seen by NP for initial eval about 2 years ago, and started on Latuda  for schizophrenia.  -Seen by myself for follow up about 6 months later, apparently med adherent and doing well. Patient has never had out of control psychotic symptoms on my assessments. High amplitude R hand tremor noted. Continued on Latuda , started propranolol  for tremor, which seems to have always been ineffective.  -TD noted about 6 months later - Ingrezza  started  -Did fine with Ingrezza  until dose change from 40 to 80 mg - patient presented to clinic malodorous with signs of parkinsonism. Ingrezza  tapered off quickly.  -Some improvement  -Switched Latuda  to Seroquel   -Patient back to baseline at his most recent eval with me on 7/25   REVIEW OF SYSTEMS: Full 14 system review of systems performed and negative with exception of: as per HPI.  ALLERGIES: No Known Allergies  HOME MEDICATIONS: Outpatient Medications Prior to Visit   Medication Sig Dispense Refill   busPIRone  (BUSPAR ) 10 MG tablet Take 10 mg by mouth 3 (three) times daily.     meloxicam  (MOBIC ) 7.5 MG tablet TAKE 1 TO 2 TABLETS BY MOUTH DAILY 60 tablet 2   propranolol  (INDERAL ) 20 MG tablet Take 1 tablet (20 mg total) by mouth 2 (two) times daily. 60 tablet 2   QUEtiapine  (SEROQUEL ) 100 MG tablet Take 1 tablet (100 mg total) by mouth at bedtime. 30 tablet 2   Vitamin D , Ergocalciferol , (DRISDOL ) 1.25 MG (50000 UNIT) CAPS capsule Take 1 capsule (50,000 Units total) by mouth every 7 (seven) days. 12 capsule 2   Elastic Bandages & Supports (LUMBAR BACK BRACE/SUPPORT PAD) MISC 1 each by Does not apply route daily. PLEASE FAX TO [phone] along with demographics and insurance information (Patient not taking: Reported on 05/19/2024) 1 each 0   Sofosbuvir -Velpatasvir  (EPCLUSA ) 400-100 MG TABS Take 1 tablet by mouth daily. (Patient not taking: Reported on 05/19/2024) 28 tablet 2   trazodone  (DESYREL ) 300 MG tablet Take 1 tablet (300 mg total) by mouth at bedtime. (Patient not taking: Reported on 05/19/2024) 30 tablet 2   No facility-administered medications prior to visit.    PAST MEDICAL HISTORY: Past Medical History:  Diagnosis Date   Anxiety    Chronic hepatitis C without hepatic coma (HCC) 02/04/2023   Paranoid schizophrenia (HCC)    Stroke (HCC) 2017   Vaccine counseling 08/01/2023    PAST SURGICAL HISTORY: Past Surgical History:  Procedure Laterality Date   COLONOSCOPY  2013   WISDOM TOOTH EXTRACTION  FAMILY HISTORY: Family History  Problem Relation Age of Onset   CAD Neg Hx    Diabetes Neg Hx    Colon cancer Neg Hx    Colon polyps Neg Hx    Esophageal cancer Neg Hx    Rectal cancer Neg Hx    Stomach cancer Neg Hx     SOCIAL HISTORY: Social History   Socioeconomic History   Marital status: Single    Spouse name: Not on file   Number of children: Not on file   Years of education: Not on file   Highest education level: GED or  equivalent  Occupational History   Occupation: Atm assembly    Comment: [employer]  Tobacco Use   Smoking status: Never   Smokeless tobacco: Never  Vaping Use   Vaping status: Never Used  Substance and Sexual Activity   Alcohol use: No    Comment: fomer    Drug use: No   Sexual activity: Not Currently    Birth control/protection: Abstinence  Other Topics Concern   Not on file  Social History Narrative   Not on file   Social Drivers of Health   Financial Resource Strain: Low Risk  (12/29/2022)   Overall Financial Resource Strain (CARDIA)    Difficulty of Paying Living Expenses: Not hard at all  Food Insecurity: No Food Insecurity (12/29/2022)   Hunger Vital Sign    Worried About Running Out of Food in the Last Year: Never true    Ran Out of Food in the Last Year: Never true  Transportation Needs: Unknown (12/29/2022)   PRAPARE - Administrator, Civil Service (Medical): Not on file    Lack of Transportation (Non-Medical): No  Physical Activity: Unknown (12/29/2022)   Exercise Vital Sign    Days of Exercise per Week: Patient declined    Minutes of Exercise per Session: Not on file  Stress: Stress Concern Present (12/29/2022)   [employer] of Occupational Health - Occupational Stress Questionnaire    Feeling of Stress : To some extent  Social Connections: Moderately Isolated (12/29/2022)   Social Connection and Isolation Panel    Frequency of Communication with Friends and Family: Never    Frequency of Social Gatherings with Friends and Family: Never    Attends Religious Services: More than 4 times per year    Active Member of [employer] or Organizations: Yes    Attends Banker Meetings: More than 4 times per year    Marital Status: Never married  Intimate Partner Violence: Not At Risk (03/30/2020)   Humiliation, Afraid, Rape, and Kick questionnaire    Fear of Current or Ex-Partner: No    Emotionally Abused: No    Physically Abused: No    Sexually  Abused: No     PHYSICAL EXAM  GENERAL EXAM/CONSTITUTIONAL: Vitals:  Vitals:   05/19/24 1101  BP: 136/82  Pulse: 89  Weight: 175 lb (79.4 kg)  Height: 5' 9 (1.753 m)   Body mass index is 25.84 kg/m. Wt Readings from Last 3 Encounters:  05/19/24 175 lb (79.4 kg)  01/07/24 161 lb 6.4 oz (73.2 kg)  10/21/23 186 lb (84.4 kg)   Patient is in no distress; well developed, nourished and groomed; neck is supple  CARDIOVASCULAR: Examination of carotid arteries is normal; no carotid bruits Regular rate and rhythm, no murmurs Examination of peripheral vascular system by observation and palpation is normal  EYES: Ophthalmoscopic exam of optic discs and posterior segments is normal; no papilledema  or hemorrhages No results found.  MUSCULOSKELETAL: Gait, strength, tone, movements noted in Neurologic exam below  NEUROLOGIC: MENTAL STATUS:      No data to display         awake, alert, oriented to person, place and time recent and remote memory intact normal attention and concentration language fluent, comprehension intact, naming intact fund of knowledge appropriate FLAT AFFECT  CRANIAL NERVE:  2nd - no papilledema on fundoscopic exam 2nd, 3rd, 4th, 6th - pupils equal and reactive to light, visual fields full to confrontation, extraocular muscles intact, no nystagmus 5th - facial sensation symmetric 7th - facial strength symmetric 8th - hearing intact 9th - palate elevates symmetrically, uvula midline 11th - shoulder shrug symmetric 12th - tongue protrusion midline  MOTOR:  normal bulk and tone; EXCEPT SLIGHT COGWHEELING RIGIDITY IN LUE WITH CONTRALATERAL REINFORCEMENT RARE, INTERMITTENT RESTING TREMOR IN LUE MINIMAL FINE POSTURAL TREMOR IN LUE MINIMAL ACTION TREMOR IN LUE NO SIGNIFICANT BRADYKINESIA HANDWRITING NORMAL WITHOUT DECREMENT ARCHIMEDES SPIRALS ARE SMOOTH FULL STRENGTH IN BUE AND BLE  SENSORY:  normal and symmetric to light touch, temperature,  vibration  COORDINATION:  finger-nose-finger, fine finger movements normal  REFLEXES:  deep tendon reflexes 1+ and symmetric  GAIT/STATION:  narrow based gait; GOOD ARM SWING     DIAGNOSTIC DATA (LABS, IMAGING, TESTING) - I reviewed patient records, labs, notes, testing and imaging myself where available.  Lab Results  Component Value Date   WBC 5.8 08/01/2023   HGB 13.8 08/01/2023   HCT 41.3 08/01/2023   MCV 99.5 08/01/2023   PLT 105 (L) 08/01/2023      Component Value Date/Time   NA 135 01/07/2024 1005   K 4.1 01/07/2024 1005   CL 97 01/07/2024 1005   CO2 24 01/07/2024 1005   GLUCOSE 81 01/07/2024 1005   GLUCOSE 81 08/01/2023 1045   BUN 6 (L) 01/07/2024 1005   CREATININE 1.01 01/07/2024 1005   CREATININE 1.08 08/01/2023 1045   CALCIUM 9.3 01/07/2024 1005   PROT 7.3 01/07/2024 1005   ALBUMIN 4.0 01/07/2024 1005   AST 36 01/07/2024 1005   ALT 15 01/07/2024 1005   ALKPHOS 86 01/07/2024 1005   BILITOT 1.1 01/07/2024 1005   GFRNONAA >60 05/13/2022 1320   GFRAA >60 04/12/2016 0250   Lab Results  Component Value Date   CHOL 114 04/10/2023   HDL 37 (L) 04/10/2023   LDLCALC 62 04/10/2023   TRIG 70 04/10/2023   CHOLHDL 3.1 04/10/2023   Lab Results  Component Value Date   HGBA1C 5.2 04/10/2023   Lab Results  Component Value Date   VITAMINB12 423 04/13/2016   Lab Results  Component Value Date   TSH 0.330 (L) 04/12/2016    05/13/22 CT head [I reviewed images myself and agree with interpretation. -VRP]  1. No evidence of acute intracranial abnormality. 2. Chronic right thalamic lacunar infarct.   ASSESSMENT AND PLAN  68 y.o. year old male here with:   Dx:  1. Drug-induced parkinsonism (HCC)     PLAN:  INTERMITTENT RESTING TREMOR, MILD COGWHEELING RIGIDITY IN LUE, RARE POSTURAL TREMOR, FLAT AFFECT (no bradykinesia or gait issues) - likely drug induced parkinsonism (prior latuda  and ingrezza ; current seroquel ) - continue current meds; monitor for  symptom progression --> if significant progression, we can consider DATscan to further evaluate - even if underlying idiopathic parkinson's disease is confirmed, treatment with carb/levo could be challenging as this could exacerbate schizophrenia symptoms - consider to wean off of propranolol  (not that effective; does  not seem to be essential tremor)  Return for pending if symptoms worsen or fail to improve, return to referring provider.    EDUARD FABIENE HANLON, MD 05/19/2024, 11:51 AM Certified in Neurology, Neurophysiology and Neuroimaging  [facility] [address] [place], [place] [address] [phone]

## 2024-05-19 NOTE — Patient Instructions (Addendum)
  INTERMITTENT RESTING TREMOR, MILD COGWHEELING RIGIDITY IN LUE, RARE POSTURAL TREMOR, FLAT AFFECT (no bradykinesia or gait issues) - likely drug induced parkinsonism (prior latuda  and ingrezza ; current seroquel ) - continue current meds; monitor for symptom progression --> if significant progression, we can consider DATscan to further evaluate - consider to wean off of propranolol  (not that effective; does not seem to be essential tremor)

## 2024-05-22 ENCOUNTER — Ambulatory Visit (HOSPITAL_COMMUNITY): Payer: Medicare (Managed Care) | Admitting: Student

## 2024-05-22 DIAGNOSIS — Z79899 Other long term (current) drug therapy: Secondary | ICD-10-CM

## 2024-05-22 DIAGNOSIS — F200 Paranoid schizophrenia: Secondary | ICD-10-CM

## 2024-05-22 DIAGNOSIS — G250 Essential tremor: Secondary | ICD-10-CM

## 2024-05-22 DIAGNOSIS — F411 Generalized anxiety disorder: Secondary | ICD-10-CM

## 2024-05-22 MED ORDER — QUETIAPINE FUMARATE 100 MG PO TABS
100.0000 mg | ORAL_TABLET | Freq: Every day | ORAL | 2 refills | Status: AC
Start: 2024-05-22 — End: ?

## 2024-05-22 MED ORDER — BUSPIRONE HCL 10 MG PO TABS
10.0000 mg | ORAL_TABLET | Freq: Three times a day (TID) | ORAL | 2 refills | Status: AC
Start: 2024-05-22 — End: ?

## 2024-05-22 MED ORDER — TRAZODONE HCL 300 MG PO TABS: 300.0000 mg | ORAL_TABLET | Freq: Every evening | ORAL | 2 refills | Status: DC

## 2024-05-22 MED ORDER — PROPRANOLOL HCL 20 MG PO TABS: 20.0000 mg | ORAL_TABLET | Freq: Two times a day (BID) | ORAL | 2 refills | Status: DC

## 2024-05-22 NOTE — Progress Notes (Signed)
 [facility] MD Outpatient Progress Note  05/22/2024 Curtis Mcdonald  MRN:  [id number]  Assessment:  Curtis Mcdonald presents for follow-up evaluation.  Patient appears to be doing well: Psychotic symptoms minimal and at baseline, physically minimal upper extremity tremulousness, some cogwheeling remains mainly felt in his right wrist, slight potentially tardive tongue movements noted only with reinforcement.  Would only score a 1 on the AIMS.  Not bothersome to the patient.  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  He does not think the patient needs to be taken off of current psychotropics.  Did recommend consideration of tapering off propranolol  given lack of efficacy, which we will consider at the next appointment.  Follow up in 2 months.  Scheduled to repeat labs beforehand.  We will get updated EKG with PCP.   Identifying Information: Curtis Mcdonald is a 68 y.o. male with a history of schizophrenia and anxiety who is an established patient with [facility] for treatment of schizophrenia.  Plan:  # Schizophrenia  Anxiety  tardive dyskinesia Interventions: -Continue Seroquel  100 mg nightly -- Continue Buspar  10 mg TID -Patient reports constipation, encouraged the patient to use MiraLAX  #Long term use of antipsychotic medication - EKG July 2024 with NSR QTc of 474. I sent a message requesting that his primary care provider repeat this test.  He is scheduled to see this person in October. - Lipids unremarkable in July 2024 - A1c 5.2 in July 2024 - AIMS of 0 on 11/01/2022, 01/24/2023; AIMS of 3 on 07/04/2023 and 08/16/2023 for tongue movements ; AIMS 0 on 10/11/2023 ; parkinsonism noted on 12/10/2023 ; parkinsonism significantly improved on 01/09/2024.  02/03/2024 exam showing parkinsonism similar to previous visit.  No tardive tongue movements.  Exam on 5/29, no tardive movements noted, though his tongue protrudes from his mouth at various times.  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  He was started on Ingrezza  for TD shortly thereafter in early 2025.  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  He improved significantly after discontinuation of the medication and switch from Latuda  to Seroquel . Believe patient has some predisposition to parkinsonism.  - 8/15, only has very mild tardive movements of his tongue, only apparent with reinforcement. AIMS 1.  Upper extremity tremulousness minimal, only apparent with reinforcement.  # Sleep Past medication trials: unknown Status of problem: stable Interventions: -- Continue trazodone  300 mg nightly  # Tremor, most likely essential tremor Past medication trials: none Interventions:  -- Continue propranolol  20 mg twice daily.  Patient was given contact information for behavioral health clinic and was instructed to call 911 for emergencies.   Subjective:  Chief Complaint:  Chief Complaint  Patient presents with   Follow-up    Interval History:  Patient reports regular attendance at church, which is his primary social engagement.  He says that he spends most of the day reading the Bible.  Reports an occasional voice that says be on the lookout.  Not bothersome to him and does not change his behavior.  No other psychotic symptoms.   Visit Diagnosis:    ICD-10-CM   1. Generalized anxiety disorder  F41.1 busPIRone  (BUSPAR ) 10 MG tablet    2. Essential tremor  G25.0 propranolol  (INDERAL ) 20 MG tablet    3. Paranoid schizophrenia (HCC)  F20.0 QUEtiapine  (SEROQUEL ) 100 MG tablet    trazodone  (DESYREL ) 300 MG tablet  Past Psychiatric History:  Diagnoses: schizophrenia, anxiety, cocaine abuse  Medication trials: unknown Hospitalizations: denies Suicide attempts: denies Hx of abuse: denies Substance use:   -- Denies use of cannabis, cocaine (last used in 2004), or illicit drug use  -- Etoh: denies  -- Tobacco:  denies  -- Caffeine: 1 cup once a week  Past Medical History:  Past Medical History:  Diagnosis Date   Anxiety    Chronic hepatitis C without hepatic coma (HCC) 02/04/2023   Paranoid schizophrenia (HCC)    Stroke (HCC) 2017   Vaccine counseling 08/01/2023    Past Surgical History:  Procedure Laterality Date   COLONOSCOPY  2013   WISDOM TOOTH EXTRACTION      Family Psychiatric History: denies  Family History:  Family History  Problem Relation Age of Onset   CAD Neg Hx    Diabetes Neg Hx    Colon cancer Neg Hx    Colon polyps Neg Hx    Esophageal cancer Neg Hx    Rectal cancer Neg Hx    Stomach cancer Neg Hx     Social History:  Social History   Socioeconomic History   Marital status: Single    Spouse name: Not on file   Number of children: Not on file   Years of education: Not on file   Highest education level: GED or equivalent  Occupational History   Occupation: Atm assembly    Comment: [employer]  Tobacco Use   Smoking status: Never   Smokeless tobacco: Never  Vaping Use   Vaping status: Never Used  Substance and Sexual Activity   Alcohol use: No    Comment: fomer    Drug use: No   Sexual activity: Not Currently    Birth control/protection: Abstinence  Other Topics Concern   Not on file  Social History Narrative   Not on file   Social Drivers of Health   Financial Resource Strain: Low Risk  (12/29/2022)   Overall Financial Resource Strain (CARDIA)    Difficulty of Paying Living Expenses: Not hard at all  Food Insecurity: No Food Insecurity (12/29/2022)   Hunger Vital Sign    Worried About Running Out of Food in the Last Year: Never true    Ran Out of Food in the Last Year: Never true  Transportation Needs: Unknown (12/29/2022)   PRAPARE - Administrator, Civil Service (Medical): Not on file    Lack of Transportation (Non-Medical): No  Physical Activity: Unknown (12/29/2022)   Exercise Vital Sign    Days of Exercise per Week: Patient  declined    Minutes of Exercise per Session: Not on file  Stress: Stress Concern Present (12/29/2022)   [employer] of Occupational Health - Occupational Stress Questionnaire    Feeling of Stress : To some extent  Social Connections: Moderately Isolated (12/29/2022)   Social Connection and Isolation Panel    Frequency of Communication with Friends and Family: Never    Frequency of Social Gatherings with Friends and Family: Never    Attends Religious Services: More than 4 times per year    Active Member of [employer] or Organizations: Yes    Attends Engineer, structural: More than 4 times per year    Marital Status: Never married    Allergies: No Known Allergies  Current Medications: Current Outpatient Medications  Medication Sig Dispense Refill   busPIRone  (BUSPAR ) 10 MG tablet Take 1 tablet (10 mg total) by mouth 3 (three) times daily. 90 tablet  2   Elastic Bandages & Supports (LUMBAR BACK BRACE/SUPPORT PAD) MISC 1 each by Does not apply route daily. PLEASE FAX TO [phone] along with demographics and insurance information (Patient not taking: Reported on 05/19/2024) 1 each 0   meloxicam  (MOBIC ) 7.5 MG tablet TAKE 1 TO 2 TABLETS BY MOUTH DAILY 60 tablet 2   propranolol  (INDERAL ) 20 MG tablet Take 1 tablet (20 mg total) by mouth 2 (two) times daily. 60 tablet 2   QUEtiapine  (SEROQUEL ) 100 MG tablet Take 1 tablet (100 mg total) by mouth at bedtime. 30 tablet 2   Sofosbuvir -Velpatasvir  (EPCLUSA ) 400-100 MG TABS Take 1 tablet by mouth daily. (Patient not taking: Reported on 05/19/2024) 28 tablet 2   trazodone  (DESYREL ) 300 MG tablet Take 1 tablet (300 mg total) by mouth at bedtime. 30 tablet 2   Vitamin D , Ergocalciferol , (DRISDOL ) 1.25 MG (50000 UNIT) CAPS capsule Take 1 capsule (50,000 Units total) by mouth every 7 (seven) days. 12 capsule 2   No current facility-administered medications for this visit.     Objective:  Psychiatric Specialty Exam: There were no vitals taken  for this visit.There is no height or weight on file to calculate BMI.  General Appearance: Casual and Fairly Groomed  Eye Contact:  Good  Speech:  Clear and Coherent and Normal Rate  Volume:  Normal  Mood:  good  Affect: Constricted  Thought Content: occasional and fleeting AH  Suicidal Thoughts:  No  Homicidal Thoughts:  No  Thought Process:  Goal Directed and Linear  Orientation:  Full (Time, Place, and Person)    Memory:  Grossly intact  Judgment:  Good  Insight:  Good  Concentration:  Concentration: Good  Recall:  NA  Fund of Knowledge: Good  Language: Good  Psychomotor Activity: Normal  Akathisia:  No  AIMS (if indicated): see above  Assets:  Communication Skills Desire for Improvement Housing Leisure Time Physical Health Transportation  ADL's:  Intact  Cognition: WNL  Sleep:  Good   Physical Exam Constitutional:      Appearance: the patient is not toxic-appearing.  Pulmonary:     Effort: Pulmonary effort is normal.  Neurological:      Mental Status: the patient is alert and oriented to person, place, and time.   Review of Systems  Respiratory:  Negative for shortness of breath.   Cardiovascular:  Negative for chest pain.  Gastrointestinal:  Negative for abdominal pain, constipation, diarrhea, nausea and vomiting.  Neurological:  Negative for headaches.    Metabolic Disorder Labs: Lab Results  Component Value Date   HGBA1C 5.2 04/10/2023   MPG 100 04/12/2016   No results found for: PROLACTIN Lab Results  Component Value Date   CHOL 114 04/10/2023   TRIG 70 04/10/2023   HDL 37 (L) 04/10/2023   CHOLHDL 3.1 04/10/2023   VLDL 14 04/12/2016   LDLCALC 62 04/10/2023   LDLCALC 60 11/20/2022   Lab Results  Component Value Date   TSH 0.330 (L) 04/12/2016    Therapeutic Level Labs: No results found for: LITHIUM No results found for: VALPROATE No results found for: CBMZ  Screenings:  AIMS    Flowsheet Row Clinical Support from  10/06/2020 in [facility]  AIMS Total Score 0   GAD-7    Flowsheet Row Office Visit from 06/14/2023 in [facility] - A Dept Of [place]. [facility] Office Visit from 04/10/2023 in [facility] - A Dept Of [place]  [facility] Office Visit from 01/02/2023 in [facility] - A Dept Of [place] DEL. [facility] Office Visit from 11/20/2022 in [facility] - A Dept Of [place] DEL. [facility] Video Visit from 05/11/2022 in [facility]  Total GAD-7 Score 6 7 6 10 9    PHQ2-9    Flowsheet Row Office Visit from 01/07/2024 in [facility] [place] - A Dept Of [place]. [facility] Office Visit from 10/21/2023 in [facility] - A Dept Of [place]. [facility] Office Visit from 06/14/2023 in [facility] [place] - A Dept Of [place] DEL. [facility] Office Visit from 04/10/2023 in [facility] - A Dept Of [place] DEL. [facility] Office Visit from 04/09/2023 in [facility] - A Dept Of [place]. [facility]  PHQ-2 Total Score 0 0 1 5 0  PHQ-9 Total Score -- -- 5 6 --   Flowsheet Row ED from 05/13/2022 in [facility] Office Visit from 02/01/2022 in [facility] Clinical Support from 02/20/2021 in [facility]  C-SSRS RISK CATEGORY No Risk No Risk No Risk    Collaboration of Care: none   I provided 30 minutes of face-to-face time during this encounter.  Karleen Kaufmann, MD 05/22/2024, 10:10 AM

## 2024-06-22 ENCOUNTER — Other Ambulatory Visit (INDEPENDENT_AMBULATORY_CARE_PROVIDER_SITE_OTHER): Payer: Medicare (Managed Care)

## 2024-06-22 DIAGNOSIS — Z79899 Other long term (current) drug therapy: Secondary | ICD-10-CM | POA: Diagnosis not present

## 2024-06-22 NOTE — Progress Notes (Signed)
 Patient presented to the office for labs, labs were drawn from left [facility] with no issue or complaints . Pt left office alert and ambulatory.

## 2024-06-25 LAB — LIPID PANEL

## 2024-06-25 LAB — HEMOGLOBIN A1C
Est. average glucose Bld gHb Est-mCnc: 105 mg/dL
Hgb A1c MFr Bld: 5.3 % (ref 4.8–5.6)

## 2024-07-08 ENCOUNTER — Ambulatory Visit: Payer: Medicare (Managed Care) | Attending: Nurse Practitioner | Admitting: Nurse Practitioner

## 2024-07-08 ENCOUNTER — Encounter: Payer: Self-pay | Admitting: Nurse Practitioner

## 2024-07-08 VITALS — BP 165/76 | HR 58 | Resp 18 | Ht 69.0 in | Wt 174.0 lb

## 2024-07-08 DIAGNOSIS — Z23 Encounter for immunization: Secondary | ICD-10-CM | POA: Diagnosis not present

## 2024-07-08 DIAGNOSIS — R972 Elevated prostate specific antigen [PSA]: Secondary | ICD-10-CM

## 2024-07-08 DIAGNOSIS — E559 Vitamin D deficiency, unspecified: Secondary | ICD-10-CM

## 2024-07-08 DIAGNOSIS — G8929 Other chronic pain: Secondary | ICD-10-CM

## 2024-07-08 DIAGNOSIS — Z0000 Encounter for general adult medical examination without abnormal findings: Secondary | ICD-10-CM

## 2024-07-08 DIAGNOSIS — M545 Low back pain, unspecified: Secondary | ICD-10-CM | POA: Diagnosis not present

## 2024-07-08 MED ORDER — TRAMADOL HCL 50 MG PO TABS: 50.0000 mg | ORAL_TABLET | Freq: Three times a day (TID) | ORAL | 0 refills | Status: AC | PRN

## 2024-07-08 MED ORDER — VITAMIN D (ERGOCALCIFEROL) 1.25 MG (50000 UNIT) PO CAPS: 50000.0000 [IU] | ORAL_CAPSULE | ORAL | 2 refills | Status: AC

## 2024-07-08 NOTE — Progress Notes (Signed)
 Assessment & Plan:  Curtis Mcdonald was seen today for annual exam.  Diagnoses and all orders for this visit:  Encounter for annual physical exam -     Lipid panel -     CBC with Differential -     CMP14+EGFR  Need for influenza vaccination -     Flu vaccine HIGH DOSE PF(Fluzone Trivalent)  Elevated PSA -     PSA  Chronic bilateral low back pain without sciatica -     traMADol (ULTRAM) 50 MG tablet; Take 1 tablet (50 mg total) by mouth every 8 (eight) hours as needed for severe pain (pain score 7-10). -     Ambulatory referral to Physical Medicine Rehab  Vitamin D  deficiency disease -     Vitamin D , Ergocalciferol , (DRISDOL ) 1.25 MG (50000 UNIT) CAPS capsule; Take 1 capsule (50,000 Units total) by mouth once a week.    Patient has been counseled on age-appropriate routine health concerns for screening and prevention. These are reviewed and up-to-date. Referrals have been placed accordingly. Immunizations are up-to-date or declined.    Subjective:   Chief Complaint  Patient presents with   Annual Exam    Curtis Mcdonald 68 y.o. male presents to office today for annual physical exam.  He has a past medical history of Anxiety, Chronic hepatitis C without hepatic coma (02/04/2023), Chronic back pain, Paranoid schizophrenia, Stroke (2017)   Blood pressure elevated today, atypical for him. - Recheck blood pressure before leaving clinic. BP Readings from Last 3 Encounters:  07/08/24 (!) 165/76  05/19/24 136/82  01/07/24 131/75     He experienced severe back pain after bending over last Thursday or Friday, rating it as a ten at its worst. Currently, the pain is around a four to five and is localized to his lower back. He has a history of CBP since 2017 and has previously undergone physical therapy, which was unhelpful and he was discharged due to lack of progress.  An x-ray was performed last year, and an MRI was recommended but not completed due to insurance preauthorization issues.   No numbness, burning, or tingling in his legs, and the pain does not interfere with his sleep. Several years (2017) on and off over the past few years. His wears a medical back brace that does seem to provide minimal relief of pain. Pain is Unrelated to any injury or trauma.  Aggravating factors: Bending over to tie shoes. Denies sciatica symptoms or incontinence of urine or stool.  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 Had to wear back brace at that time as well.   Lumbar xray 11-21-22 Minimal degenerative endplate changes in the lumbar spine.   Review of Systems  Constitutional:  Negative for fever, malaise/fatigue and weight loss.  HENT: Negative.  Negative for nosebleeds.   Eyes: Negative.  Negative for blurred vision, double vision and photophobia.  Respiratory: Negative.  Negative for cough and shortness of breath.   Cardiovascular: Negative.  Negative for chest pain, palpitations and leg swelling.  Gastrointestinal: Negative.  Negative for heartburn, nausea and vomiting.  Musculoskeletal:  Positive for back pain. Negative for myalgias.  Neurological:  Positive for tremors. Negative for dizziness, focal weakness, seizures and headaches.  Psychiatric/Behavioral:  Positive for memory loss. Negative for suicidal ideas.     Past Medical History:  Diagnosis Date   Anxiety    Chronic hepatitis C without hepatic coma (  HCC) 02/04/2023   Paranoid schizophrenia (HCC)    Stroke [facility]) 2017   Vaccine counseling 08/01/2023    Past Surgical History:  Procedure Laterality Date   COLONOSCOPY  2013   WISDOM TOOTH EXTRACTION      Family History  Problem Relation Age of Onset   CAD Neg Hx    Diabetes Neg Hx    Colon cancer Neg Hx    Colon polyps Neg Hx    Esophageal cancer Neg Hx    Rectal cancer Neg Hx    Stomach cancer Neg Hx     Social History Reviewed with no changes to be made today.    Outpatient Medications Prior to Visit  Medication Sig Dispense Refill   busPIRone  (BUSPAR ) 10 MG tablet Take 1 tablet (10 mg total) by mouth 3 (three) times daily. 90 tablet 2   Elastic Bandages & Supports (LUMBAR BACK BRACE/SUPPORT PAD) MISC 1 each by Does not apply route daily. PLEASE FAX TO [phone] along with demographics and insurance information 1 each 0   meloxicam  (MOBIC ) 7.5 MG tablet TAKE 1 TO 2 TABLETS BY MOUTH DAILY 60 tablet 2   propranolol  (INDERAL ) 20 MG tablet Take 1 tablet (20 mg total) by mouth 2 (two) times daily. 60 tablet 2   QUEtiapine  (SEROQUEL ) 100 MG tablet Take 1 tablet (100 mg total) by mouth at bedtime. 30 tablet 2   trazodone  (DESYREL ) 300 MG tablet Take 1 tablet (300 mg total) by mouth at bedtime. 30 tablet 2   Vitamin D , Ergocalciferol , (DRISDOL ) 1.25 MG (50000 UNIT) CAPS capsule Take 1 capsule (50,000 Units total) by mouth every 7 (seven) days. 12 capsule 2   Sofosbuvir -Velpatasvir  (EPCLUSA ) 400-100 MG TABS Take 1 tablet by mouth daily. (Patient not taking: Reported on 07/08/2024) 28 tablet 2   No facility-administered medications prior to visit.    No Known Allergies     Objective:    BP (!) 165/76 (BP Location: Left Arm, Patient Position: Sitting, Cuff Size: Normal)   Pulse (!) 58   Resp 18   Ht 5' 9 (1.753 m)   Wt 174 lb (78.9 kg)   SpO2 99%   BMI 25.70 kg/m  Wt Readings from Last 3 Encounters:  07/08/24 174 lb (78.9 kg)  05/19/24 175 lb (79.4 kg)  01/07/24 161 lb 6.4 oz (73.2 kg)    Physical Exam Vitals and nursing note reviewed.  Constitutional:      Appearance: He is well-developed.  HENT:     Head: Normocephalic and atraumatic.  Cardiovascular:     Rate and Rhythm: Regular rhythm. Bradycardia present.     Heart sounds: Normal heart sounds. No murmur heard.    No friction rub. No gallop.  Pulmonary:     Effort: Pulmonary effort is normal. No tachypnea or respiratory distress.     Breath sounds: Normal breath sounds. No  decreased breath sounds, wheezing, rhonchi or rales.  Chest:     Chest wall: No tenderness.  Abdominal:     General: Bowel sounds are normal.     Palpations: Abdomen is soft.  Musculoskeletal:        General: Normal range of motion.     Cervical back: Normal range of motion.     Comments: Wearing back brace  Skin:    General: Skin is warm and dry.  Neurological:     Mental Status: He is alert and oriented to person, place, and time.     Motor: Tremor present.  Coordination: Coordination normal.  Psychiatric:        Behavior: Behavior normal. Behavior is cooperative.        Thought Content: Thought content normal.        Judgment: Judgment normal.          Patient has been counseled extensively about nutrition and exercise as well as the importance of adherence with medications and regular follow-up. The patient was given clear instructions to go to ER or return to medical center if symptoms don't improve, worsen or new problems develop. The patient verbalized understanding.   Follow-up: Return in about 3 months (around 10/08/2024).   Haze LELON Servant, FNP-BC [facility] and [facility] [place], [place] [phone]   07/08/2024, 1:32 PM

## 2024-07-09 ENCOUNTER — Ambulatory Visit: Payer: Self-pay | Admitting: Nurse Practitioner

## 2024-07-09 DIAGNOSIS — D696 Thrombocytopenia, unspecified: Secondary | ICD-10-CM

## 2024-07-09 LAB — CBC WITH DIFFERENTIAL/PLATELET
Basophils Absolute: 0 x10E3/uL (ref 0.0–0.2)
Basos: 1 %
EOS (ABSOLUTE): 0.2 x10E3/uL (ref 0.0–0.4)
Eos: 5 %
Hematocrit: 45.6 % (ref 37.5–51.0)
Hemoglobin: 15.1 g/dL (ref 13.0–17.7)
Immature Grans (Abs): 0 x10E3/uL (ref 0.0–0.1)
Immature Granulocytes: 0 %
Lymphocytes Absolute: 1.6 x10E3/uL (ref 0.7–3.1)
Lymphs: 33 %
MCH: 33.5 pg — ABNORMAL HIGH (ref 26.6–33.0)
MCHC: 33.1 g/dL (ref 31.5–35.7)
MCV: 101 fL — ABNORMAL HIGH (ref 79–97)
Monocytes Absolute: 0.4 x10E3/uL (ref 0.1–0.9)
Monocytes: 8 %
Neutrophils Absolute: 2.6 x10E3/uL (ref 1.4–7.0)
Neutrophils: 53 %
Platelets: 79 x10E3/uL — CL (ref 150–450)
RBC: 4.51 x10E6/uL (ref 4.14–5.80)
RDW: 13.4 % (ref 11.6–15.4)
WBC: 4.9 x10E3/uL (ref 3.4–10.8)

## 2024-07-09 LAB — CMP14+EGFR
ALT: 12 IU/L (ref 0–44)
AST: 24 IU/L (ref 0–40)
Albumin: 4.3 g/dL (ref 3.9–4.9)
Alkaline Phosphatase: 98 IU/L (ref 47–123)
BUN/Creatinine Ratio: 6 — ABNORMAL LOW (ref 10–24)
BUN: 7 mg/dL — ABNORMAL LOW (ref 8–27)
Bilirubin Total: 0.9 mg/dL (ref 0.0–1.2)
CO2: 22 mmol/L (ref 20–29)
Calcium: 9.2 mg/dL (ref 8.6–10.2)
Chloride: 98 mmol/L (ref 96–106)
Creatinine, Ser: 1.09 mg/dL (ref 0.76–1.27)
Globulin, Total: 3.7 g/dL (ref 1.5–4.5)
Glucose: 75 mg/dL (ref 70–99)
Potassium: 4.2 mmol/L (ref 3.5–5.2)
Sodium: 136 mmol/L (ref 134–144)
Total Protein: 8 g/dL (ref 6.0–8.5)
eGFR: 75 mL/min/1.73 (ref >59)

## 2024-07-09 LAB — LIPID PANEL
Chol/HDL Ratio: 3.8 ratio (ref 0.0–5.0)
Cholesterol, Total: 150 mg/dL (ref 100–199)
HDL: 40 mg/dL (ref >39)
LDL Chol Calc (NIH): 99 mg/dL (ref 0–99)
Triglycerides: 55 mg/dL (ref 0–149)
VLDL Cholesterol Cal: 11 mg/dL (ref 5–40)

## 2024-07-09 LAB — PSA: Prostate Specific Ag, Serum: 2 ng/mL (ref 0.0–4.0)

## 2024-07-10 ENCOUNTER — Other Ambulatory Visit: Payer: Self-pay | Admitting: Family Medicine

## 2024-07-10 DIAGNOSIS — G8929 Other chronic pain: Secondary | ICD-10-CM

## 2024-07-24 ENCOUNTER — Ambulatory Visit (INDEPENDENT_AMBULATORY_CARE_PROVIDER_SITE_OTHER): Payer: Medicare (Managed Care) | Admitting: Student

## 2024-07-24 ENCOUNTER — Encounter (HOSPITAL_COMMUNITY): Payer: Medicare (Managed Care) | Admitting: Student

## 2024-07-24 DIAGNOSIS — G2119 Other drug induced secondary parkinsonism: Secondary | ICD-10-CM

## 2024-07-24 DIAGNOSIS — Z79899 Other long term (current) drug therapy: Secondary | ICD-10-CM | POA: Diagnosis not present

## 2024-07-24 DIAGNOSIS — F200 Paranoid schizophrenia: Secondary | ICD-10-CM

## 2024-07-24 DIAGNOSIS — G250 Essential tremor: Secondary | ICD-10-CM

## 2024-07-24 DIAGNOSIS — F411 Generalized anxiety disorder: Secondary | ICD-10-CM

## 2024-07-24 MED ORDER — TRAZODONE HCL 300 MG PO TABS: 300.0000 mg | ORAL_TABLET | Freq: Every evening | ORAL | 2 refills | Status: DC

## 2024-07-24 MED ORDER — QUETIAPINE FUMARATE 100 MG PO TABS: 100.0000 mg | ORAL_TABLET | Freq: Every day | ORAL | 2 refills | Status: DC

## 2024-07-24 MED ORDER — PROPRANOLOL HCL 20 MG PO TABS: 20.0000 mg | ORAL_TABLET | Freq: Two times a day (BID) | ORAL | 2 refills | Status: DC

## 2024-07-24 NOTE — Progress Notes (Signed)
 [facility] MD Outpatient Progress Note  07/24/2024 Curtis Mcdonald  MRN:  [id number]  Assessment:  Curtis Mcdonald presents for follow-up evaluation.  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  His physical exam has been relatively stable and there have not been medication changes for the past few months.  Physical exam about the same or modestly improved today.  Slight cogwheeling bilaterally, no upper extremity tremulousness, mild tardive movements of the tongue that emerge with reinforcement techniques.  Patient does report that his tremor comes and goes.   No medication changes.  Follow-up in 2 months.   Identifying Information: Curtis Mcdonald is a 68 y.o. male with a history of schizophrenia and anxiety who is an established patient with [facility] for treatment of schizophrenia.  Plan:  # Schizophrenia  Anxiety  tardive dyskinesia Interventions: - Continue Seroquel  100 mg nightly -- Continue Buspar  10 mg TID -Patient reports constipation, encouraged the patient to use MiraLAX  #Long term use of antipsychotic medication - EKG July 2024 with NSR QTc of 474.  EKG not repeated at most recent PCP visit. - Lipids unremarkable in July 2024 - A1c 5.2 in July 2024 - AIMS of 0 on 11/01/2022, 01/24/2023; AIMS of 3 on 07/04/2023 and 08/16/2023 for tongue movements ; AIMS 0 on 10/11/2023 ; parkinsonism noted on 12/10/2023 ; parkinsonism significantly improved on 01/09/2024.  02/03/2024 exam showing parkinsonism similar to previous visit.  No tardive tongue movements.  Exam on 5/29, no tardive movements noted, though his tongue protrudes from his mouth at various times.  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  He was started on Ingrezza  for TD shortly thereafter in early 2025.  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  He improved significantly after discontinuation of the medication and switch from Latuda  to  Seroquel . Believe patient has some predisposition to parkinsonism.  - 8/15, only has very mild tardive movements of his tongue, only apparent with reinforcement. AIMS 1.  Upper extremity tremulousness minimal, only apparent with reinforcement.  # Sleep Past medication trials: unknown Status of problem: stable Interventions: -- Continue trazodone  300 mg nightly  # Tremor, most likely essential tremor Past medication trials: none Interventions:  -- Continue propranolol  20 mg twice daily.  Patient was given contact information for behavioral health clinic and was instructed to call 911 for emergencies.   Subjective:  Chief Complaint:  Chief Complaint  Patient presents with   Medication Refill    Interval History:  Patient feels he is doing well.  He still spends most of his time alone at home reading the Bible.  He is involved in church.  He says that his sister visited recently.  He reports consistent low level symptoms of schizophrenia as described previously: A voice he occasionally hears that says be on the lookout, also reporting occasionally wondering what people are up to when he is walking around the neighborhood.  He denies issues with depression.  I did discuss PACE with him, but he was not interested.   Visit Diagnosis:    ICD-10-CM   1. Paranoid schizophrenia (HCC)  F20.0     2. Long term current use of antipsychotic medication  Z79.899     3. 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             Past Psychiatric History:  Diagnoses: schizophrenia, anxiety, cocaine abuse  Medication trials: unknown Hospitalizations: denies Suicide attempts: denies Hx of abuse: denies Substance use:   --  Denies use of cannabis, cocaine (last used in 2004), or illicit drug use  -- Etoh: denies  -- Tobacco: denies  -- Caffeine: 1 cup once a week  Past Medical History:  Past Medical History:  Diagnosis Date   Anxiety    Chronic hepatitis C without hepatic coma (HCC)  02/04/2023   Paranoid schizophrenia (HCC)    Stroke (HCC) 2017   Vaccine counseling 08/01/2023    Past Surgical History:  Procedure Laterality Date   COLONOSCOPY  2013   WISDOM TOOTH EXTRACTION      Family Psychiatric History: denies  Family History:  Family History  Problem Relation Age of Onset   CAD Neg Hx    Diabetes Neg Hx    Colon cancer Neg Hx    Colon polyps Neg Hx    Esophageal cancer Neg Hx    Rectal cancer Neg Hx    Stomach cancer Neg Hx     Social History:  Social History   Socioeconomic History   Marital status: Single    Spouse name: Not on file   Number of children: Not on file   Years of education: Not on file   Highest education level: GED or equivalent  Occupational History   Occupation: Atm assembly    Comment: [employer]  Tobacco Use   Smoking status: Never   Smokeless tobacco: Never  Vaping Use   Vaping status: Never Used  Substance and Sexual Activity   Alcohol use: No    Comment: fomer    Drug use: No   Sexual activity: Not Currently    Birth control/protection: Abstinence, None  Other Topics Concern   Not on file  Social History Narrative   Not on file   Social Drivers of Health   Financial Resource Strain: Low Risk  (07/04/2024)   Overall Financial Resource Strain (CARDIA)    Difficulty of Paying Living Expenses: Not hard at all  Food Insecurity: No Food Insecurity (07/04/2024)   Hunger Vital Sign    Worried About Running Out of Food in the Last Year: Never true    Ran Out of Food in the Last Year: Never true  Transportation Needs: No Transportation Needs (07/04/2024)   PRAPARE - Administrator, Civil Service (Medical): No    Lack of Transportation (Non-Medical): No  Physical Activity: Insufficiently Active (07/04/2024)   Exercise Vital Sign    Days of Exercise per Week: 2 days    Minutes of Exercise per Session: 30 min  Stress: No Stress Concern Present (07/04/2024)   [employer] of Occupational Health -  Occupational Stress Questionnaire    Feeling of Stress: Only a little  Social Connections: Moderately Isolated (07/04/2024)   Social Connection and Isolation Panel    Frequency of Communication with Friends and Family: Never    Frequency of Social Gatherings with Friends and Family: Once a week    Attends Religious Services: More than 4 times per year    Active Member of [employer] or Organizations: Yes    Attends Engineer, structural: More than 4 times per year    Marital Status: Separated    Allergies: No Known Allergies  Current Medications: Current Outpatient Medications  Medication Sig Dispense Refill   busPIRone  (BUSPAR ) 10 MG tablet Take 1 tablet (10 mg total) by mouth 3 (three) times daily. 90 tablet 2   Elastic Bandages & Supports (LUMBAR BACK BRACE/SUPPORT PAD) MISC 1 each by Does not apply route daily. PLEASE FAX TO [phone] along  with demographics and insurance information 1 each 0   meloxicam  (MOBIC ) 7.5 MG tablet TAKE 1-2 TABLETS BY MOUTH DAILY 60 tablet 2   propranolol  (INDERAL ) 20 MG tablet Take 1 tablet (20 mg total) by mouth 2 (two) times daily. 60 tablet 2   QUEtiapine  (SEROQUEL ) 100 MG tablet Take 1 tablet (100 mg total) by mouth at bedtime. 30 tablet 2   Sofosbuvir -Velpatasvir  (EPCLUSA ) 400-100 MG TABS Take 1 tablet by mouth daily. (Patient not taking: Reported on 07/08/2024) 28 tablet 2   traMADol (ULTRAM) 50 MG tablet Take 1 tablet (50 mg total) by mouth every 8 (eight) hours as needed for severe pain (pain score 7-10). 30 tablet 0   trazodone  (DESYREL ) 300 MG tablet Take 1 tablet (300 mg total) by mouth at bedtime. 30 tablet 2   Vitamin D , Ergocalciferol , (DRISDOL ) 1.25 MG (50000 UNIT) CAPS capsule Take 1 capsule (50,000 Units total) by mouth once a week. 12 capsule 2   No current facility-administered medications for this visit.     Objective:  Psychiatric Specialty Exam: There were no vitals taken for this visit.There is no height or weight on file to  calculate BMI.  General Appearance: Casual and Fairly Groomed  Eye Contact:  Good  Speech:  Clear and Coherent and Normal Rate  Volume:  Normal  Mood:  good  Affect: Constricted  Thought Content: occasional and fleeting AH  Suicidal Thoughts:  No  Homicidal Thoughts:  No  Thought Process:  Goal Directed and Linear  Orientation:  Full (Time, Place, and Person)    Memory:  Grossly intact  Judgment:  Good  Insight:  Good  Concentration:  Concentration: Good  Recall:  NA  Fund of Knowledge: Good  Language: Good  Psychomotor Activity: Normal  Akathisia:  No  AIMS (if indicated): see above  Assets:  Communication Skills Desire for Improvement Housing Leisure Time Physical Health Transportation  ADL's:  Intact  Cognition: WNL  Sleep:  Good   Physical Exam Constitutional:      Appearance: the patient is not toxic-appearing.  Pulmonary:     Effort: Pulmonary effort is normal.  Neurological:      Mental Status: the patient is alert and oriented to person, place, and time.   Review of Systems  Respiratory:  Negative for shortness of breath.   Cardiovascular:  Negative for chest pain.  Gastrointestinal:  Negative for abdominal pain, constipation, diarrhea, nausea and vomiting.  Neurological:  Negative for headaches.    Metabolic Disorder Labs: Lab Results  Component Value Date   HGBA1C 5.3 06/22/2024   MPG 100 04/12/2016   No results found for: PROLACTIN Lab Results  Component Value Date   CHOL 150 07/08/2024   TRIG 55 07/08/2024   HDL 40 07/08/2024   CHOLHDL 3.8 07/08/2024   VLDL 14 04/12/2016   LDLCALC 99 07/08/2024   LDLCALC 62 04/10/2023   Lab Results  Component Value Date   TSH 0.330 (L) 04/12/2016    Therapeutic Level Labs: No results found for: LITHIUM No results found for: VALPROATE No results found for: CBMZ  Screenings:  AIMS    Flowsheet Row Clinical Support from 10/06/2020 in [facility]  AIMS Total  Score 0   GAD-7    Flowsheet Row Office Visit from 07/08/2024 in [facility] [place] - A Dept Of [place]. [facility] Office Visit from 06/14/2023 in [facility] [place] - A Dept Of [place]. [facility] Office  Visit from 04/10/2023 in [facility] [place] - A Dept Of [place] [facility]. [facility] Office Visit from 01/02/2023 in [facility] [place] - A Dept Of [place]. [facility] Office Visit from 11/20/2022 in [facility] [place] - A Dept Of [place] [facility]. [facility]  Total GAD-7 Score 4 6 7 6 10    PHQ2-9    Flowsheet Row Office Visit from 07/08/2024 in [facility] [place] - A Dept Of [place]. [facility] Office Visit from 01/07/2024 in [facility] - A Dept Of [place]. [facility] Office Visit from 10/21/2023 in [facility] - A Dept Of [place]. [facility] Office Visit from 06/14/2023 in [facility] [place] - A Dept Of [place] [facility]. [facility] Office Visit from 04/10/2023 in [facility] [place] - A Dept Of [place]. [facility]  PHQ-2 Total Score 0 0 0 1 5  PHQ-9 Total Score 0 -- -- 5 6   Flowsheet Row ED from 05/13/2022 in [facility] Emergency Department at [facility] Office Visit from 02/01/2022 in [facility] Clinical Support from 02/20/2021 in [facility]  C-SSRS RISK CATEGORY No Risk No Risk No Risk    Collaboration of Care: none   I provided 30 minutes of face-to-face time during this encounter.  Karleen Kaufmann, MD 07/24/2024, 3:15 PM

## 2024-07-31 ENCOUNTER — Ambulatory Visit: Payer: Medicare (Managed Care) | Admitting: Nurse Practitioner

## 2024-09-18 ENCOUNTER — Encounter (HOSPITAL_COMMUNITY): Payer: Self-pay

## 2024-09-18 ENCOUNTER — Ambulatory Visit (INDEPENDENT_AMBULATORY_CARE_PROVIDER_SITE_OTHER): Payer: Medicare (Managed Care) | Admitting: Student

## 2024-09-18 DIAGNOSIS — F411 Generalized anxiety disorder: Secondary | ICD-10-CM | POA: Diagnosis not present

## 2024-09-18 DIAGNOSIS — F200 Paranoid schizophrenia: Secondary | ICD-10-CM

## 2024-09-18 DIAGNOSIS — G250 Essential tremor: Secondary | ICD-10-CM | POA: Diagnosis not present

## 2024-09-21 MED ORDER — BUSPIRONE HCL 10 MG PO TABS: 10.0000 mg | ORAL_TABLET | Freq: Three times a day (TID) | ORAL | 2 refills | Status: AC

## 2024-09-21 MED ORDER — QUETIAPINE FUMARATE 100 MG PO TABS: 100.0000 mg | ORAL_TABLET | Freq: Every day | ORAL | 2 refills | Status: AC

## 2024-09-21 MED ORDER — TRAZODONE HCL 300 MG PO TABS: 300.0000 mg | ORAL_TABLET | Freq: Every evening | ORAL | 2 refills | Status: AC

## 2024-09-21 MED ORDER — PROPRANOLOL HCL 20 MG PO TABS: 20.0000 mg | ORAL_TABLET | Freq: Two times a day (BID) | ORAL | 2 refills | Status: AC

## 2024-09-21 NOTE — Progress Notes (Signed)
 [facility] MD Outpatient Progress Note  09/18/2024 Curtis Mcdonald  MRN:  [id number]  Assessment:  Curtis Mcdonald presents for follow-up evaluation.  Routine follow-up for this patient with schizophrenia and prior drug-induced parkinsonism.  Physical exam demonstrates continued improvement, quicker gait and no apparent tremor.  Continue regimen as is and follow-up in 3 months.   Identifying Information: Curtis Mcdonald is a 68 y.o. male with a history of schizophrenia and anxiety who is an established patient with [facility] for treatment of schizophrenia.  Plan:  # Schizophrenia  Anxiety  tardive dyskinesia Interventions: - Continue Seroquel  100 mg nightly -- Continue Buspar  10 mg TID -Patient reports constipation, encouraged the patient to use MiraLAX  #Long term use of antipsychotic medication - EKG July 2024 with NSR QTc of 474.  EKG not repeated at most recent PCP visit. - Lipids unremarkable in July 2024 - A1c 5.2 in July 2024 - AIMS of 0 on 11/01/2022, 01/24/2023; AIMS of 3 on 07/04/2023 and 08/16/2023 for tongue movements ; AIMS 0 on 10/11/2023 ; parkinsonism noted on 12/10/2023 ; parkinsonism significantly improved on 01/09/2024.  02/03/2024 exam showing parkinsonism similar to previous visit.  No tardive tongue movements.  Exam on 5/29, no tardive movements noted, though his tongue protrudes from his mouth at various times.  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  He was started on Ingrezza  for TD shortly thereafter in early 2025.  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  He improved significantly after discontinuation of the medication and switch from Latuda  to Seroquel . Believe patient has some predisposition to parkinsonism.  - 8/15, only has very mild tardive movements of his tongue, only apparent with reinforcement. AIMS 1.  Upper extremity tremulousness minimal, only apparent with reinforcement.  # Sleep Past  medication trials: unknown Status of problem: stable Interventions: -- Continue trazodone  300 mg nightly  # Tremor, most likely essential tremor Past medication trials: none Interventions:  -- Continue propranolol  20 mg twice daily.  Patient was given contact information for behavioral health clinic and was instructed to call 911 for emergencies.   Subjective:  Chief Complaint:  Chief Complaint  Patient presents with   Follow-up    Interval History:  Patient reports social engagement at church and that he spends most of his time reading the Bible.  He reports a euthymic mood and denies experiencing suicidal thoughts.  He reports occasional paranoia related to strangers that he sees out in public.  The intensity is low and he does not find this bothersome.  No auditory hallucinations.   Visit Diagnosis:    ICD-10-CM   1. Paranoid schizophrenia (HCC)  F20.0     2. Essential tremor  G25.0     3. Generalized anxiety disorder  F41.1              Past Psychiatric History:  Diagnoses: schizophrenia, anxiety, cocaine abuse  Medication trials: unknown Hospitalizations: denies Suicide attempts: denies Hx of abuse: denies Substance use:   -- Denies use of cannabis, cocaine (last used in 2004), or illicit drug use  -- Etoh: denies  -- Tobacco: denies  -- Caffeine: 1 cup once a week  Past Medical History:  Past Medical History:  Diagnosis Date   Anxiety    Chronic hepatitis C without hepatic coma (HCC) 02/04/2023   Paranoid schizophrenia (HCC)    Stroke (HCC) 2017   Vaccine counseling 08/01/2023    Past Surgical History:  Procedure Laterality  Date   COLONOSCOPY  2013   WISDOM TOOTH EXTRACTION      Family Psychiatric History: denies  Family History:  Family History  Problem Relation Age of Onset   CAD Neg Hx    Diabetes Neg Hx    Colon cancer Neg Hx    Colon polyps Neg Hx    Esophageal cancer Neg Hx    Rectal cancer Neg Hx    Stomach cancer Neg Hx      Social History:  Social History   Socioeconomic History   Marital status: Single    Spouse name: Not on file   Number of children: Not on file   Years of education: Not on file   Highest education level: GED or equivalent  Occupational History   Occupation: Atm assembly    Comment: [employer]  Tobacco Use   Smoking status: Never   Smokeless tobacco: Never  Vaping Use   Vaping status: Never Used  Substance and Sexual Activity   Alcohol use: No    Comment: fomer    Drug use: No   Sexual activity: Not Currently    Birth control/protection: Abstinence, None  Other Topics Concern   Not on file  Social History Narrative   Not on file   Social Drivers of Health   Tobacco Use: Low Risk (07/08/2024)   Patient History    Smoking Tobacco Use: Never    Smokeless Tobacco Use: Never    Passive Exposure: Not on file  Financial Resource Strain: Low Risk (07/04/2024)   Overall Financial Resource Strain (CARDIA)    Difficulty of Paying Living Expenses: Not hard at all  Food Insecurity: No Food Insecurity (07/04/2024)   Epic    Worried About Radiation Protection Practitioner of Food in the Last Year: Never true    Ran Out of Food in the Last Year: Never true  Transportation Needs: No Transportation Needs (07/04/2024)   Epic    Lack of Transportation (Medical): No    Lack of Transportation (Non-Medical): No  Physical Activity: Insufficiently Active (07/04/2024)   Exercise Vital Sign    Days of Exercise per Week: 2 days    Minutes of Exercise per Session: 30 min  Stress: No Stress Concern Present (07/04/2024)   [employer] of Occupational Health - Occupational Stress Questionnaire    Feeling of Stress: Only a little  Social Connections: Moderately Isolated (07/04/2024)   Social Connection and Isolation Panel    Frequency of Communication with Friends and Family: Never    Frequency of Social Gatherings with Friends and Family: Once a week    Attends Religious Services: More than 4 times per year     Active Member of [employer] or Organizations: Yes    Attends Banker Meetings: More than 4 times per year    Marital Status: Separated  Depression (PHQ2-9): Low Risk (07/08/2024)   Depression (PHQ2-9)    PHQ-2 Score: 0  Alcohol Screen: Not on file  Housing: Low Risk (07/04/2024)   Epic    Unable to Pay for Housing in the Last Year: No    Number of Times Moved in the Last Year: 0    Homeless in the Last Year: No  Utilities: Not on file  Health Literacy: Not on file    Allergies: No Known Allergies  Current Medications: Current Outpatient Medications  Medication Sig Dispense Refill   busPIRone  (BUSPAR ) 10 MG tablet Take 1 tablet (10 mg total) by mouth 3 (three) times daily. 90 tablet 2  Elastic Bandages & Supports (LUMBAR BACK BRACE/SUPPORT PAD) MISC 1 each by Does not apply route daily. PLEASE FAX TO [phone] along with demographics and insurance information 1 each 0   meloxicam  (MOBIC ) 7.5 MG tablet TAKE 1-2 TABLETS BY MOUTH DAILY 60 tablet 2   propranolol  (INDERAL ) 20 MG tablet Take 1 tablet (20 mg total) by mouth 2 (two) times daily. 60 tablet 2   QUEtiapine  (SEROQUEL ) 100 MG tablet Take 1 tablet (100 mg total) by mouth at bedtime. 30 tablet 2   Sofosbuvir -Velpatasvir  (EPCLUSA ) 400-100 MG TABS Take 1 tablet by mouth daily. (Patient not taking: Reported on 07/08/2024) 28 tablet 2   traMADol  (ULTRAM ) 50 MG tablet Take 1 tablet (50 mg total) by mouth every 8 (eight) hours as needed for severe pain (pain score 7-10). 30 tablet 0   trazodone  (DESYREL ) 300 MG tablet Take 1 tablet (300 mg total) by mouth at bedtime. 30 tablet 2   Vitamin D , Ergocalciferol , (DRISDOL ) 1.25 MG (50000 UNIT) CAPS capsule Take 1 capsule (50,000 Units total) by mouth once a week. 12 capsule 2   No current facility-administered medications for this visit.     Objective:  Psychiatric Specialty Exam: There were no vitals taken for this visit.There is no height or weight on file to calculate BMI.   General Appearance: Casual and Fairly Groomed  Eye Contact:  Good  Speech:  Clear and Coherent and Normal Rate  Volume:  Normal  Mood:  good  Affect: Constricted  Thought Content: Normal  Suicidal Thoughts:  No  Homicidal Thoughts:  No  Thought Process:  Goal Directed and Linear  Orientation:  Full (Time, Place, and Person)    Memory:  Grossly intact  Judgment:  Good  Insight:  Good  Concentration:  Concentration: Good  Recall:  NA  Fund of Knowledge: Good  Language: Good  Psychomotor Activity: Normal  Akathisia:  No  AIMS (if indicated): see above  Assets:  Communication Skills Desire for Improvement Housing Leisure Time Physical Health Transportation  ADL's:  Intact  Cognition: WNL  Sleep:  Good   Physical Exam Constitutional:      Appearance: the patient is not toxic-appearing.  Pulmonary:     Effort: Pulmonary effort is normal.  Neurological:      Mental Status: the patient is alert and oriented to person, place, and time.   Review of Systems  Respiratory:  Negative for shortness of breath.   Cardiovascular:  Negative for chest pain.  Gastrointestinal:  Negative for abdominal pain, constipation, diarrhea, nausea and vomiting.  Neurological:  Negative for headaches.    Metabolic Disorder Labs: Lab Results  Component Value Date   HGBA1C 5.3 06/22/2024   MPG 100 04/12/2016   No results found for: PROLACTIN Lab Results  Component Value Date   CHOL 150 07/08/2024   TRIG 55 07/08/2024   HDL 40 07/08/2024   CHOLHDL 3.8 07/08/2024   VLDL 14 04/12/2016   LDLCALC 99 07/08/2024   LDLCALC 62 04/10/2023   Lab Results  Component Value Date   TSH 0.330 (L) 04/12/2016    Therapeutic Level Labs: No results found for: LITHIUM No results found for: VALPROATE No results found for: CBMZ  Screenings:  AIMS    Flowsheet Row Clinical Support [facility] 10/06/2020 in [facility]  AIMS Total Score 0   GAD-7    Flowsheet  Row Office Visit from 07/08/2024 in [facility] [place] - A Dept Of [place]. [facility] Office  Visit from 06/14/2023 in [facility] [place] - A Dept Of [place]. [facility] Office Visit from 04/10/2023 in [facility] [place] - A Dept Of [place] [facility]. [facility] Office Visit from 01/02/2023 in [facility] [place] - A Dept Of [place]. [facility] Office Visit from 11/20/2022 in [facility] [place] - A Dept Of [place]. [facility]  Total GAD-7 Score 4 6 7 6 10    PHQ2-9    Flowsheet Row Office Visit from 07/08/2024 in [facility] [place] - A Dept Of [place]. [facility] Office Visit from 01/07/2024 in [facility] [place] - A Dept Of [place]. [facility] Office Visit from 10/21/2023 in [facility] - A Dept Of [place]. [facility] Office Visit from 06/14/2023 in [facility] [place] - A Dept Of [place] [facility]. [facility] Office Visit from 04/10/2023 in [facility] [place] - A Dept Of [place]. [facility]  PHQ-2 Total Score 0 0 0 1 5  PHQ-9 Total Score 0 -- -- 5 6   Flowsheet Row ED from 05/13/2022 in [facility] Emergency Department at [facility] Office Visit from 02/01/2022 in [facility] Clinical Support from 02/20/2021 in [facility]  C-SSRS RISK CATEGORY No Risk No Risk No Risk    Collaboration of Care: none   I provided 30 minutes of face-to-face time during this encounter.  Karleen Kaufmann, MD 09/21/2024, 11:06 AM

## 2024-10-12 ENCOUNTER — Ambulatory Visit: Payer: Medicare (Managed Care) | Admitting: Nurse Practitioner

## 2024-12-08 ENCOUNTER — Ambulatory Visit: Payer: Self-pay

## 2024-12-11 ENCOUNTER — Encounter (HOSPITAL_COMMUNITY): Payer: Medicare (Managed Care) | Admitting: Student

## 2025-07-09 ENCOUNTER — Ambulatory Visit: Payer: Medicare (Managed Care) | Admitting: Nurse Practitioner
